# Patient Record
Sex: Male | Born: 1985 | Race: Asian | Hispanic: No | Smoking: Former smoker
Health system: Southern US, Community
[De-identification: ages and names within clinical notes are randomized; demographics above are authoritative.]

## PROBLEM LIST (undated history)

## (undated) DIAGNOSIS — G4730 Sleep apnea, unspecified: Secondary | ICD-10-CM

## (undated) DIAGNOSIS — I639 Cerebral infarction, unspecified: Secondary | ICD-10-CM

## (undated) DIAGNOSIS — T79A19A Traumatic compartment syndrome of unspecified upper extremity, initial encounter: Secondary | ICD-10-CM

## (undated) HISTORY — PX: DECOMPRESSION FASCIOTOMY FOREARM: SUR401

---

## 2008-04-13 ENCOUNTER — Emergency Department (HOSPITAL_COMMUNITY): Admission: EM | Admit: 2008-04-13 | Discharge: 2008-04-13 | Payer: Self-pay | Admitting: Family Medicine

## 2014-11-30 ENCOUNTER — Ambulatory Visit (HOSPITAL_COMMUNITY)
Admission: RE | Admit: 2014-11-30 | Discharge: 2014-11-30 | Disposition: A | Discharge status: Home / Self Care | Payer: BLUE CROSS/BLUE SHIELD | Source: Ambulatory Visit | Attending: Nurse Practitioner | Admitting: Nurse Practitioner

## 2014-11-30 DIAGNOSIS — Z31448 Encounter for other genetic testing of male for procreative management: Secondary | ICD-10-CM | POA: Insufficient documentation

## 2014-12-20 ENCOUNTER — Other Ambulatory Visit (HOSPITAL_COMMUNITY): Payer: Self-pay | Admitting: Maternal and Fetal Medicine

## 2015-03-14 ENCOUNTER — Emergency Department (HOSPITAL_COMMUNITY)
Admission: EM | Admit: 2015-03-14 | Discharge: 2015-03-14 | Disposition: A | Discharge status: Home / Self Care | Payer: BLUE CROSS/BLUE SHIELD | Source: Home / Self Care | Attending: Family Medicine | Admitting: Family Medicine

## 2015-03-14 ENCOUNTER — Encounter (HOSPITAL_COMMUNITY): Payer: Self-pay | Admitting: Family Medicine

## 2015-03-14 DIAGNOSIS — J029 Acute pharyngitis, unspecified: Secondary | ICD-10-CM

## 2015-03-14 DIAGNOSIS — J039 Acute tonsillitis, unspecified: Secondary | ICD-10-CM | POA: Diagnosis not present

## 2015-03-14 LAB — POCT RAPID STREP A: STREPTOCOCCUS, GROUP A SCREEN (DIRECT): NEGATIVE

## 2015-03-14 MED ORDER — AMOXICILLIN 500 MG PO CAPS: 500.0000 mg | ORAL_CAPSULE | Freq: Two times a day (BID) | ORAL | Status: DC

## 2015-03-14 MED ORDER — DEXAMETHASONE SODIUM PHOSPHATE 10 MG/ML IJ SOLN: 10.0000 mg | Freq: Once | INTRAMUSCULAR | Status: DC

## 2015-03-14 NOTE — ED Notes (Signed)
Patient complains of sore throat and fever for the last four days Patient rates his pain an "8" on the pain scale

## 2015-03-14 NOTE — Discharge Instructions (Signed)
You have an infection that will require antibiotics Please take them as prescribed You were given an shot of steroids to help your throat swelling and pain Please use tylenol or ibuprofen for pain and fever.  ????? ???????????? ?????? ????? ???? ?? ??????? ? ????????? ????? ????????? ??? ????? ??????? ?? ?? ?? ????? ????? ??????? ? ???? ??? ???? ?????? ???? ????? ? ????? ???? tylenol ?? ibuprofen ?????? ??????????

## 2015-03-14 NOTE — ED Provider Notes (Signed)
CSN: [id number]     Arrival date & time 03/14/15  1739 History   First MD Initiated Contact with Patient 03/14/15 1817     Chief Complaint  Patient presents with  . Sore Throat   (Consider location/radiation/quality/duration/timing/severity/associated sxs/prior Treatment) HPI  Throat pain: 4 days. Fevers. No runny. Has not tried any medicine. No expansion some dysphagia with food. Poor by mouth intake over the last several days. General ill feeling. Denies any chest pain, shortness of breath, palpitations, runny nose, cough, congestion. Symptoms are constant and getting worse.  History reviewed. No pertinent past medical history. History reviewed. No pertinent past surgical history. Family History  Problem Relation Age of Onset  . Diabetes Neg Hx   . Cancer Neg Hx   . Heart failure Neg Hx   . Hyperlipidemia Neg Hx   . Hypertension Neg Hx    Social History  Substance Use Topics  . Smoking status: Current Every Day Smoker -- 0.01 packs/day  . Smokeless tobacco: None  . Alcohol Use: None    Review of Systems Per HPI with all other pertinent systems negative.   Allergies  Review of patient's allergies indicates not on file.  Home Medications   Prior to Admission medications   Medication Sig Start Date End Date Taking? Authorizing Provider  amoxicillin (AMOXIL) 500 MG capsule Take 1 capsule (500 mg total) by mouth 2 (two) times daily. 03/14/15   Ozella Rocks, MD   BP 131/80 mmHg  Pulse 97  Temp(Src) 101.8 F (38.8 C) (Oral)  Resp 15  SpO2 99% Physical Exam Physical Exam  Constitutional: oriented to person, place, and time. appears well-developed and well-nourished. No distress.  HENT:  Head: Normocephalic and atraumatic.  Eyes: EOMI. PERRL.  Edematous posterior pharyngeal cavity and tonsils 2+ with exudate. Neck: Normal range of motion.  Cardiovascular: RRR, no m/r/g, 2+ distal pulses,  Pulmonary/Chest: Effort normal and breath sounds normal. No respiratory  distress.  Abdominal: Soft. Bowel sounds are normal. NonTTP, no distension.  Musculoskeletal: Normal range of motion. Non ttp, no effusion.  Neurological: alert and oriented to person, place, and time.  Skin: Skin is warm. No rash noted. non diaphoretic.  Psychiatric: normal mood and affect. behavior is normal. Judgment and thought content normal.   ED Course  Procedures (including critical care time) Labs Review Labs Reviewed - No data to display  Imaging Review No results found.   MDM   1. Tonsillitis   2. Pharyngitis    Decadron 10 mg IM given in clinic. Rapid strep negative. We'll send strep culture. Start amoxicillin.    Ozella Rocks, MD 03/14/15 [phone]

## 2015-03-17 LAB — CULTURE, GROUP A STREP: STREP A CULTURE: NEGATIVE

## 2015-03-18 NOTE — ED Notes (Signed)
Final report of strep test negaivw

## 2017-08-07 ENCOUNTER — Encounter (HOSPITAL_COMMUNITY): Payer: Self-pay

## 2017-08-07 ENCOUNTER — Emergency Department (HOSPITAL_COMMUNITY)
Admission: EM | Admit: 2017-08-07 | Discharge: 2017-08-07 | Disposition: A | Discharge status: Home / Self Care | Payer: Commercial Managed Care - PPO | Attending: Emergency Medicine | Admitting: Emergency Medicine

## 2017-08-07 ENCOUNTER — Emergency Department (HOSPITAL_COMMUNITY): Payer: Commercial Managed Care - PPO

## 2017-08-07 DIAGNOSIS — M549 Dorsalgia, unspecified: Secondary | ICD-10-CM | POA: Diagnosis present

## 2017-08-07 DIAGNOSIS — R3915 Urgency of urination: Secondary | ICD-10-CM | POA: Insufficient documentation

## 2017-08-07 DIAGNOSIS — R300 Dysuria: Secondary | ICD-10-CM | POA: Diagnosis not present

## 2017-08-07 DIAGNOSIS — R1032 Left lower quadrant pain: Secondary | ICD-10-CM | POA: Diagnosis not present

## 2017-08-07 DIAGNOSIS — F17210 Nicotine dependence, cigarettes, uncomplicated: Secondary | ICD-10-CM | POA: Insufficient documentation

## 2017-08-07 DIAGNOSIS — R8290 Unspecified abnormal findings in urine: Secondary | ICD-10-CM | POA: Insufficient documentation

## 2017-08-07 DIAGNOSIS — R3912 Poor urinary stream: Secondary | ICD-10-CM | POA: Insufficient documentation

## 2017-08-07 DIAGNOSIS — N200 Calculus of kidney: Secondary | ICD-10-CM | POA: Insufficient documentation

## 2017-08-07 LAB — URINALYSIS, ROUTINE W REFLEX MICROSCOPIC
Bilirubin Urine: NEGATIVE
Glucose, UA: NEGATIVE mg/dL
Ketones, ur: NEGATIVE mg/dL
Leukocytes, UA: NEGATIVE
NITRITE: NEGATIVE
PROTEIN: 100 mg/dL — AB
SPECIFIC GRAVITY, URINE: 1.025 (ref 1.005–1.030)
Squamous Epithelial / LPF: NONE SEEN
pH: 6 (ref 5.0–8.0)

## 2017-08-07 LAB — CBC WITH DIFFERENTIAL/PLATELET
BASOS ABS: 0.1 10*3/uL (ref 0.0–0.1)
Basophils Relative: 0 %
EOS PCT: 2 %
Eosinophils Absolute: 0.3 10*3/uL (ref 0.0–0.7)
HEMATOCRIT: 42.8 % (ref 39.0–52.0)
HEMOGLOBIN: 14.7 g/dL (ref 13.0–17.0)
LYMPHS ABS: 3 10*3/uL (ref 0.7–4.0)
LYMPHS PCT: 19 %
MCH: 28.8 pg (ref 26.0–34.0)
MCHC: 34.3 g/dL (ref 30.0–36.0)
MCV: 83.9 fL (ref 78.0–100.0)
Monocytes Absolute: 1.7 10*3/uL — ABNORMAL HIGH (ref 0.1–1.0)
Monocytes Relative: 11 %
NEUTROS ABS: 10.5 10*3/uL — AB (ref 1.7–7.7)
NEUTROS PCT: 68 %
Platelets: 288 10*3/uL (ref 150–400)
RBC: 5.1 MIL/uL (ref 4.22–5.81)
RDW: 12.9 % (ref 11.5–15.5)
WBC: 15.6 10*3/uL — AB (ref 4.0–10.5)

## 2017-08-07 LAB — COMPREHENSIVE METABOLIC PANEL
ALK PHOS: 88 U/L (ref 38–126)
ALT: 99 U/L — AB (ref 17–63)
AST: 56 U/L — ABNORMAL HIGH (ref 15–41)
Albumin: 3.7 g/dL (ref 3.5–5.0)
Anion gap: 7 (ref 5–15)
BILIRUBIN TOTAL: 1.2 mg/dL (ref 0.3–1.2)
BUN: 13 mg/dL (ref 6–20)
CALCIUM: 8.8 mg/dL — AB (ref 8.9–10.3)
CO2: 24 mmol/L (ref 22–32)
CREATININE: 1.5 mg/dL — AB (ref 0.61–1.24)
Chloride: 105 mmol/L (ref 101–111)
Glucose, Bld: 115 mg/dL — ABNORMAL HIGH (ref 65–99)
Potassium: 3.6 mmol/L (ref 3.5–5.1)
Sodium: 136 mmol/L (ref 135–145)
Total Protein: 8.1 g/dL (ref 6.5–8.1)

## 2017-08-07 LAB — LIPASE, BLOOD: LIPASE: 28 U/L (ref 11–51)

## 2017-08-07 IMAGING — CT CT RENAL STONE PROTOCOL
2 of 4 series · 16 of 46 positions shown, 18 images · non-contrast
Comparison: None.

CLINICAL DATA: Left back/ abdominal pain, dysuria

EXAM:
CT ABDOMEN AND PELVIS WITHOUT CONTRAST
TECHNIQUE: Multidetector CT imaging of the abdomen and pelvis was performed
following the standard protocol without IV contrast.

[Series 3: stone study 5.0 i30f 2 · axial · 0.72mm/px · z∈[+957,+1367]mm · 13 of 90 slices shown, 15 images]
[im 4/90  soft-tissue]
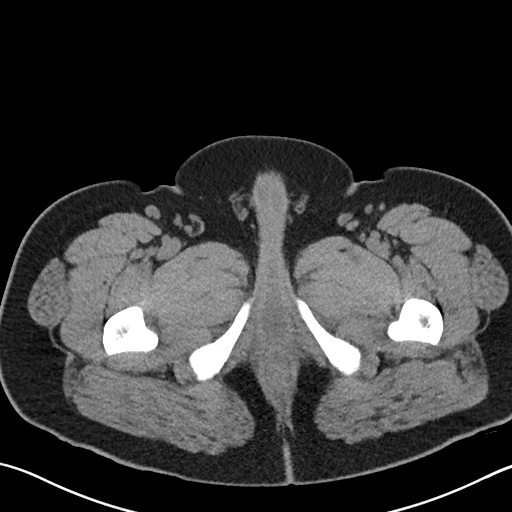
[im 4/90  bone]
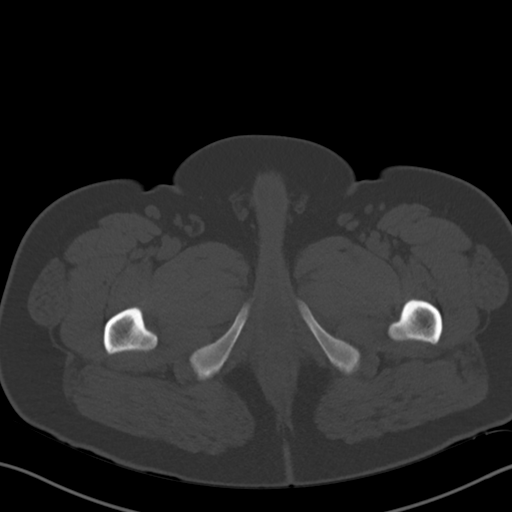
[im 11/90  soft-tissue]
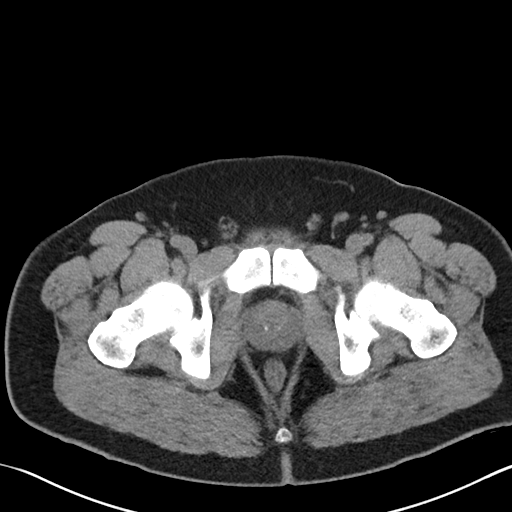
[im 18/90  soft-tissue]
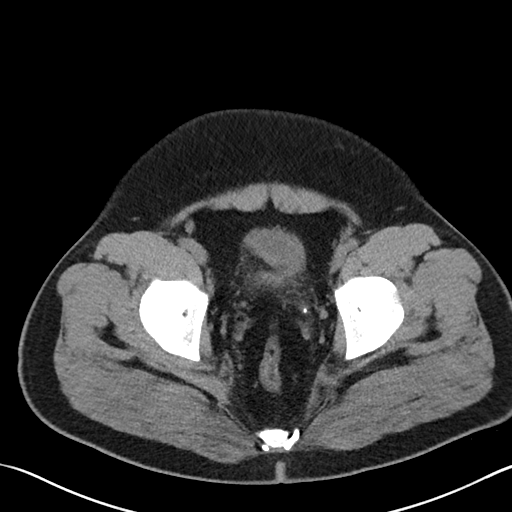
[im 24/90  soft-tissue]
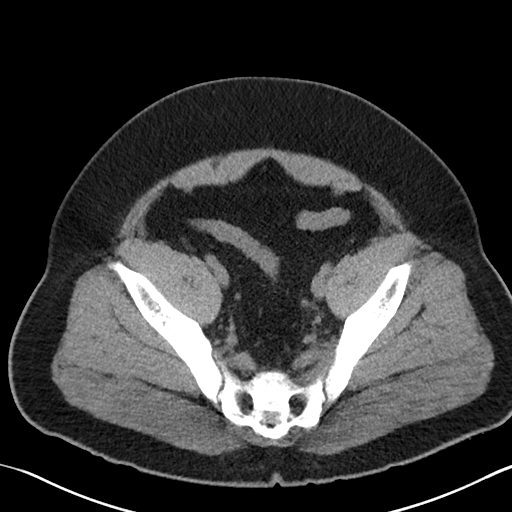
[im 31/90  soft-tissue]
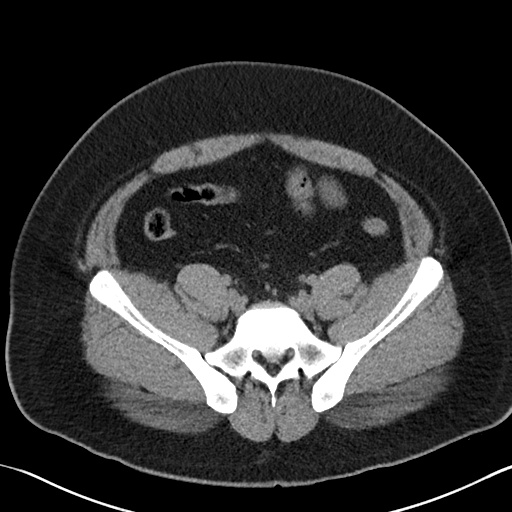
[im 38/90  soft-tissue]
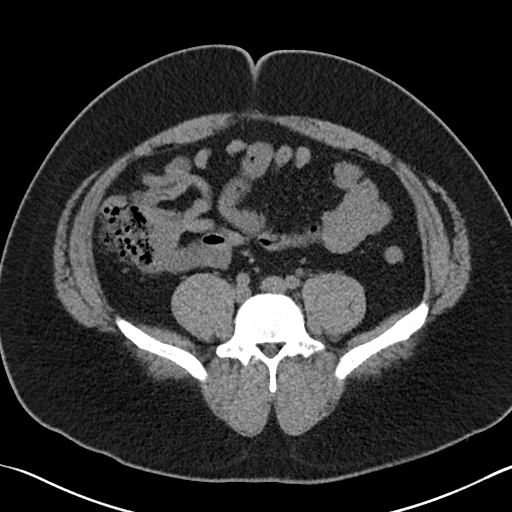
[im 45/90  soft-tissue]
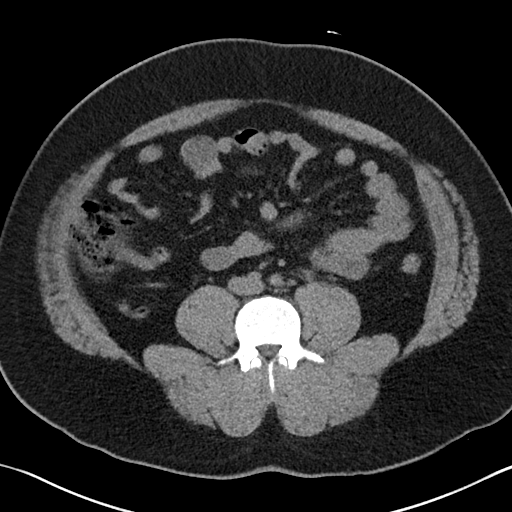
[im 52/90  soft-tissue]
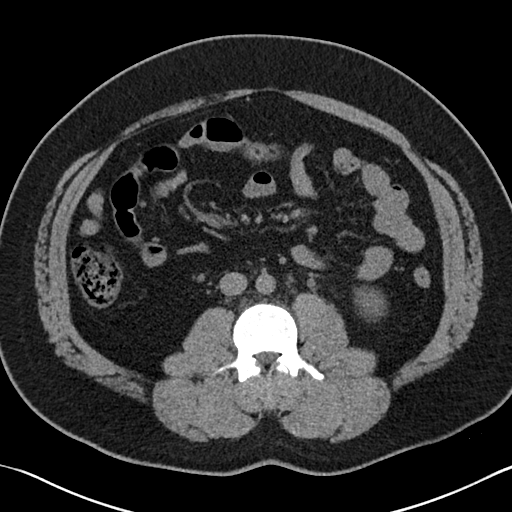
[im 59/90  soft-tissue]
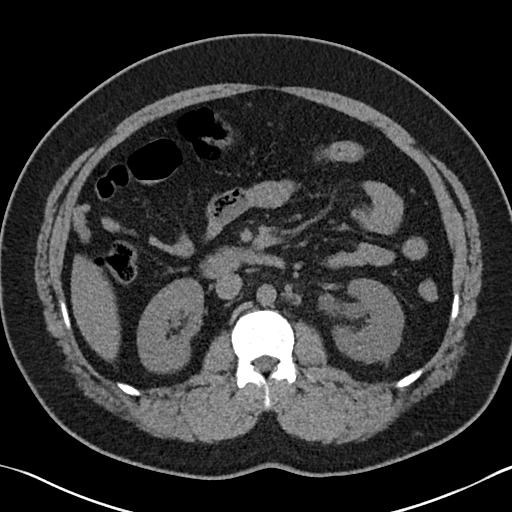
[im 59/90  bone]
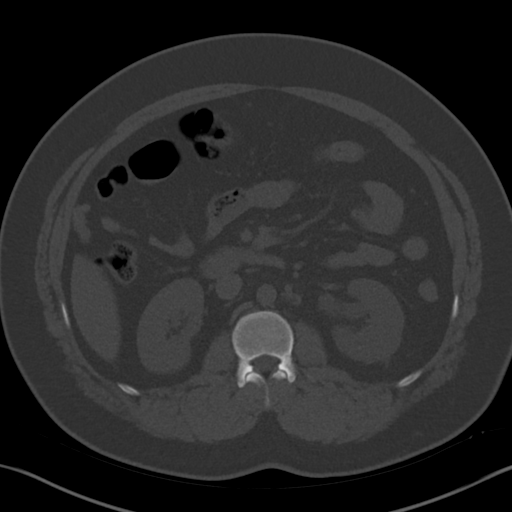
[im 66/90  soft-tissue]
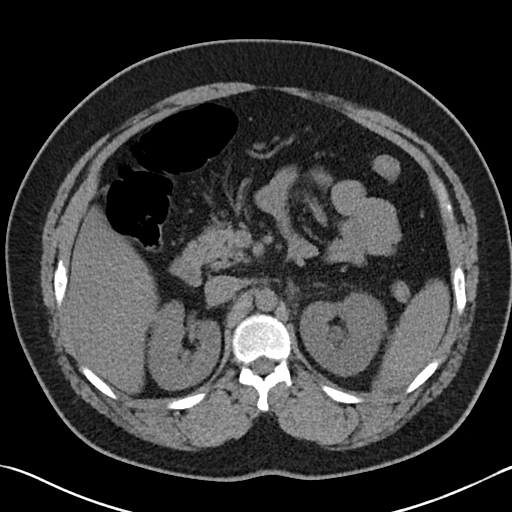
[im 72/90  soft-tissue]
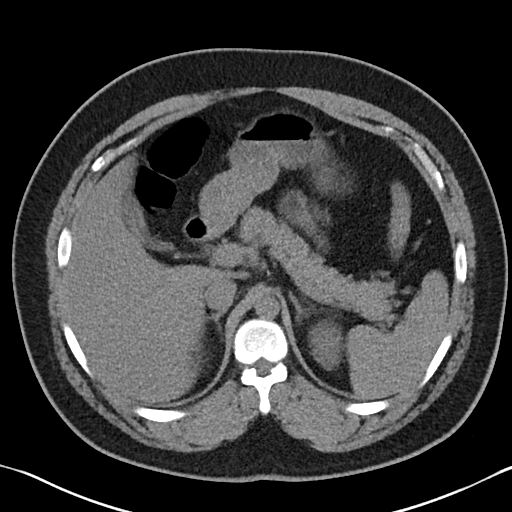
[im 79/90  soft-tissue]
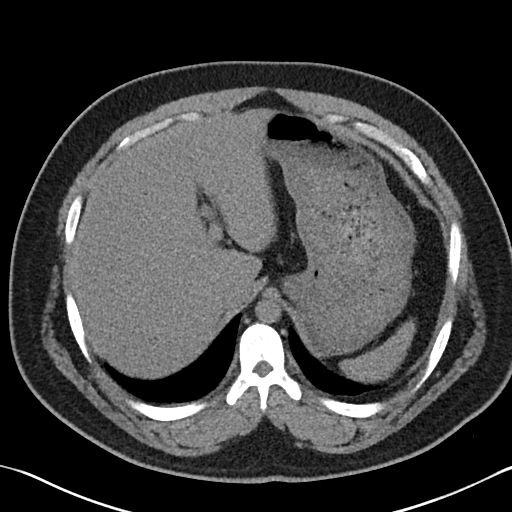
[im 86/90  soft-tissue]
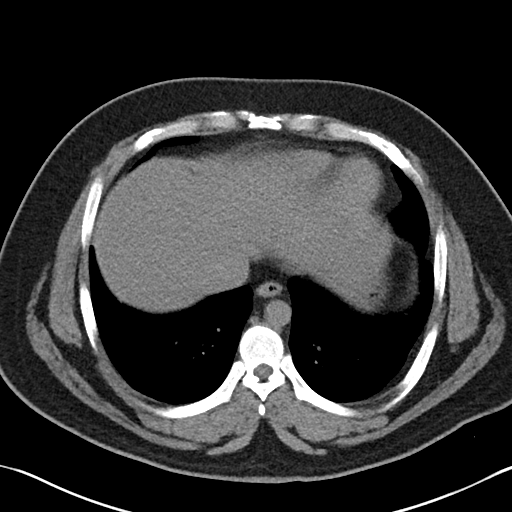

[Series 6: coronal soft tissue · coronal · 0.70mm/px · 3 of 101 slices shown]
[im 34/101  soft-tissue]
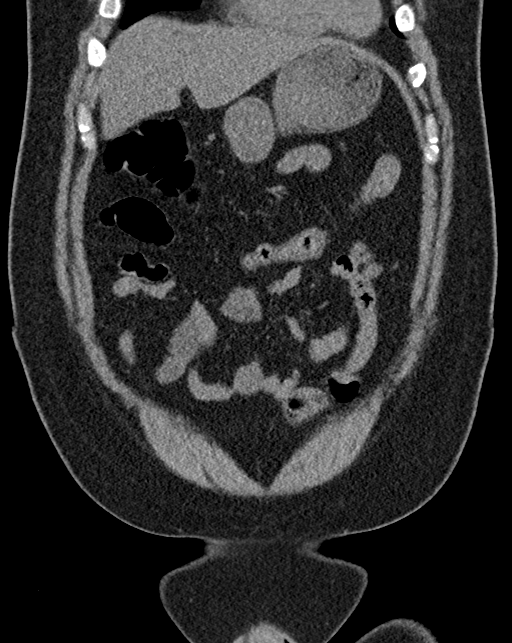
[im 45/101  soft-tissue]
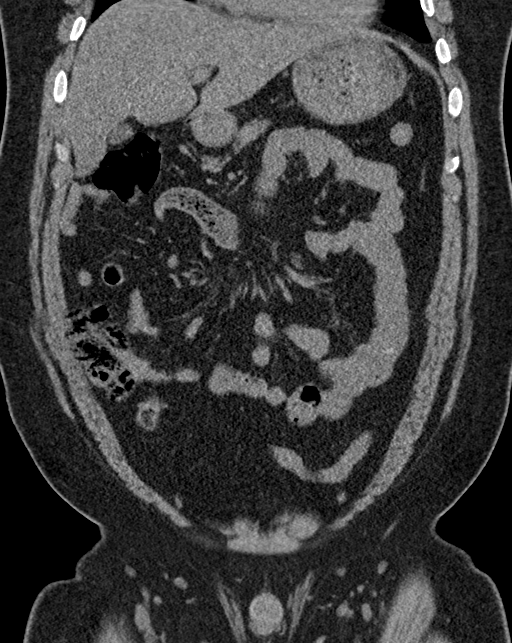
[im 56/101  soft-tissue]
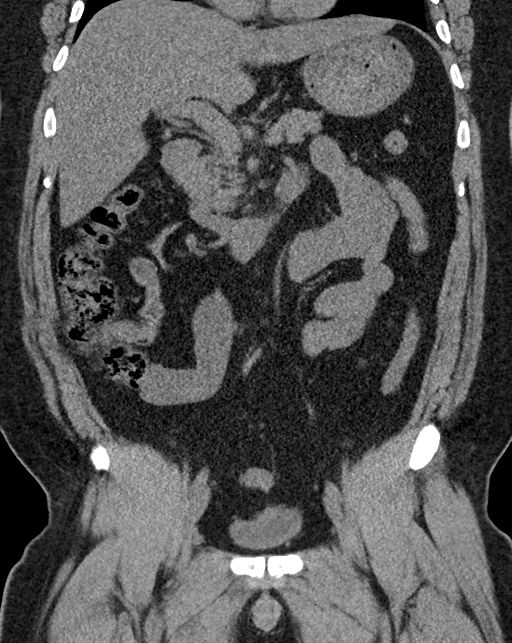

[16 of 46 positions shown; findings below may reference images not displayed]

FINDINGS: Lower chest: Lung bases are clear.

Hepatobiliary: Mild hepatic steatosis.

Gallbladder is underdistended but unremarkable. No intrahepatic or
extrahepatic ductal dilatation.

Pancreas: Within normal limits.

Spleen: Within normal limits.

Adrenals/Urinary Tract: Adrenal glands are within normal limits.

Right kidney is within normal limits.

Left kidney is notable for mild left hydroureteronephrosis.
Associated 3 mm distal left ureteral calculus just above the UVJ
(coronal image 70).

Bladder is within normal limits.

Stomach/Bowel: Stomach is within normal limits.

No evidence of bowel obstruction.

Normal appendix (series 3/ image 49).

Vascular/Lymphatic: No evidence of abdominal aortic aneurysm.

No suspicious abdominopelvic lymphadenopathy.

Reproductive: Prostate is unremarkable.

Other: No abdominopelvic ascites.

Musculoskeletal: Visualized osseous structures are within normal
limits.
IMPRESSION: 3 mm distal left ureteral calculus just above the UVJ. Mild left
hydroureteronephrosis.

## 2017-08-07 MED ORDER — IBUPROFEN 800 MG PO TABS: 800.0000 mg | ORAL_TABLET | Freq: Three times a day (TID) | ORAL | 0 refills | Status: DC | PRN

## 2017-08-07 MED ORDER — TAMSULOSIN HCL 0.4 MG PO CAPS: 0.4000 mg | ORAL_CAPSULE | Freq: Every day | ORAL | 0 refills | Status: DC

## 2017-08-07 MED ORDER — IBUPROFEN 800 MG PO TABS
800.0000 mg | ORAL_TABLET | Freq: Once | ORAL | Status: AC
  Administered 2017-08-07: 800 mg via ORAL
  Filled 2017-08-07: qty 1

## 2017-08-07 NOTE — ED Triage Notes (Signed)
Patient complains of dysuria and back pain for greater than 1 week. States that urine looks dark, denies fever

## 2017-08-07 NOTE — ED Notes (Signed)
Declined W/C at D/C and was escorted to lobby by RN. 

## 2017-08-07 NOTE — ED Provider Notes (Signed)
[name] EMERGENCY DEPARTMENT Provider Note   CSN: [id number] Arrival date & time: 08/07/17  1101     History   Chief Complaint Chief Complaint  Patient presents with  . Dysuria    HPI Tzvi Economou is a 32 y.o. male without significant past medical history who presents to the ED with complaint of intermittent left back pain that radiates to the LLQ of the abdomen x 1 week. Patient states he has had 3 episodes of pain.  States the pain is in the left mid back that radiates to the left lower quadrant of the abdomen.  Describes the pain as a burning sensation.  Has improved previously with ibuprofen.  States that when he is having the pain he has associated urinary urgency and difficulty initiating urine stream. Mild dysuria at times as well. Notes urine to be darker in color. No other significant alleviating/aggravating factors. Denies fever, chills, N/V/D, constipation, blood in stool, testicular pain/swelling, or penile discharge. Translator used throughout Audiological scientist.   HPI  History reviewed. No pertinent past medical history.  There are no active problems to display for this patient.   History reviewed. No pertinent surgical history.     Home Medications    Prior to Admission medications   Medication Sig Start Date End Date Taking? Authorizing Provider  amoxicillin (AMOXIL) 500 MG capsule Take 1 capsule (500 mg total) by mouth 2 (two) times daily. 03/14/15   Ozella Rocks, MD    Family History Family History  Problem Relation Age of Onset  . Diabetes Neg Hx   . Cancer Neg Hx   . Heart failure Neg Hx   . Hyperlipidemia Neg Hx   . Hypertension Neg Hx     Social History Social History   Tobacco Use  . Smoking status: Current Every Day Smoker    Packs/day: 0.01  Substance Use Topics  . Alcohol use: Not on file  . Drug use: Not on file     Allergies   Patient has no known allergies.   Review of Systems Review of Systems    Constitutional: Negative for chills and fever.  Respiratory: Negative for shortness of breath.   Cardiovascular: Negative for chest pain.  Gastrointestinal: Positive for abdominal pain. Negative for blood in stool, constipation, diarrhea, nausea and vomiting.  Genitourinary: Positive for difficulty urinating, dysuria, flank pain and urgency. Negative for discharge, frequency, penile pain, penile swelling, scrotal swelling and testicular pain.  All other systems reviewed and are negative.    Physical Exam Updated Vital Signs BP (!) 148/104 (BP Location: Right Arm)   Pulse 89   Temp 98.7 F (37.1 C) (Oral)   Resp 18   SpO2 99%   Physical Exam  Constitutional: He appears well-developed and well-nourished. No distress.  HENT:  Head: Normocephalic and atraumatic.  Eyes: Conjunctivae are normal. Right eye exhibits no discharge. Left eye exhibits no discharge.  Cardiovascular: Normal rate and regular rhythm.  No murmur heard. Pulmonary/Chest: Breath sounds normal. No respiratory distress. He has no wheezes. He has no rales.  Abdominal: Soft. He exhibits no distension. There is tenderness (mild left suprapubic region). There is CVA tenderness (Left). There is no rebound, no guarding, no tenderness at McBurney's point and negative Murphy's sign.  Musculoskeletal:  Back: No midline tenderness.   Neurological: He is alert.  Clear speech.   Skin: Skin is warm and dry. No rash noted.  Psychiatric: He has a normal mood and affect. His behavior is normal.  Nursing note and vitals reviewed.    ED Treatments / Results  Labs Results for orders placed or performed during the hospital encounter of 08/07/17  Urinalysis, Routine w reflex microscopic- may I&O cath if menses  Result Value Ref Range   Color, Urine YELLOW YELLOW   APPearance CLEAR CLEAR   Specific Gravity, Urine 1.025 1.005 - 1.030   pH 6.0 5.0 - 8.0   Glucose, UA NEGATIVE NEGATIVE mg/dL   Hgb urine dipstick MODERATE (A)  NEGATIVE   Bilirubin Urine NEGATIVE NEGATIVE   Ketones, ur NEGATIVE NEGATIVE mg/dL   Protein, ur 161 (A) NEGATIVE mg/dL   Nitrite NEGATIVE NEGATIVE   Leukocytes, UA NEGATIVE NEGATIVE   RBC / HPF 6-30 0 - 5 RBC/hpf   WBC, UA 0-5 0 - 5 WBC/hpf   Bacteria, UA RARE (A) NONE SEEN   Squamous Epithelial / LPF NONE SEEN NONE SEEN   Mucus PRESENT   CBC with Differential  Result Value Ref Range   WBC 15.6 (H) 4.0 - 10.5 K/uL   RBC 5.10 4.22 - 5.81 MIL/uL   Hemoglobin 14.7 13.0 - 17.0 g/dL   HCT 09.6 04.5 - 40.9 %   MCV 83.9 78.0 - 100.0 fL   MCH 28.8 26.0 - 34.0 pg   MCHC 34.3 30.0 - 36.0 g/dL   RDW 81.1 91.4 - 78.2 %   Platelets 288 150 - 400 K/uL   Neutrophils Relative % 68 %   Neutro Abs 10.5 (H) 1.7 - 7.7 K/uL   Lymphocytes Relative 19 %   Lymphs Abs 3.0 0.7 - 4.0 K/uL   Monocytes Relative 11 %   Monocytes Absolute 1.7 (H) 0.1 - 1.0 K/uL   Eosinophils Relative 2 %   Eosinophils Absolute 0.3 0.0 - 0.7 K/uL   Basophils Relative 0 %   Basophils Absolute 0.1 0.0 - 0.1 K/uL  Comprehensive metabolic panel  Result Value Ref Range   Sodium 136 135 - 145 mmol/L   Potassium 3.6 3.5 - 5.1 mmol/L   Chloride 105 101 - 111 mmol/L   CO2 24 22 - 32 mmol/L   Glucose, Bld 115 (H) 65 - 99 mg/dL   BUN 13 6 - 20 mg/dL   Creatinine, Ser 9.56 (H) 0.61 - 1.24 mg/dL   Calcium 8.8 (L) 8.9 - 10.3 mg/dL   Total Protein 8.1 6.5 - 8.1 g/dL   Albumin 3.7 3.5 - 5.0 g/dL   AST 56 (H) 15 - 41 U/L   ALT 99 (H) 17 - 63 U/L   Alkaline Phosphatase 88 38 - 126 U/L   Total Bilirubin 1.2 0.3 - 1.2 mg/dL   GFR calc non Af Amer >60 >60 mL/min   GFR calc Af Amer >60 >60 mL/min   Anion gap 7 5 - 15  Lipase, blood  Result Value Ref Range   Lipase 28 11 - 51 U/L   EKG  EKG Interpretation None       Radiology Ct Renal Stone Study  Result Date: 08/07/2017 CLINICAL DATA:  Left back/ abdominal pain, dysuria EXAM: CT ABDOMEN AND PELVIS WITHOUT CONTRAST TECHNIQUE: Multidetector CT imaging of the abdomen and  pelvis was performed following the standard protocol without IV contrast. COMPARISON:  None. FINDINGS: Lower chest: Lung bases are clear. Hepatobiliary: Mild hepatic steatosis. Gallbladder is underdistended but unremarkable. No intrahepatic or extrahepatic ductal dilatation. Pancreas: Within normal limits. Spleen: Within normal limits. Adrenals/Urinary Tract: Adrenal glands are within normal limits. Right kidney is within normal limits. Left kidney is notable  for mild left hydroureteronephrosis. Associated 3 mm distal left ureteral calculus just above the UVJ (coronal image 70). Bladder is within normal limits. Stomach/Bowel: Stomach is within normal limits. No evidence of bowel obstruction. Normal appendix (series 3/ image 49). Vascular/Lymphatic: No evidence of abdominal aortic aneurysm. No suspicious abdominopelvic lymphadenopathy. Reproductive: Prostate is unremarkable. Other: No abdominopelvic ascites. Musculoskeletal: Visualized osseous structures are within normal limits. IMPRESSION: 3 mm distal left ureteral calculus just above the UVJ. Mild left hydroureteronephrosis. Electronically Signed   By: Charline BillsSriyesh  Krishnan M.D.   On: 08/07/2017 16:01    Procedures Procedures (including critical care time)  Medications Ordered in ED Medications - No data to display   Initial Impression / Assessment and Plan / ED Course  I have reviewed the triage vital signs and the nursing notes.  Pertinent labs & imaging results that were available during my care of the patient were reviewed by me and considered in my medical decision making (see chart for details).     Patient presents with left flank pain radiating to LLQ and urinary sxs. Patient is nontoxic appearing, vitals WNL with exception of elevated BP. UA obtained revealed moderate amount of hgb, protein, and rare bacteria- negative for nitrites or leukocytes. Given symptoms and UA will evaluate with CT renal study and screening labs.   Patient has been  diagnosed with a Kidney Stone via CT, mild left hydrouretronephrosis. Labs notable for leukocytosis at 15.6 and creatinine of 1.5. No previous labs to compare to. Also of note patient's BP elevated, not currently seeing a primary care provider, unsure if this is his baseline. Patient is afebrile, no nitrites/leukocytes or WBC casts on UA, tolerating PO, doubt pyelonephritis at this time. Leukocytosis nonspecific at this time. Creatinine elevation at 1.5, difficult to assess if this is acute abnormality given no prior labs available, and if acute elevation if this is related to nephrolithiasis vs. Blood pressure. Will need re-evaluation of creatinie and blood pressure. Findings and plan of care discussed with supervising physician Dr. Effie ShyWentz, recommended DC home with urology and PCP follow up for re-evaluation. Will prescribe Flomax and Ibuprofen and provider strainer. I discussed results, treatment plan, need for PCP and urology follow-up, and return precautions with the patient and his wife. Provided opportunity for questions, patient and his wife confirmed understanding and are in agreement with plan.    Final Clinical Impressions(s) / ED Diagnoses   Final diagnoses:  Kidney stone    ED Discharge Orders        Ordered    tamsulosin (FLOMAX) 0.4 MG CAPS capsule  Daily after supper     08/07/17 1658    ibuprofen (ADVIL,MOTRIN) 800 MG tablet  Every 8 hours PRN     08/07/17 [id number]1658       Quron Ruddy, WalnutSamantha R, PA-C 08/07/17 1801    Arby BarrettePfeiffer, Marcy, MD 08/16/17 1432

## 2017-08-07 NOTE — Discharge Instructions (Addendum)
You were seen in the emergency department and diagnosed with a kidney stone. Your lab work was consistent with this. Of note your creatinine (which is a measure of kidney function) was elevated today. This is something that will need to be rechecked by the urologist or your primary care provider.   Your blood pressure was also elevated today- you will need to have this rechecked as well.   I have given you to prescription medications:  - Ibuprofen-this is a nonsteroidal anti-inflammatory medication that will help with pain.  You may take this once every 8 hours as needed for discomfort.  Take this with food as it can cause stomach upset and at worst stomach bleeding.  Do not take other NSAIDs with this such as Motrin, Advil, or Aleve as it will further irritate your stomach.  You may supplement with Tylenol. - Flomax- this is a medication to help you urinate more to help pass the stone. Take this daily with supper.   We have provided you with a strainer, be sure to urinate into the strainer each time you have to urinate in order to see when you pass the stone.  You will need to follow-up with urology in the next 3-5 days for reevaluation, there information is in the your discharge instructions for contact information for a urologist in [place].  Call them on Monday in order to make an appointment for sometime this week.  I have also provided you with our community clinic information follow-up with the community clinic in 1 week for a reevaluation of your blood pressure and your kidney function.  If you have a primary care provider you may follow-up with them as well.  Return to the emergency department for any new or worsening symptoms such as worsening pain not controlled by ibuprofen, nausea/vomiting, fever, chills or any other concerns.

## 2019-03-24 ENCOUNTER — Emergency Department (HOSPITAL_COMMUNITY)
Admission: EM | Admit: 2019-03-24 | Discharge: 2019-03-24 | Disposition: A | Discharge status: Home / Self Care | Payer: Commercial Managed Care - PPO | Attending: Emergency Medicine | Admitting: Emergency Medicine

## 2019-03-24 ENCOUNTER — Encounter (HOSPITAL_COMMUNITY): Payer: Self-pay

## 2019-03-24 ENCOUNTER — Other Ambulatory Visit: Payer: Self-pay

## 2019-03-24 DIAGNOSIS — F17200 Nicotine dependence, unspecified, uncomplicated: Secondary | ICD-10-CM | POA: Diagnosis not present

## 2019-03-24 DIAGNOSIS — L309 Dermatitis, unspecified: Secondary | ICD-10-CM | POA: Diagnosis not present

## 2019-03-24 DIAGNOSIS — Z79899 Other long term (current) drug therapy: Secondary | ICD-10-CM | POA: Insufficient documentation

## 2019-03-24 DIAGNOSIS — R21 Rash and other nonspecific skin eruption: Secondary | ICD-10-CM | POA: Diagnosis present

## 2019-03-24 MED ORDER — HYDROCORTISONE 1 % EX CREA: TOPICAL_CREAM | CUTANEOUS | 0 refills | Status: DC

## 2019-03-24 NOTE — Discharge Instructions (Addendum)
Please read instructions below. You can apply a thin layer of the cream to your rash every 12 hours for itching. Take 25mg  of benadryl every 6 hours, and 20mg  of pepcid every 12 hours for rash and itching.  Schedule an appointment with your PCP to follow up on your visit today. Return to the ER immediately for feeling your throat closing, swelling of your lips or tongue, difficulty breathing, or new or concerning symptoms.

## 2019-03-24 NOTE — ED Triage Notes (Signed)
Pt comes to ED with c/o of allergic reaction. Pt presents with erhythema and hives on back and L leg. States this has been getting worse for one week. Pt states he has not taken any medications.

## 2019-03-24 NOTE — ED Provider Notes (Signed)
[facility] EMERGENCY DEPARTMENT Provider Note   CSN: [id number] Arrival date & time: 03/24/19  1628     History   Chief Complaint Chief Complaint  Patient presents with  . Allergic Reaction    HPI Ernest [name] is a 33 y.o. male without significant past medical history, presenting to the emergency department with complaint of itchy rash to his bilateral extremities and back for 1 week.  He states it is itchy though when he scratches it too much it feels like a burning sensation.  He states sometimes he changes his laundry detergent though does not believe he changed it anything recently.  He denies any similar rash in the past.  Denies fevers or new medications.  No insect bites or tick bites.  Denies difficulty breathing or swallowing.  He has not used any medications or tried any therapies for his symptoms.     The history is provided by the patient.    History reviewed. No pertinent past medical history.  There are no active problems to display for this patient.   History reviewed. No pertinent surgical history.      Home Medications    Prior to Admission medications   Medication Sig Start Date End Date Taking? Authorizing Provider  hydrocortisone cream 1 % Apply to affected area 2 times daily 03/24/19   Robinson, [place] N, PA-C  ibuprofen (ADVIL,MOTRIN) 800 MG tablet Take 1 tablet (800 mg total) by mouth every 8 (eight) hours as needed. 08/07/17   Petrucelli, Samantha R, PA-C  tamsulosin (FLOMAX) 0.4 MG CAPS capsule Take 1 capsule (0.4 mg total) by mouth daily after supper. 08/07/17   Petrucelli, Glynda Jaeger, PA-C    Family History Family History  Problem Relation Age of Onset  . Diabetes Neg Hx   . Cancer Neg Hx   . Heart failure Neg Hx   . Hyperlipidemia Neg Hx   . Hypertension Neg Hx     Social History Social History   Tobacco Use  . Smoking status: Current Every Day Smoker    Packs/day: 0.01  . Smokeless tobacco: Never Used  Substance  Use Topics  . Alcohol use: Never    Frequency: Never  . Drug use: Never     Allergies   Patient has no known allergies.   Review of Systems Review of Systems  Skin: Positive for rash.  All other systems reviewed and are negative.    Physical Exam Updated Vital Signs BP (!) 160/106 (BP Location: Right Arm)   Pulse 95   Temp 98.5 F (36.9 C) (Oral)   Resp 16   SpO2 100%   Physical Exam Vitals signs and nursing note reviewed.  Constitutional:      General: He is not in acute distress.    Appearance: He is well-developed. He is not ill-appearing.  HENT:     Head: Normocephalic and atraumatic.     Mouth/Throat:     Mouth: Mucous membranes are moist.     Pharynx: Oropharynx is clear.  Eyes:     Conjunctiva/sclera: Conjunctivae normal.  Cardiovascular:     Rate and Rhythm: Normal rate.  Pulmonary:     Effort: Pulmonary effort is normal. No respiratory distress.  Skin:    Comments: Large areas of maculopapular rash to the bilateral low back, the left inner thigh, and the right posterior knee.  No tenderness.  No vesicles or desquamation.  No bulla or petechia.  There is some mild overlying excoriation.  Neurological:  Mental Status: He is alert.  Psychiatric:        Mood and Affect: Mood normal.        Behavior: Behavior normal.      ED Treatments / Results  Labs (all labs ordered are listed, but only abnormal results are displayed) Labs Reviewed - No data to display  EKG None  Radiology No results found.  Procedures Procedures (including critical care time)  Medications Ordered in ED Medications - No data to display   Initial Impression / Assessment and Plan / ED Course  I have reviewed the triage vital signs and the nursing notes.  Pertinent labs & imaging results that were available during my care of the patient were reviewed by me and considered in my medical decision making (see chart for details).        Patient with rash suspicious  for allergic versus contact dermatitis.  No recent known aggravating factors.  No symptoms of anaphylaxis.  No recent insect bites or new medications.  Patient has tried no over-the-counter remedies prior to ED visit today.  He is in no distress.  No signs of secondary bacterial or fungal infection.  Recommend treatment with Benadryl, Pepcid, and will prescribe topical steroid cream for symptoms.  PCP follow-up.  Return precautions discussed.  Discussed results, findings, treatment and follow up. Patient advised of return precautions. Patient verbalized understanding and agreed with plan.   Final Clinical Impressions(s) / ED Diagnoses   Final diagnoses:  Dermatitis    ED Discharge Orders         Ordered    hydrocortisone cream 1 %     03/24/19 1700           Robinson, [place] N, PA-C 03/24/19 1701    Raeford RazorKohut, Stephen, MD 03/24/19 1705

## 2019-03-24 NOTE — ED Notes (Signed)
Patient verbalizes understanding of discharge instructions. Opportunity for questioning and answers were provided. Armband removed by staff, pt discharged from ED ambulatory.   

## 2021-05-22 ENCOUNTER — Observation Stay (HOSPITAL_COMMUNITY): Payer: 59

## 2021-05-22 ENCOUNTER — Emergency Department (HOSPITAL_COMMUNITY): Payer: 59

## 2021-05-22 ENCOUNTER — Encounter (HOSPITAL_COMMUNITY): Payer: Self-pay | Admitting: Emergency Medicine

## 2021-05-22 ENCOUNTER — Inpatient Hospital Stay (HOSPITAL_COMMUNITY)
Admission: EM | Admit: 2021-05-22 | Discharge: 2021-05-23 | DRG: 065 | Disposition: A | Discharge status: Home / Self Care | Payer: 59 | Attending: Internal Medicine | Admitting: Internal Medicine

## 2021-05-22 ENCOUNTER — Other Ambulatory Visit: Payer: Self-pay

## 2021-05-22 DIAGNOSIS — I6389 Other cerebral infarction: Secondary | ICD-10-CM | POA: Diagnosis not present

## 2021-05-22 DIAGNOSIS — D72829 Elevated white blood cell count, unspecified: Secondary | ICD-10-CM | POA: Diagnosis present

## 2021-05-22 DIAGNOSIS — R569 Unspecified convulsions: Secondary | ICD-10-CM | POA: Diagnosis not present

## 2021-05-22 DIAGNOSIS — G4733 Obstructive sleep apnea (adult) (pediatric): Secondary | ICD-10-CM | POA: Diagnosis present

## 2021-05-22 DIAGNOSIS — I63511 Cerebral infarction due to unspecified occlusion or stenosis of right middle cerebral artery: Secondary | ICD-10-CM

## 2021-05-22 DIAGNOSIS — Z28310 Unvaccinated for covid-19: Secondary | ICD-10-CM | POA: Diagnosis not present

## 2021-05-22 DIAGNOSIS — E7800 Pure hypercholesterolemia, unspecified: Secondary | ICD-10-CM | POA: Diagnosis not present

## 2021-05-22 DIAGNOSIS — F17200 Nicotine dependence, unspecified, uncomplicated: Secondary | ICD-10-CM | POA: Diagnosis not present

## 2021-05-22 DIAGNOSIS — F17210 Nicotine dependence, cigarettes, uncomplicated: Secondary | ICD-10-CM | POA: Diagnosis present

## 2021-05-22 DIAGNOSIS — E785 Hyperlipidemia, unspecified: Secondary | ICD-10-CM | POA: Diagnosis present

## 2021-05-22 DIAGNOSIS — R29810 Facial weakness: Secondary | ICD-10-CM | POA: Diagnosis present

## 2021-05-22 DIAGNOSIS — R29701 NIHSS score 1: Secondary | ICD-10-CM | POA: Diagnosis not present

## 2021-05-22 DIAGNOSIS — R7401 Elevation of levels of liver transaminase levels: Secondary | ICD-10-CM | POA: Diagnosis present

## 2021-05-22 DIAGNOSIS — I639 Cerebral infarction, unspecified: Secondary | ICD-10-CM | POA: Diagnosis present

## 2021-05-22 DIAGNOSIS — R29700 NIHSS score 0: Secondary | ICD-10-CM | POA: Diagnosis present

## 2021-05-22 DIAGNOSIS — L83 Acanthosis nigricans: Secondary | ICD-10-CM | POA: Diagnosis present

## 2021-05-22 DIAGNOSIS — G8194 Hemiplegia, unspecified affecting left nondominant side: Secondary | ICD-10-CM | POA: Diagnosis present

## 2021-05-22 DIAGNOSIS — K760 Fatty (change of) liver, not elsewhere classified: Secondary | ICD-10-CM | POA: Diagnosis present

## 2021-05-22 DIAGNOSIS — G459 Transient cerebral ischemic attack, unspecified: Secondary | ICD-10-CM

## 2021-05-22 DIAGNOSIS — Z6841 Body Mass Index (BMI) 40.0 and over, adult: Secondary | ICD-10-CM

## 2021-05-22 DIAGNOSIS — T79A0XA Compartment syndrome, unspecified, initial encounter: Secondary | ICD-10-CM | POA: Diagnosis not present

## 2021-05-22 DIAGNOSIS — R29702 NIHSS score 2: Secondary | ICD-10-CM | POA: Diagnosis not present

## 2021-05-22 DIAGNOSIS — R4702 Dysphasia: Secondary | ICD-10-CM | POA: Diagnosis present

## 2021-05-22 DIAGNOSIS — Z79899 Other long term (current) drug therapy: Secondary | ICD-10-CM

## 2021-05-22 DIAGNOSIS — Z20822 Contact with and (suspected) exposure to covid-19: Secondary | ICD-10-CM | POA: Diagnosis present

## 2021-05-22 HISTORY — DX: Traumatic compartment syndrome of unspecified upper extremity, initial encounter: T79.A19A

## 2021-05-22 LAB — CBC
HCT: 43.8 % (ref 39.0–52.0)
Hemoglobin: 14.7 g/dL (ref 13.0–17.0)
MCH: 28.7 pg (ref 26.0–34.0)
MCHC: 33.6 g/dL (ref 30.0–36.0)
MCV: 85.5 fL (ref 80.0–100.0)
Platelets: 289 10*3/uL (ref 150–400)
RBC: 5.12 MIL/uL (ref 4.22–5.81)
RDW: 13.7 % (ref 11.5–15.5)
WBC: 11.7 10*3/uL — ABNORMAL HIGH (ref 4.0–10.5)
nRBC: 0 % (ref 0.0–0.2)

## 2021-05-22 LAB — DIFFERENTIAL
Abs Immature Granulocytes: 0.08 10*3/uL — ABNORMAL HIGH (ref 0.00–0.07)
Basophils Absolute: 0.1 10*3/uL (ref 0.0–0.1)
Basophils Relative: 1 %
Eosinophils Absolute: 0.6 10*3/uL — ABNORMAL HIGH (ref 0.0–0.5)
Eosinophils Relative: 5 %
Immature Granulocytes: 1 %
Lymphocytes Relative: 37 %
Lymphs Abs: 4.3 10*3/uL — ABNORMAL HIGH (ref 0.7–4.0)
Monocytes Absolute: 1 10*3/uL (ref 0.1–1.0)
Monocytes Relative: 9 %
Neutro Abs: 5.7 10*3/uL (ref 1.7–7.7)
Neutrophils Relative %: 47 %

## 2021-05-22 LAB — PROTIME-INR
INR: 1 (ref 0.8–1.2)
Prothrombin Time: 12.9 seconds (ref 11.4–15.2)

## 2021-05-22 LAB — COMPREHENSIVE METABOLIC PANEL
ALT: 73 U/L — ABNORMAL HIGH (ref 0–44)
AST: 53 U/L — ABNORMAL HIGH (ref 15–41)
Albumin: 3.4 g/dL — ABNORMAL LOW (ref 3.5–5.0)
Alkaline Phosphatase: 92 U/L (ref 38–126)
Anion gap: 8 (ref 5–15)
BUN: 8 mg/dL (ref 6–20)
CO2: 23 mmol/L (ref 22–32)
Calcium: 8.7 mg/dL — ABNORMAL LOW (ref 8.9–10.3)
Chloride: 104 mmol/L (ref 98–111)
Creatinine, Ser: 0.98 mg/dL (ref 0.61–1.24)
GFR, Estimated: >60 mL/min (ref >60)
Glucose, Bld: 109 mg/dL — ABNORMAL HIGH (ref 70–99)
Potassium: 3.8 mmol/L (ref 3.5–5.1)
Sodium: 135 mmol/L (ref 135–145)
Total Bilirubin: 0.7 mg/dL (ref 0.3–1.2)
Total Protein: 7.3 g/dL (ref 6.5–8.1)

## 2021-05-22 LAB — LIPID PANEL
Cholesterol: 186 mg/dL (ref 0–200)
HDL: 48 mg/dL (ref >40)
LDL Cholesterol: 120 mg/dL — ABNORMAL HIGH (ref 0–99)
Total CHOL/HDL Ratio: 3.9 RATIO
Triglycerides: 88 mg/dL (ref <150)
VLDL: 18 mg/dL (ref 0–40)

## 2021-05-22 LAB — I-STAT CHEM 8, ED
BUN: 7 mg/dL (ref 6–20)
Calcium, Ion: 1.08 mmol/L — ABNORMAL LOW (ref 1.15–1.40)
Chloride: 104 mmol/L (ref 98–111)
Creatinine, Ser: 0.9 mg/dL (ref 0.61–1.24)
Glucose, Bld: 107 mg/dL — ABNORMAL HIGH (ref 70–99)
HCT: 45 % (ref 39.0–52.0)
Hemoglobin: 15.3 g/dL (ref 13.0–17.0)
Potassium: 3.7 mmol/L (ref 3.5–5.1)
Sodium: 139 mmol/L (ref 135–145)
TCO2: 24 mmol/L (ref 22–32)

## 2021-05-22 LAB — TSH: TSH: 0.687 u[IU]/mL (ref 0.350–4.500)

## 2021-05-22 LAB — CBG MONITORING, ED: Glucose-Capillary: 108 mg/dL — ABNORMAL HIGH (ref 70–99)

## 2021-05-22 LAB — HEPATITIS PANEL, ACUTE
HCV Ab: NONREACTIVE
Hep A IgM: NONREACTIVE
Hep B C IgM: NONREACTIVE
Hepatitis B Surface Ag: NONREACTIVE

## 2021-05-22 LAB — RESP PANEL BY RT-PCR (FLU A&B, COVID) ARPGX2
Influenza A by PCR: NEGATIVE
Influenza B by PCR: NEGATIVE
SARS Coronavirus 2 by RT PCR: NEGATIVE

## 2021-05-22 LAB — HIV ANTIBODY (ROUTINE TESTING W REFLEX): HIV Screen 4th Generation wRfx: NONREACTIVE

## 2021-05-22 LAB — APTT: aPTT: 31 seconds (ref 24–36)

## 2021-05-22 LAB — ETHANOL: Alcohol, Ethyl (B): <10 mg/dL (ref <10)

## 2021-05-22 LAB — SEDIMENTATION RATE: Sed Rate: 24 mm/hr — ABNORMAL HIGH (ref 0–16)

## 2021-05-22 LAB — RPR: RPR Ser Ql: NONREACTIVE

## 2021-05-22 LAB — ACETAMINOPHEN LEVEL: Acetaminophen (Tylenol), Serum: <10 ug/mL — ABNORMAL LOW (ref 10–30)

## 2021-05-22 LAB — C-REACTIVE PROTEIN: CRP: 0.8 mg/dL (ref <1.0)

## 2021-05-22 IMAGING — CT CT ANGIO HEAD-NECK (W OR W/O PERF)
1 of 8 series · 6 of 33 positions shown · IV contrast (APPLIED)
Comparison: None.
COMPARISON: None.

Addendum:
CLINICAL DATA: Left arm weakness and transient shaking, possible
left facial droop

EXAM:
CT ANGIOGRAPHY HEAD AND NECK
TECHNIQUE: Multidetector CT imaging of the head and neck was performed using
the standard protocol during bolus administration of intravenous
contrast. Multiplanar CT image reconstructions and MIPs were
obtained to evaluate the vascular anatomy. Carotid stenosis
measurements (when applicable) are obtained utilizing NASCET
criteria, using the distal internal carotid diameter as the
denominator.
CONTRAST:  50mL OMNIPAQUE IOHEXOL 350 MG/ML SOLN

[Series 7: ax thins · axial · 0.42mm/px · z∈[-258,-6]mm · 6 of 353 slices shown]
[im 51/353  soft-tissue]
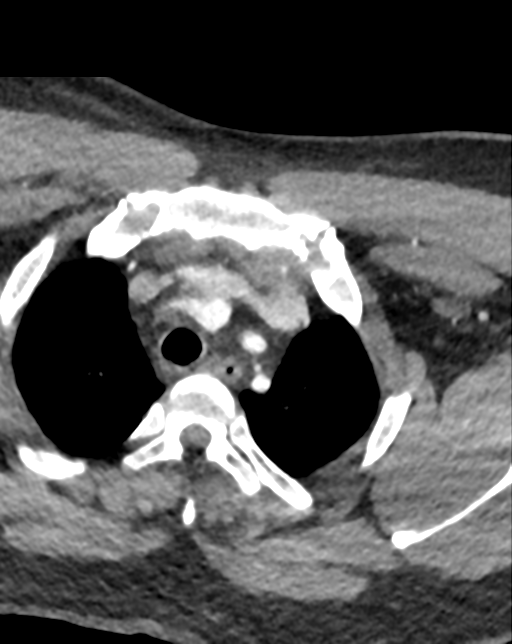
[im 101/353  bone]
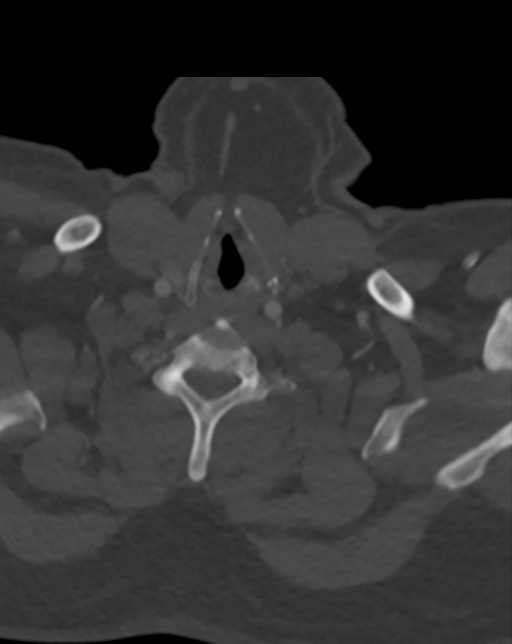
[im 151/353  soft-tissue]
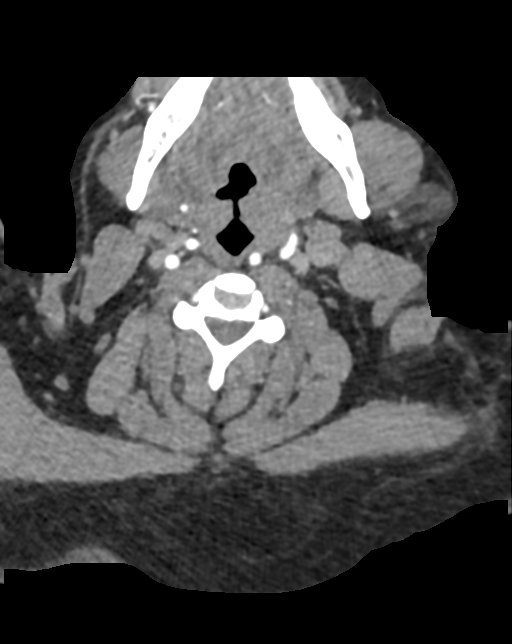
[im 202/353  bone]
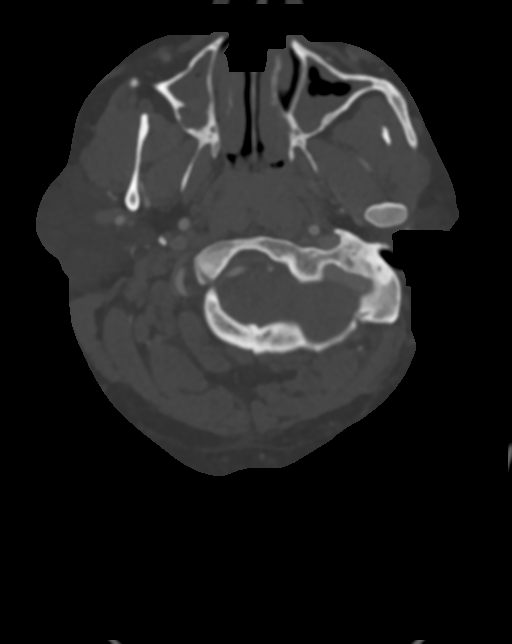
[im 252/353  soft-tissue]
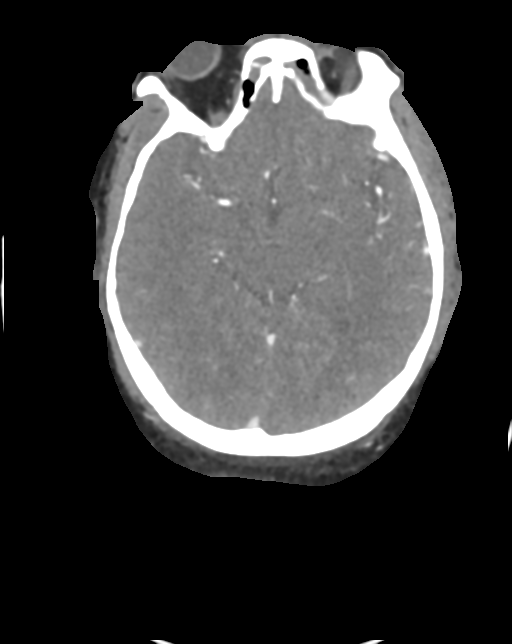
[im 302/353  bone]
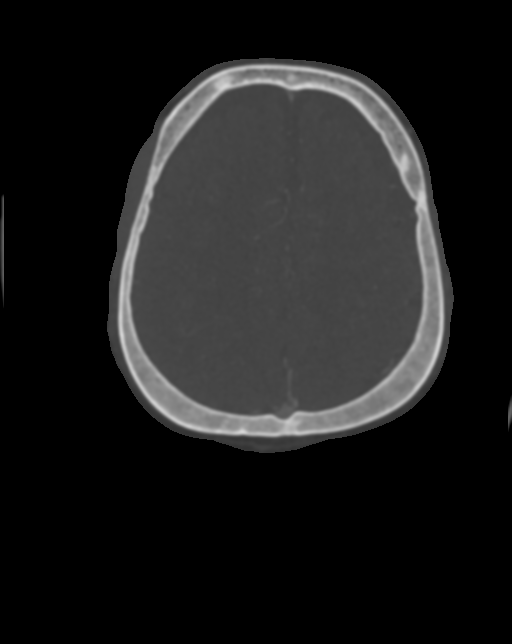

[6 of 33 positions shown; findings below may reference images not displayed]

FINDINGS: CT HEAD FINDINGS

Please see same-day CT stroke code

CTA NECK FINDINGS

Aortic arch: 4 vessel arch, with the origin of the left vertebral
artery originating from the arch imaged portion shows no evidence of
aneurysm or dissection. No significant stenosis of the major arch
vessel origins.

Right carotid system: No evidence of dissection, stenosis (50% or
greater) or occlusion.

Left carotid system: No evidence of dissection, stenosis (50% or
greater) or occlusion. Retropharyngeal course of the left CCA and
ICA.

Vertebral arteries: No evidence of dissection, stenosis (50% or
greater) or occlusion.

Skeleton: No acute osseous abnormality.

Other neck: Negative

Upper chest: Negative

Review of the MIP images confirms the above findings

CTA HEAD FINDINGS

Anterior circulation: Both internal carotid arteries are patent to
the termini, without stenosis or other abnormality. A1 segments
patent, with possible stenosis at the origin of the left A1. Normal
anterior communicating artery. Anterior cerebral arteries are patent
to their distal aspects. No M1 stenosis or occlusion. Normal MCA
bifurcations. Distal MCA branches perfused and symmetric.

Posterior circulation: Vertebral arteries widely patent to the
vertebrobasilar junction without stenosis, with the majority of the
left vertebral artery supplying the left PICA. Posterior inferior
cerebral arteries patent bilaterally. Basilar patent to its distal
aspect. Superior cerebral arteries patent bilaterally. Fetal origin
left PCA. PCAs well perfused to their distal aspects without
stenosis. The right posterior communicating artery is not
definitively visualized.

Venous sinuses: As permitted by contrast timing, patent.

Anatomic variants: None significant

Review of the MIP images confirms the above findings
IMPRESSION: 1. No hemodynamically significant stenosis in the neck.
2. No intracranial large vessel occlusion.

ADDENDUM:
Study discussed by telephone with Dr. ALEJOS at [DATE] on [DATE].

He questions abnormality of the distal Right MCA M1 segment on this
exam, and on review there is moderate to severe irregularity and
stenosis of the distal Right M1. See series 10, image 21 and series
9, image 78. That MCA bifurcation does remain patent, and Right MCA
M2 branches appear within normal limits.

No similar vessel irregularity or stenosis identified. No discrete
atherosclerosis elsewhere in the head or neck.

Unclear whether the abnormality of the Right MCA represents acute
thromboembolic disease versus vaso spasm or other.

The patient's NIH score is now approaching zero, and Dr. ALEJOS and I
discussed follow-up Brain MRI with intracranial MRA to re-evaluate.

*** End of Addendum ***
FINDINGS: CT HEAD FINDINGS

Please see same-day CT stroke code

CTA NECK FINDINGS

Aortic arch: 4 vessel arch, with the origin of the left vertebral
artery originating from the arch imaged portion shows no evidence of
aneurysm or dissection. No significant stenosis of the major arch
vessel origins.

Right carotid system: No evidence of dissection, stenosis (50% or
greater) or occlusion.

Left carotid system: No evidence of dissection, stenosis (50% or
greater) or occlusion. Retropharyngeal course of the left CCA and
ICA.

Vertebral arteries: No evidence of dissection, stenosis (50% or
greater) or occlusion.

Skeleton: No acute osseous abnormality.

Other neck: Negative

Upper chest: Negative

Review of the MIP images confirms the above findings

CTA HEAD FINDINGS

Anterior circulation: Both internal carotid arteries are patent to
the termini, without stenosis or other abnormality. A1 segments
patent, with possible stenosis at the origin of the left A1. Normal
anterior communicating artery. Anterior cerebral arteries are patent
to their distal aspects. No M1 stenosis or occlusion. Normal MCA
bifurcations. Distal MCA branches perfused and symmetric.

Posterior circulation: Vertebral arteries widely patent to the
vertebrobasilar junction without stenosis, with the majority of the
left vertebral artery supplying the left PICA. Posterior inferior
cerebral arteries patent bilaterally. Basilar patent to its distal
aspect. Superior cerebral arteries patent bilaterally. Fetal origin
left PCA. PCAs well perfused to their distal aspects without
stenosis. The right posterior communicating artery is not
definitively visualized.

Venous sinuses: As permitted by contrast timing, patent.

Anatomic variants: None significant

Review of the MIP images confirms the above findings
IMPRESSION: 1. No hemodynamically significant stenosis in the neck.
2. No intracranial large vessel occlusion.

## 2021-05-22 IMAGING — DX DG CHEST 1V PORT
1 series · 1 of 1 positions shown · non-contrast
Comparison: None.

CLINICAL DATA: TIA, weakness

EXAM:
PORTABLE CHEST 1 VIEW

[chest ap]
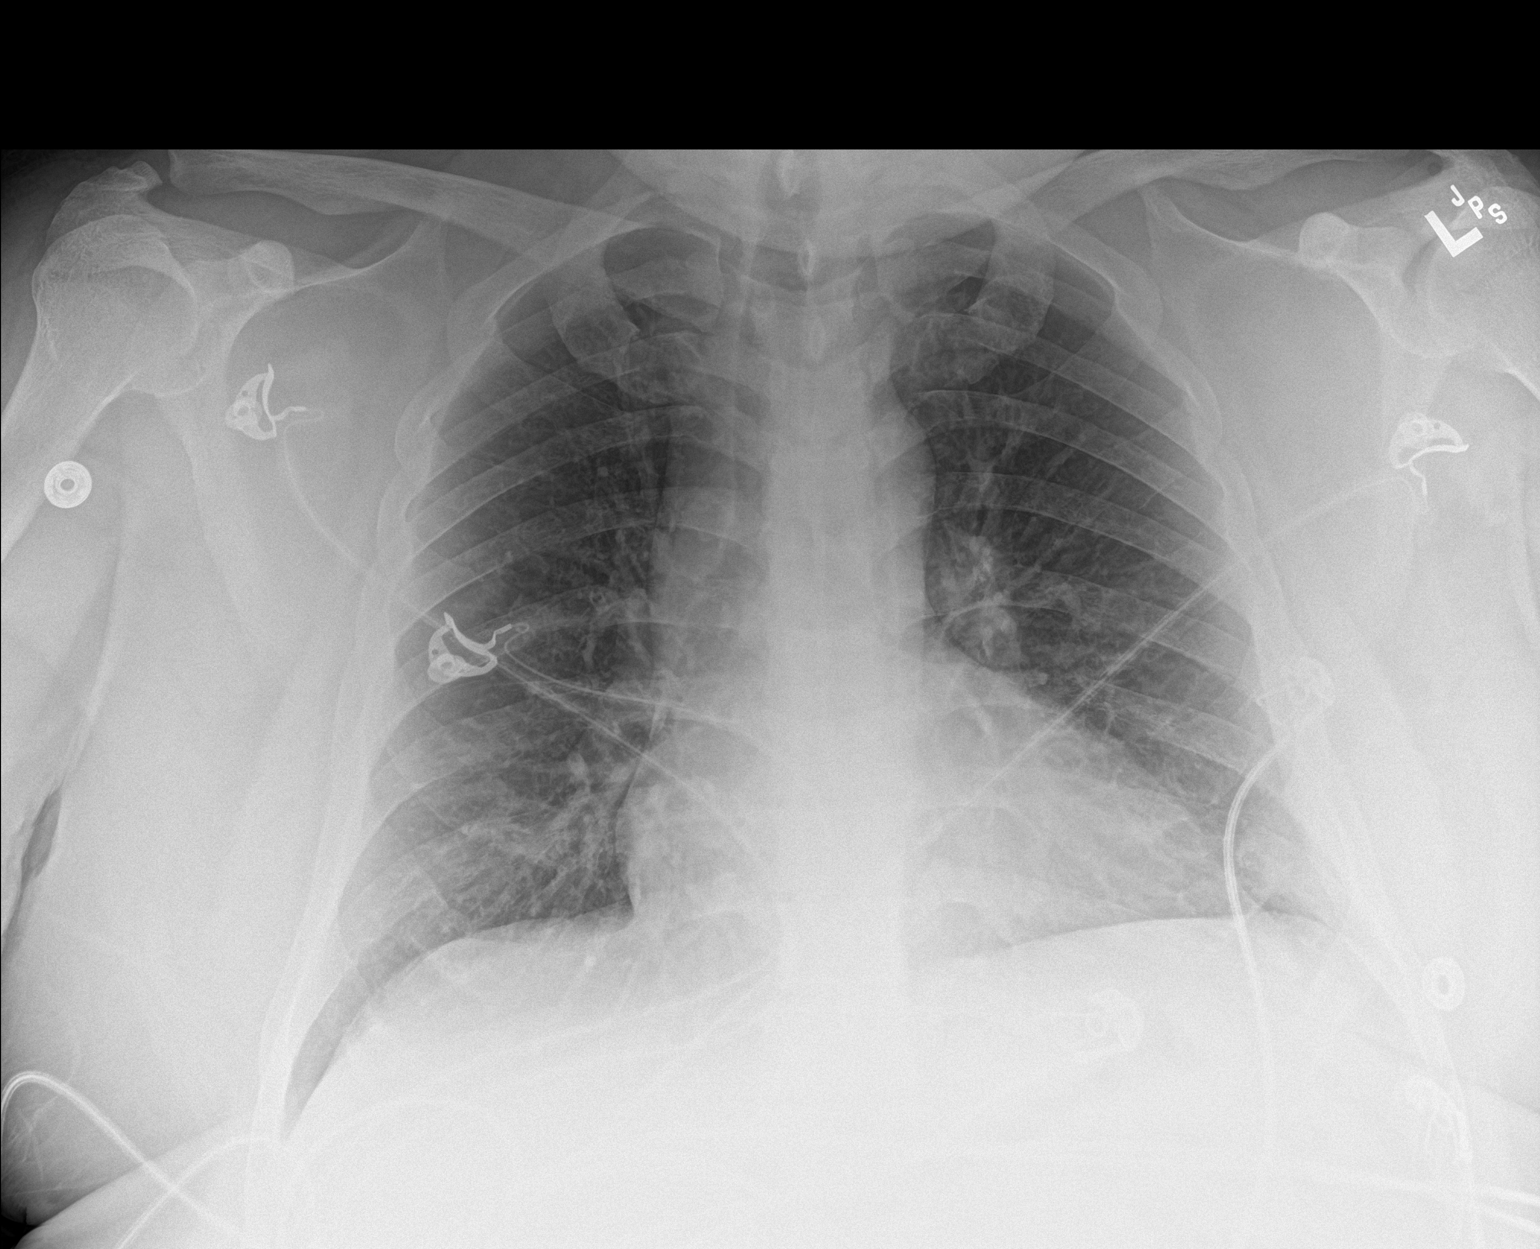

[1 of 1 positions shown; findings below may reference images not displayed]

FINDINGS: The cardiomediastinal silhouette is normal.

There is no focal consolidation or pulmonary edema. There is no
pleural effusion or pneumothorax.

There is no acute osseous abnormality.
IMPRESSION: No radiographic evidence of acute cardiopulmonary process.

## 2021-05-22 IMAGING — CT CT HEAD CODE STROKE
4 series · 16 of 47 positions shown, 18 images · non-contrast
Comparison: None.

CLINICAL DATA: Code stroke.  Left-sided weakness

EXAM:
CT HEAD WITHOUT CONTRAST
TECHNIQUE: Contiguous axial images were obtained from the base of the skull
through the vertex without intravenous contrast.

[Series 2: head wo · axial · 0.40mm/px · z∈[-100,+20]mm · 7 of 34 slices shown, 9 images]
[im 5/34  brain]
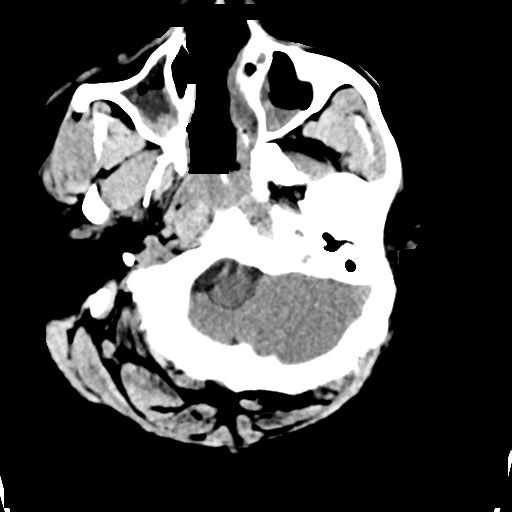
[im 5/34  bone]
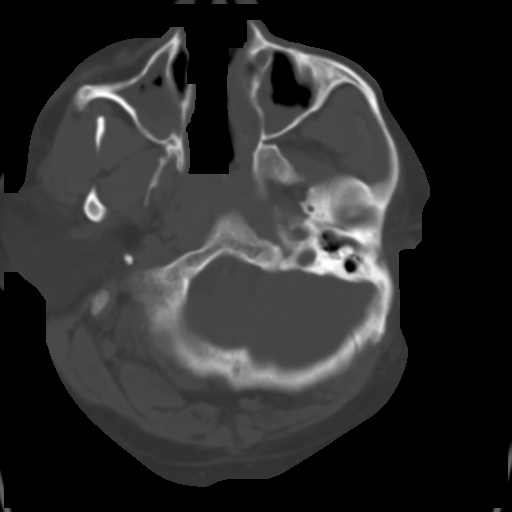
[im 9/34  brain]
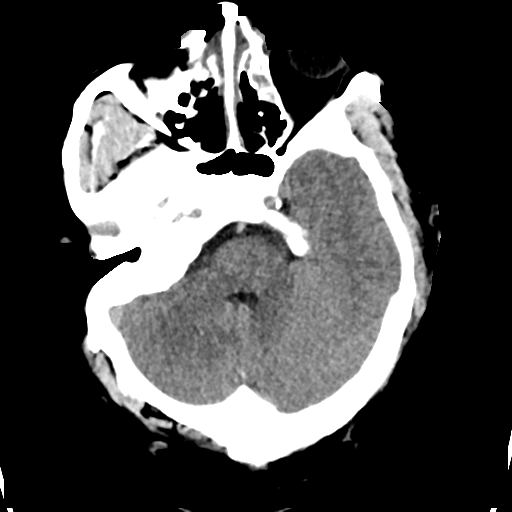
[im 13/34  brain]
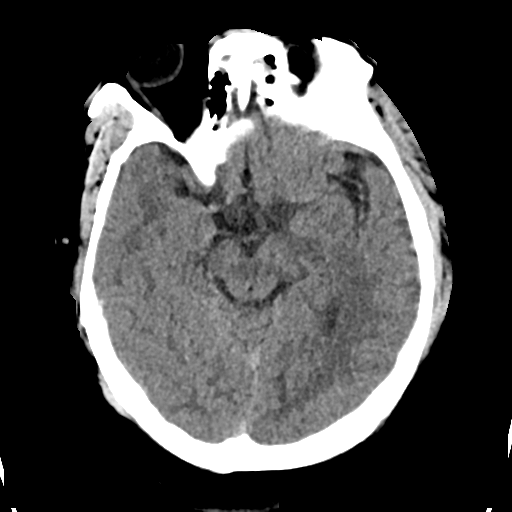
[im 17/34  brain]
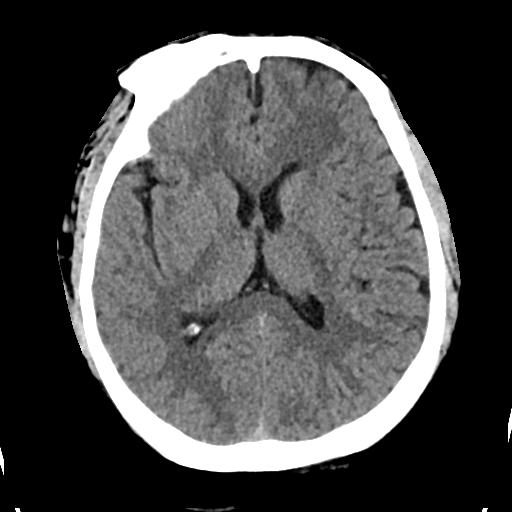
[im 21/34  brain]
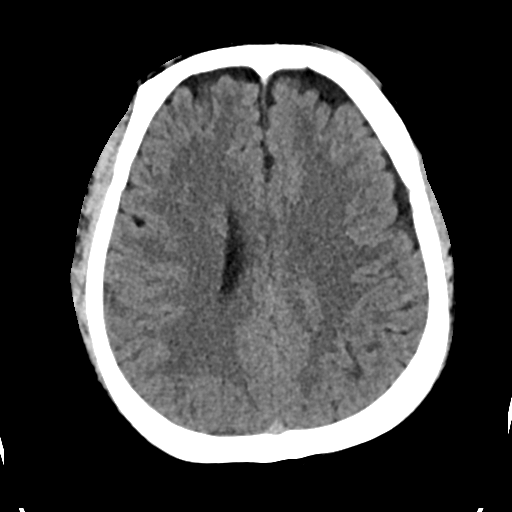
[im 21/34  bone]
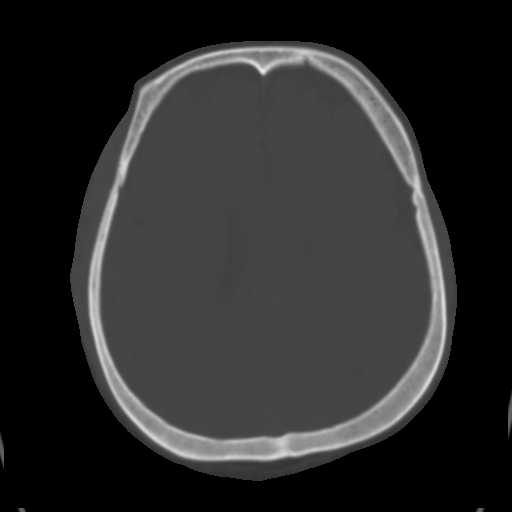
[im 25/34  brain]
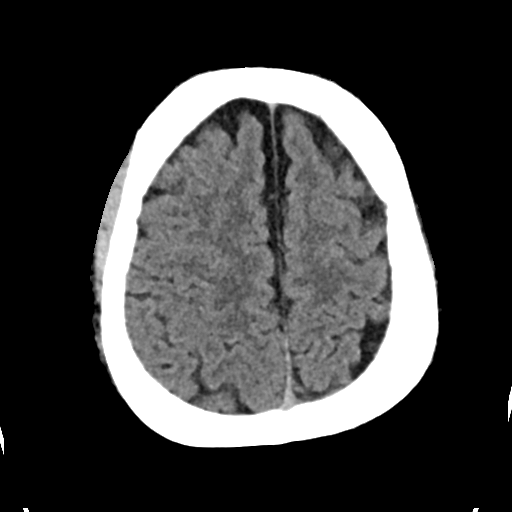
[im 29/34  brain]
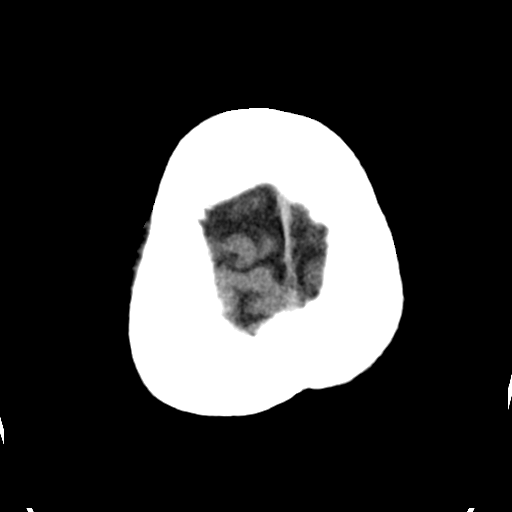

[Series 4: head bone · axial · 0.40mm/px · z∈[-104,-72]mm · 3 of 84 slices shown]
[im 9/84  bone]
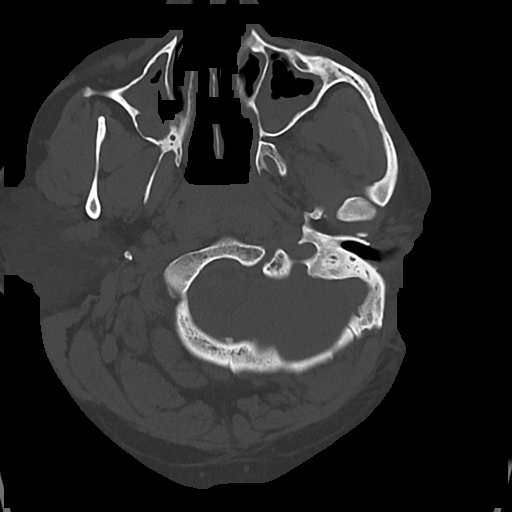
[im 17/84  bone]
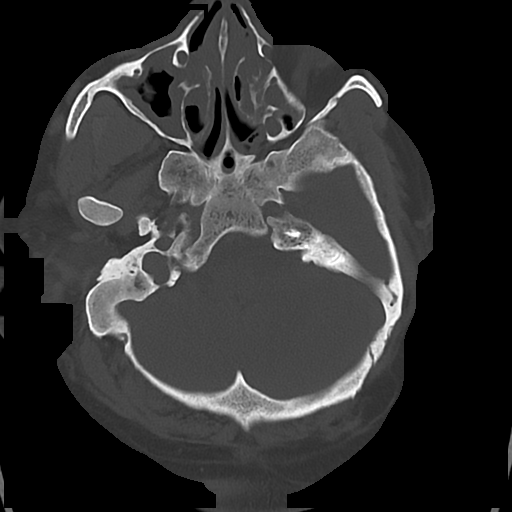
[im 25/84  bone]
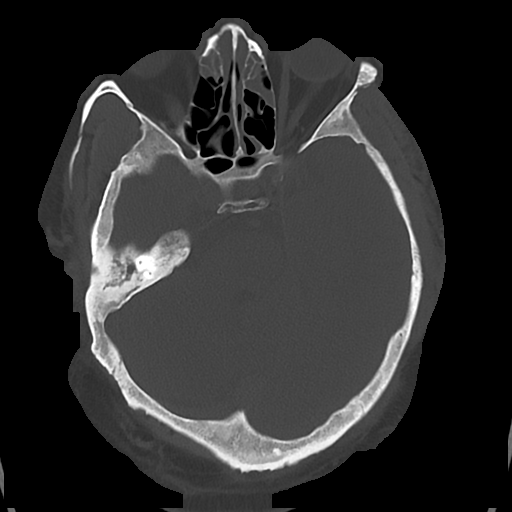

[Series 5: cor soft · coronal · 0.32mm/px · 3 of 69 slices shown]
[im 23/69  brain]
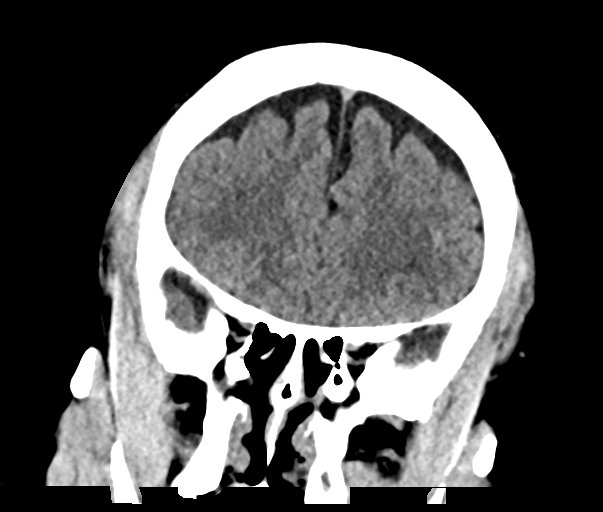
[im 31/69  brain]
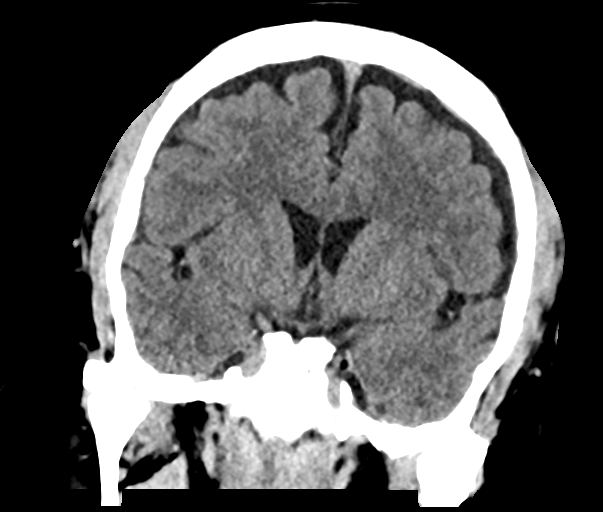
[im 38/69  brain]
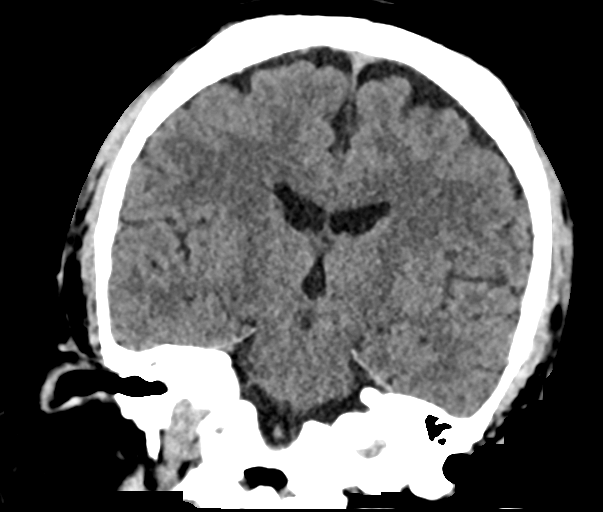

[Series 6: sag soft · sagittal · 0.33mm/px · 3 of 62 slices shown]
[im 21/62  brain]
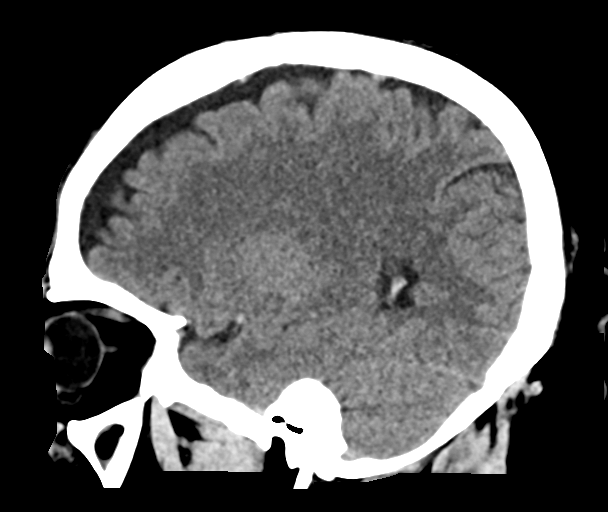
[im 31/62  brain]
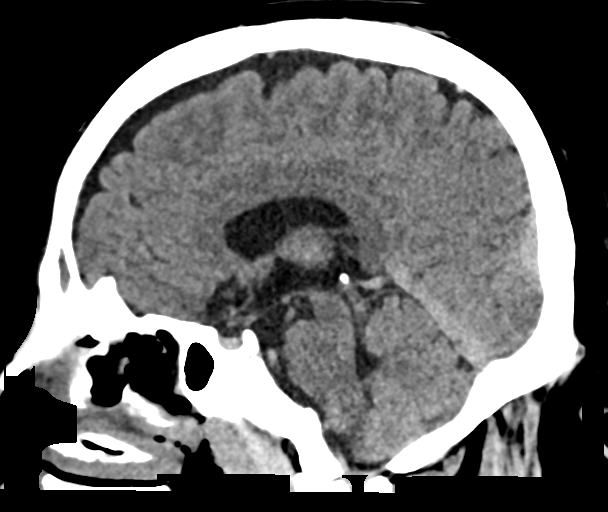
[im 41/62  brain]
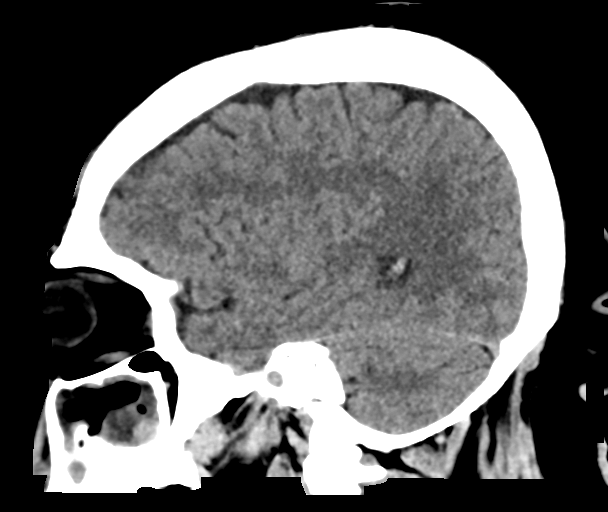

[16 of 47 positions shown; findings below may reference images not displayed]

FINDINGS: Brain: No evidence of acute infarction, hemorrhage, cerebral edema,
mass, mass effect, or midline shift. Ventricles and sulci are normal
for age. No extra-axial fluid collection.

Vascular: No hyperdense vessel or unexpected calcification.

Skull: Normal. Negative for fracture or focal lesion.

Sinuses/Orbits: No acute finding.

Other: The mastoid air cells are well aerated.

ASPECTS (Alberta Stroke Program Early CT Score)

- Ganglionic level infarction (caudate, lentiform nuclei, internal
capsule, insula, M1-M3 cortex): 7

- Supraganglionic infarction (M4-M6 cortex): 3

Total score (0-10 with 10 being normal): 10
IMPRESSION: 1. No acute intracranial process.
2. ASPECTS is 10

Code stroke imaging results were communicated on [DATE] at [DATE] to provider [facility] via secure text paging.

## 2021-05-22 IMAGING — MR MR MRA HEAD W/O CM
10 of 18 series · 16 of 48 positions shown · IV contrast (gadavist)
Comparison: Noncontrast head CT and CT angiogram head/neck
[DATE].

CLINICAL DATA: Stroke, follow-up. Additional history provided: Left
arm weakness and transient shaking, possible left facial droop.

EXAM:
MRI HEAD WITHOUT AND WITH CONTRAST
MRA HEAD WITHOUT CONTRAST
TECHNIQUE: Multiplanar, multi-echo pulse sequences of the brain and surrounding
structures were acquired without and with intravenous contrast.
Angiographic images of the Circle of Willis were acquired using MRA
technique without intravenous contrast.
CONTRAST:  10mL GADAVIST GADOBUTROL 1 MMOL/ML IV SOLN

[Series 2: DWI · axial · 3.0mm · 0.94mm/px · z∈[-42,+103]mm · 3 of 98 slices shown (1 of 2)]
[im 1/98]
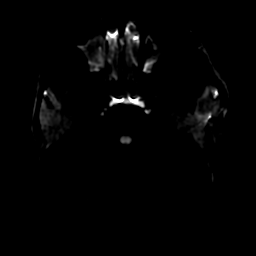
[im 49/98]
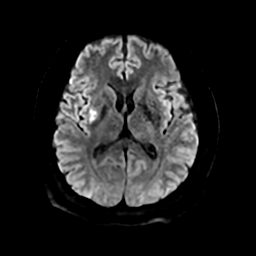
[im 98/98]
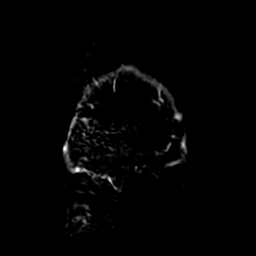

[Series 3: DWI · coronal · 4.0mm · 1.02mm/px · 3 of 74 slices shown (2 of 2)]
[im 1/74]
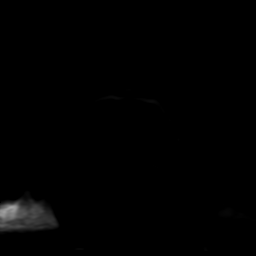
[im 37/74]
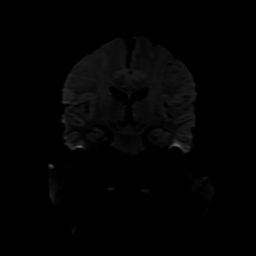
[im 74/74]
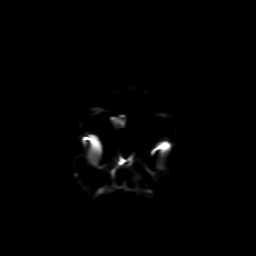

[Series 4: FLAIR · sagittal · 5.0mm · 0.23mm/px · 1 of 25 slices shown (1 of 3)]
[im 1/25]
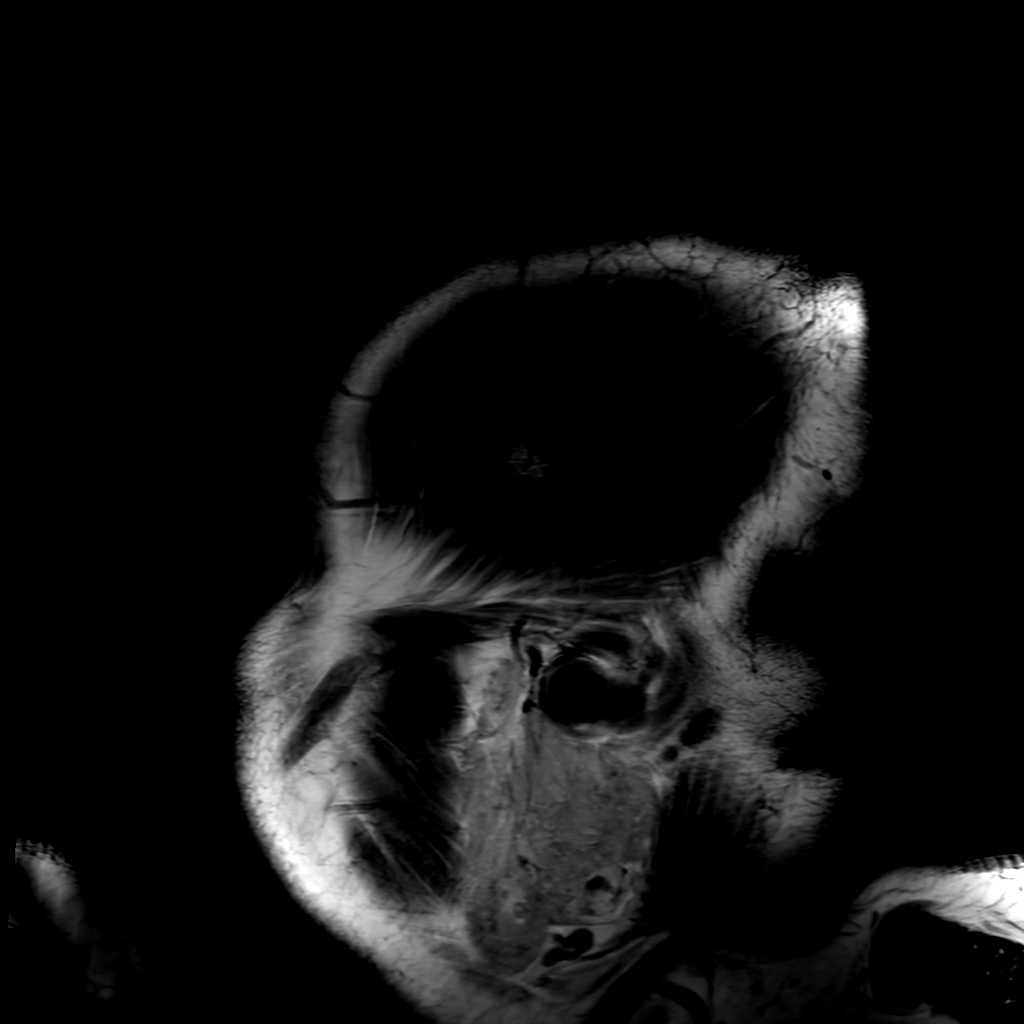

[Series 5: T2 · axial · 5.0mm · 0.23mm/px · 1 of 26 slices shown (1 of 2)]
[im 1/26]
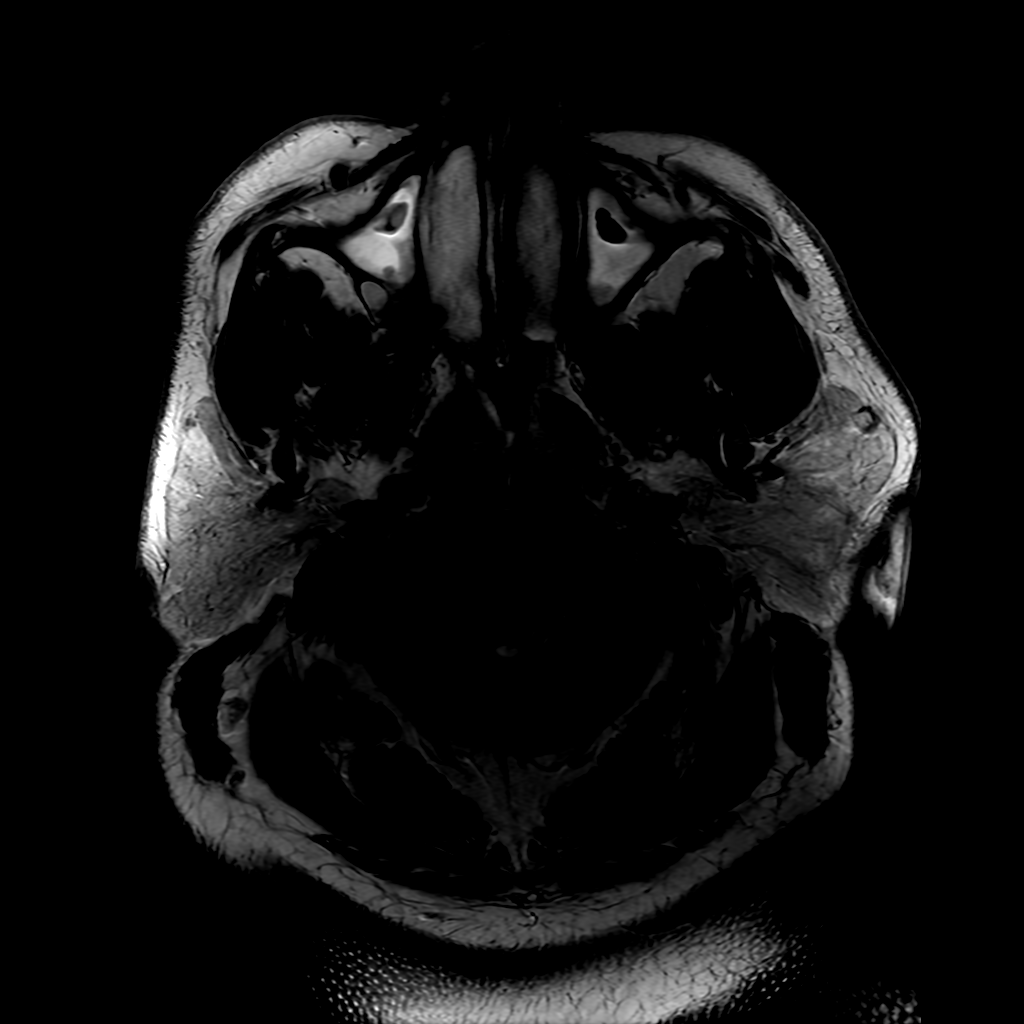

[Series 6: FLAIR · axial · 4.0mm · 0.49mm/px · 1 of 35 slices shown (2 of 3)]
[im 1/35]
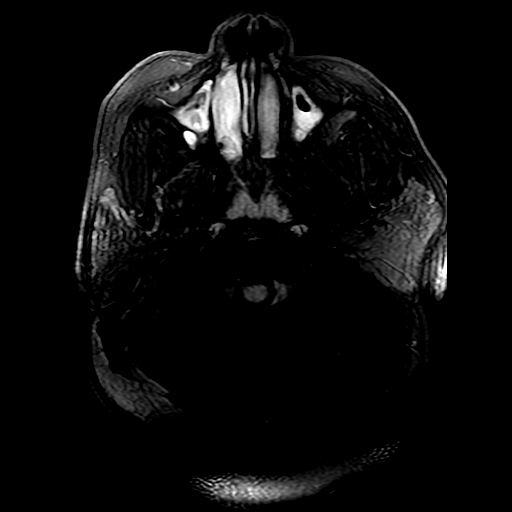

[Series 11: T2 · coronal · 3.0mm · 0.35mm/px · 1 of 30 slices shown (2 of 2)]
[im 1/30]
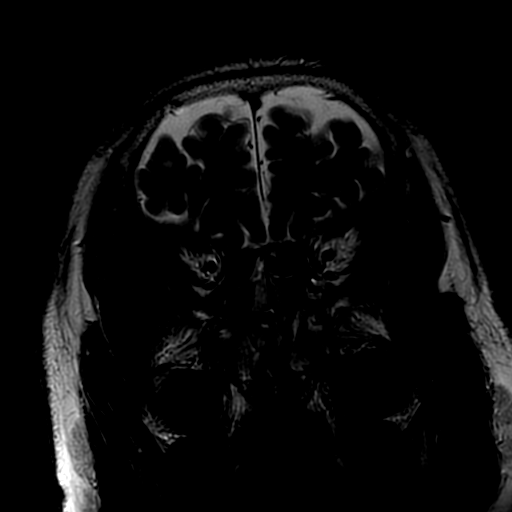

[Series 12: FLAIR · coronal · 3.0mm · 0.35mm/px · 1 of 30 slices shown (3 of 3)]
[im 1/30]
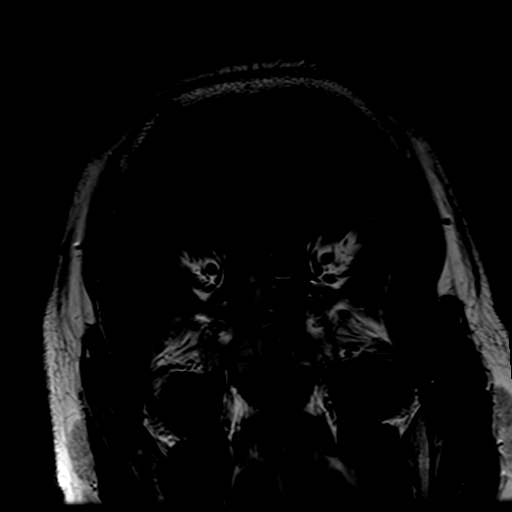

[Series 15: T1 post-contrast · coronal · 5.0mm · 0.43mm/px · 1 of 27 slices shown]
[im 1/27]
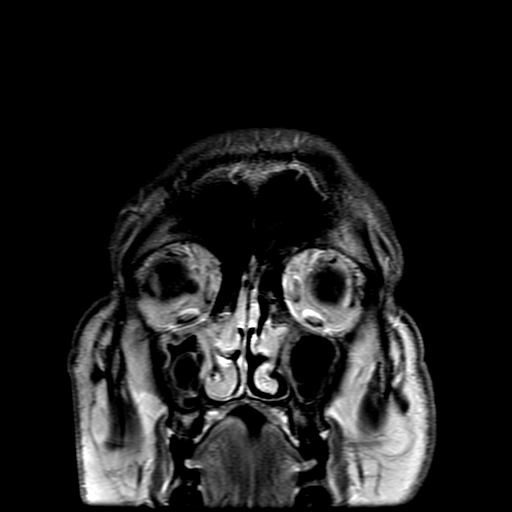

[Series 250: ADC · axial · 3.0mm · 0.94mm/px · z∈[-42,+103]mm · 2 of 49 slices shown (1 of 2)]
[im 1/49]
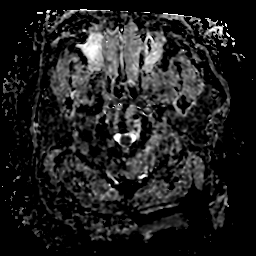
[im 49/49]
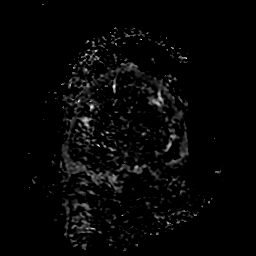

[Series 350: ADC · coronal · 4.0mm · 1.02mm/px · 2 of 37 slices shown (2 of 2)]
[im 1/37]
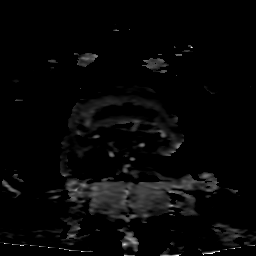
[im 37/37]
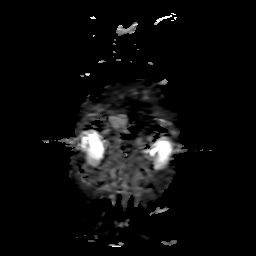

[16 of 48 positions shown; findings below may reference images not displayed]

FINDINGS: MRI HEAD FINDINGS

Brain:

Cerebral volume is normal.

Acute infarct measuring 3.4 cm in greatest dimension within the
right caudate and lentiform nuclei and right corona radiata.

Background multifocal T2 FLAIR hyperintense signal abnormality
within the bilateral cerebral white matter, overall mild but
significantly advanced for age.

The hippocampi are symmetric in size and signal.

No evidence of an intracranial mass.

No chronic intracranial blood products.

No extra-axial fluid collection.

No midline shift.

No pathologic intracranial enhancement identified.

Vascular: Reported below.

Skull and upper cervical spine: No focal suspicious marrow lesion.

Sinuses/Orbits: Rightward gaze. Complete T2 hyperintense
opacification of the right frontal sinus. Extensive T2 hyperintense
opacification of the left frontal sinus. Scattered mucosal
thickening and fluid within the left greater than right ethmoid air
cells. Frothy secretions and mild mucosal thickening within the
right sphenoid sinus. Mild mucosal thickening within the left
sphenoid sinus. Extensive partial opacification of the bilateral
maxillary sinuses secondary to the presence mucosal thickening and
large fluid levels.

Other: Fluid within the right middle ear/mastoid air cells. Trace
fluid also present within the left mastoid air cells.

MRA HEAD FINDINGS

Anterior circulation:

The intracranial internal carotid arteries are patent. The M1 middle
cerebral arteries are patent. Redemonstrated moderate stenosis
within the distal M1 right middle cerebral artery. No M2 proximal
branch occlusion or high-grade proximal stenosis is identified. The
anterior cerebral arteries are patent. No intracranial aneurysm is
identified.

Posterior circulation:

The intracranial vertebral arteries are patent. Dominant right
vertebral artery. The basilar artery is patent. The posterior
cerebral arteries are patent. A sizable left posterior communicating
artery is present. The right posterior communicating artery is
diminutive or absent.

Anatomic variants: As described.
IMPRESSION: MRI brain:

1. 3.4 cm acute infarct within the right basal ganglia and right
corona radiata.
2. Background multifocal T2 FLAIR hyperintense signal abnormality
within the bilateral cerebral white matter, overall mild but
significantly advanced for age. These signal changes are nonspecific
and differential considerations include age-advanced chronic small
vessel ischemic disease, sequela of chronic migraine headaches,
sequela of a prior infectious/inflammatory process or less likely
sequela of a demyelinating process (such as multiple sclerosis),
among others.
3. Extensive paranasal sinus disease, as described. Correlate for
acute sinusitis.
4. Right greater than left middle ear/mastoid effusions.

MRA head:

1. No intracranial large vessel occlusion is identified.
2. Redemonstrated moderate stenosis within the distal M1 segment of
the right middle cerebral artery.

## 2021-05-22 MED ORDER — IOHEXOL 350 MG/ML SOLN
50.0000 mL | Freq: Once | INTRAVENOUS | Status: AC | PRN
  Administered 2021-05-22: 50 mL via INTRAVENOUS

## 2021-05-22 MED ORDER — ACETAMINOPHEN 325 MG PO TABS: 650.0000 mg | ORAL_TABLET | ORAL | Status: DC | PRN

## 2021-05-22 MED ORDER — ACETAMINOPHEN 160 MG/5ML PO SOLN: 650.0000 mg | ORAL | Status: DC | PRN

## 2021-05-22 MED ORDER — CLOPIDOGREL BISULFATE 75 MG PO TABS: 75.0000 mg | ORAL_TABLET | Freq: Every day | ORAL | Status: DC

## 2021-05-22 MED ORDER — SODIUM CHLORIDE 0.9 % IV SOLN: Freq: Once | INTRAVENOUS | Status: AC

## 2021-05-22 MED ORDER — CLOPIDOGREL BISULFATE 300 MG PO TABS
300.0000 mg | ORAL_TABLET | Freq: Once | ORAL | Status: AC
  Administered 2021-05-22: 300 mg via ORAL
  Filled 2021-05-22: qty 1

## 2021-05-22 MED ORDER — ATORVASTATIN CALCIUM 40 MG PO TABS
40.0000 mg | ORAL_TABLET | Freq: Every day | ORAL | Status: DC
  Administered 2021-05-22: 40 mg via ORAL
  Filled 2021-05-22: qty 1

## 2021-05-22 MED ORDER — GADOBUTROL 1 MMOL/ML IV SOLN
10.0000 mL | Freq: Once | INTRAVENOUS | Status: AC | PRN
  Administered 2021-05-22: 10 mL via INTRAVENOUS

## 2021-05-22 MED ORDER — STROKE: EARLY STAGES OF RECOVERY BOOK
Freq: Once | Status: DC
  Filled 2021-05-22: qty 1

## 2021-05-22 MED ORDER — ASPIRIN EC 325 MG PO TBEC
325.0000 mg | DELAYED_RELEASE_TABLET | Freq: Every day | ORAL | Status: DC
  Administered 2021-05-23: 325 mg via ORAL
  Filled 2021-05-22 (×3): qty 1

## 2021-05-22 MED ORDER — ASPIRIN 325 MG PO TABS
ORAL_TABLET | ORAL | Status: AC
  Administered 2021-05-22: 325 mg via ORAL
  Filled 2021-05-22: qty 1

## 2021-05-22 MED ORDER — ASPIRIN 325 MG PO TABS: 325.0000 mg | ORAL_TABLET | Freq: Every day | ORAL | Status: DC

## 2021-05-22 MED ORDER — ATORVASTATIN CALCIUM 80 MG PO TABS
80.0000 mg | ORAL_TABLET | Freq: Every day | ORAL | Status: DC
  Administered 2021-05-23: 80 mg via ORAL
  Filled 2021-05-22: qty 1

## 2021-05-22 MED ORDER — ENOXAPARIN SODIUM 40 MG/0.4ML IJ SOSY
40.0000 mg | PREFILLED_SYRINGE | INTRAMUSCULAR | Status: DC
  Administered 2021-05-22 – 2021-05-23 (×2): 40 mg via SUBCUTANEOUS
  Filled 2021-05-22 (×2): qty 0.4

## 2021-05-22 MED ORDER — CALCIUM GLUCONATE-NACL 1-0.675 GM/50ML-% IV SOLN
1.0000 g | Freq: Once | INTRAVENOUS | Status: AC
  Administered 2021-05-22: 1000 mg via INTRAVENOUS
  Filled 2021-05-22: qty 50

## 2021-05-22 MED ORDER — NICOTINE 7 MG/24HR TD PT24
7.0000 mg | MEDICATED_PATCH | Freq: Every day | TRANSDERMAL | Status: DC
  Administered 2021-05-22 – 2021-05-23 (×2): 7 mg via TRANSDERMAL
  Filled 2021-05-22 (×3): qty 1

## 2021-05-22 MED ORDER — ACETAMINOPHEN 650 MG RE SUPP: 650.0000 mg | RECTAL | Status: DC | PRN

## 2021-05-22 MED ORDER — ASPIRIN 325 MG PO TABS: 325.0000 mg | ORAL_TABLET | Freq: Once | ORAL | Status: AC

## 2021-05-22 MED ORDER — CLOPIDOGREL BISULFATE 75 MG PO TABS
75.0000 mg | ORAL_TABLET | Freq: Every day | ORAL | Status: DC
  Administered 2021-05-23: 75 mg via ORAL
  Filled 2021-05-22: qty 1

## 2021-05-22 MED ORDER — ASPIRIN EC 81 MG PO TBEC: 81.0000 mg | DELAYED_RELEASE_TABLET | Freq: Every day | ORAL | Status: DC

## 2021-05-22 NOTE — ED Notes (Signed)
Paged code stroke on patient

## 2021-05-22 NOTE — Progress Notes (Signed)
EEG complete - results pending 

## 2021-05-22 NOTE — Code Documentation (Signed)
Responded to Code Stroke called at 0242 on pt that arrived in the ED at 0210. Pt came to ED d/t aphasia and uncontrollable movements of L arm, [id number]. CBG-108, NIH-0, CTH-negative for acute changes, CTA-no LVO. Plan ASA, MRI, and EEG.

## 2021-05-22 NOTE — ED Notes (Signed)
Pt speaking with PA, states symptoms started one hour ago, code stroke activated.

## 2021-05-22 NOTE — ED Notes (Signed)
Provider at bedside

## 2021-05-22 NOTE — Procedures (Signed)
Patient Name: Ernest [name]  MRN: [id number]  Epilepsy Attending: Charlsie Quest  Referring Physician/Provider: Dr Marisue Humble Date: 05/22/2021 Duration: 23.26 mins  Patient history:  35 y.o. male with transient left weakness and slurred speech. EEG to evaluate for seizure.  Level of alertness: Awake, asleep  AEDs during EEG study: None  Technical aspects: This EEG study was done with scalp electrodes positioned according to the 10-20 International system of electrode placement. Electrical activity was acquired at a sampling rate of 500Hz  and reviewed with a high frequency filter of 70Hz  and a low frequency filter of 1Hz . EEG data were recorded continuously and digitally stored.   Description: The posterior dominant rhythm consists of 10 Hz activity of moderate voltage (25-35 uV) seen predominantly in posterior head regions, symmetric and reactive to eye opening and eye closing. Sleep was characterized by vertex waves,  maximal frontocentral region. Hyperventilation and photic stimulation were not performed.     IMPRESSION: This study is within normal limits. No seizures or epileptiform discharges were seen throughout the recording.  Turki Tapanes 

## 2021-05-22 NOTE — H&P (Signed)
History and Physical    Ernest [name] QMG:[id number] DOB: 10/26/1985 DOA: 05/22/2021  Referring MD/NP/PA: Inda Merlin, MD PCP: Patient, No Pcp Per (Inactive)  Patient coming from: Home  Chief Complaint: Left-sided weak  I have personally briefly reviewed patient's old medical records in [place]   HPI: Ernest [name] is a 35 y.o. mainly [place] speaking male with medical history significant of compartment syndrome of the right arm and obesity who presented with left-sided weakness.  History is obtained with use of interpreter services. Last known normal thought to be around 9:30 PM last night. He notes that when he woke up this morning he was unable to use his left side in his mouth was twisted.  It was difficult for his wife to understand him and difficult to walk.  Symptoms seem to wax and wane in intensity.   Reports that he may have had a headache yesterday, and normally takes Tylenol once a day for pain in his right upper extremity.  Patient admits to smoking cigarettes, but reports that a pack last him a week.  Denies any drug use.  He has no primary care provider and usually when he eats something he will go to urgent care.  ED Course: Upon admission into the emergency department patient was seen code stroke.  Noted to be afebrile, respirations 18-23, and all other vital signs maintained.  CT of the head showed no acute abnormality.  Subsequent CTA of the head and neck did not note any large vessel occlusion.  Neurology had evaluated the patient who was not a tPA candidate given unclear time of onset and improving symptoms.  Labs significant for WBC 11.7, calcium 8.7, ionized calcium 1.08, AST 53, ALT 73, and INR 1.  Influenza and COVID-19 screening were negative.  Alcohol level was undetectable.  Patient was given 325 mg of aspirin.  MRI and EEG were ordered.  Neurology recommended admission for completion of stroke work-up.  Review of Systems  Constitutional:  Negative for  fever.  HENT:  Negative for ear discharge and nosebleeds.   Eyes:  Negative for photophobia and pain.  Respiratory:  Negative for cough and shortness of breath.   Cardiovascular:  Negative for chest pain and leg swelling.  Musculoskeletal:  Positive for myalgias.  Neurological:  Positive for speech change and focal weakness.  Psychiatric/Behavioral:  Positive for substance abuse. The patient does not have insomnia.    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[place])     Past Surgical History:  Procedure Laterality Date   DECOMPRESSION FASCIOTOMY FOREARM Right      reports that he has been smoking. He has been smoking an average of .01 packs per day. He has never used smokeless tobacco. He reports that he does not drink alcohol and does not use drugs.  No Known Allergies  Family History  Problem Relation Age of Onset   Diabetes Neg Hx    Cancer Neg Hx    Heart failure Neg Hx    Hyperlipidemia Neg Hx    Hypertension Neg Hx     Prior to Admission medications   Medication Sig Start Date End Date Taking? Authorizing Provider  hydrocortisone cream 1 % Apply to affected area 2 times daily 03/24/19   Robinson, [place] N, PA-C  ibuprofen (ADVIL,MOTRIN) 800 MG tablet Take 1 tablet (800 mg total) by mouth every 8 (eight) hours as needed. 08/07/17   Petrucelli, Samantha R, PA-C  tamsulosin (FLOMAX) 0.4 MG  CAPS capsule Take 1 capsule (0.4 mg total) by mouth daily after supper. 08/07/17   Petrucelli, Glynda Jaeger, PA-C    Physical Exam:  Constitutional: Morbidly obese male currently in no acute distress Vitals:   05/22/21 0500 05/22/21 0530 05/22/21 0605 05/22/21 0730  BP: 115/83 114/63 112/71 113/77  Pulse: 81 81 77 81  Resp: (!) 23 (!) _0 Temp:    98 F (36.7 C)  TempSrc:    Oral  SpO2: 97% 96% 96% 97%   Eyes: PERRL, lids and conjunctivae normal ENMT: Mucous membranes are moist. Posterior pharynx clear of any exudate or lesions.Normal dentition.   Neck: Increased circumference of neck  Respiratory: clear to auscultation bilaterally, no wheezing, no crackles. Normal respiratory effort. No accessory muscle use.  Cardiovascular: Regular rate and rhythm, no murmurs / rubs / gallops. No extremity edema. 2+ pedal pulses. No carotid bruits.  Abdomen: Protuberant abdomen.  No tenderness, no masses palpated. No hepatosplenomegaly. Bowel sounds positive.  Musculoskeletal: no clubbing / cyanosis. No joint deformity upper and lower extremities. Good ROM, no contractures. Normal muscle tone.  Skin: acanthosis nigricans present on the neck.  Skin graft present over the right forearm. Neurologic: CN 2-12 grossly intact.  Strength 4/5 in the left upper and lower extremity and 5/5 on the right side. Psychiatric: Normal judgment and insight. Alert and oriented x 3. Normal mood.     Labs on Admission: I have personally reviewed following labs and imaging studies  CBC: Recent Labs  Lab 05/22/21 0240 05/22/21 0246  WBC 11.7*  --   NEUTROABS 5.7  --   HGB 14.7 15.3  HCT 43.8 45.0  MCV 85.5  --   PLT 289  --    Basic Metabolic Panel: Recent Labs  Lab 05/22/21 0240 05/22/21 0246  NA 135 139  K 3.8 3.7  CL 104 104  CO2 23  --   GLUCOSE 109* 107*  BUN 8 7  CREATININE 0.98 0.90  CALCIUM 8.7*  --    GFR: CrCl cannot be calculated (Unknown ideal weight.). Liver Function Tests: Recent Labs  Lab 05/22/21 0240  AST 53*  ALT 73*  ALKPHOS 92  BILITOT 0.7  PROT 7.3  ALBUMIN 3.4*   No results for input(s): LIPASE, AMYLASE in the last 168 hours. No results for input(s): AMMONIA in the last 168 hours. Coagulation Profile: Recent Labs  Lab 05/22/21 0240  INR 1.0   Cardiac Enzymes: No results for input(s): CKTOTAL, CKMB, CKMBINDEX, TROPONINI in the last 168 hours. BNP (last 3 results) No results for input(s): PROBNP in the last 8760 hours. HbA1C: No results for input(s): HGBA1C in the last 72 hours. CBG: Recent Labs  Lab  05/22/21 0305  GLUCAP 108*   Lipid Profile: No results for input(s): CHOL, HDL, LDLCALC, TRIG, CHOLHDL, LDLDIRECT in the last 72 hours. Thyroid Function Tests: No results for input(s): TSH, T4TOTAL, FREET4, T3FREE, THYROIDAB in the last 72 hours. Anemia Panel: No results for input(s): VITAMINB12, FOLATE, FERRITIN, TIBC, IRON, RETICCTPCT in the last 72 hours. Urine analysis:    Component Value Date/Time   COLORURINE YELLOW 08/07/2017 1150   APPEARANCEUR CLEAR 08/07/2017 1150   LABSPEC 1.025 08/07/2017 1150   PHURINE 6.0 08/07/2017 1150   GLUCOSEU NEGATIVE 08/07/2017 1150   HGBUR MODERATE (A) 08/07/2017 1150   BILIRUBINUR NEGATIVE 08/07/2017 [place] 08/07/2017 1150   PROTEINUR 100 (A) 08/07/2017 1150   NITRITE NEGATIVE 08/07/2017 [place] 08/07/2017 1150  Sepsis Labs: Recent Results (from the past 240 hour(s))  Resp Panel by RT-PCR (Flu A&B, Covid) Nasopharyngeal Swab     Status: None   Collection Time: 05/22/21  2:38 AM   Specimen: Nasopharyngeal Swab; Nasopharyngeal(NP) swabs in vial transport medium  Result Value Ref Range Status   SARS Coronavirus 2 by RT PCR NEGATIVE NEGATIVE Final    Comment: (NOTE) SARS-CoV-2 target nucleic acids are NOT DETECTED.  The SARS-CoV-2 RNA is generally detectable in upper respiratory specimens during the acute phase of infection. The lowest concentration of SARS-CoV-2 viral copies this assay can detect is 138 copies/mL. A negative result does not preclude SARS-Cov-2 infection and should not be used as the sole basis for treatment or other patient management decisions. A negative result may occur with  improper specimen collection/handling, submission of specimen other than nasopharyngeal swab, presence of viral mutation(s) within the areas targeted by this assay, and inadequate number of viral copies(<138 copies/mL). A negative result must be combined with clinical observations, patient history, and  epidemiological information. The expected result is Negative.  Fact Sheet for Patients:  EntrepreneurPulse.com.au  Fact Sheet for Healthcare Providers:  IncredibleEmployment.be  This test is no t yet approved or cleared by the [place] FDA and  has been authorized for detection and/or diagnosis of SARS-CoV-2 by FDA under an Emergency Use Authorization (EUA). This EUA will remain  in effect (meaning this test can be used) for the duration of the COVID-19 declaration under Section 564(b)(1) of the Act, 21 U.S.C.section 360bbb-3(b)(1), unless the authorization is terminated  or revoked sooner.       Influenza A by PCR NEGATIVE NEGATIVE Final   Influenza B by PCR NEGATIVE NEGATIVE Final    Comment: (NOTE) The Xpert Xpress SARS-CoV-2/FLU/RSV plus assay is intended as an aid in the diagnosis of influenza from Nasopharyngeal swab specimens and should not be used as a sole basis for treatment. Nasal washings and aspirates are unacceptable for Xpert Xpress SARS-CoV-2/FLU/RSV testing.  Fact Sheet for Patients: EntrepreneurPulse.com.au  Fact Sheet for Healthcare Providers: IncredibleEmployment.be  This test is not yet approved or cleared by the [place] FDA and has been authorized for detection and/or diagnosis of SARS-CoV-2 by FDA under an Emergency Use Authorization (EUA). This EUA will remain in effect (meaning this test can be used) for the duration of the COVID-19 declaration under Section 564(b)(1) of the Act, 21 U.S.C. section 360bbb-3(b)(1), unless the authorization is terminated or revoked.  Performed at [facility], [place] [address], [place], [place] [address]      Radiological Exams on Admission: CT HEAD CODE STROKE WO CONTRAST  Result Date: 05/22/2021 CLINICAL DATA:  Code stroke.  Left-sided weakness EXAM: CT HEAD WITHOUT CONTRAST TECHNIQUE: Contiguous axial images were obtained  from the base of the skull through the vertex without intravenous contrast. COMPARISON:  None. FINDINGS: Brain: No evidence of acute infarction, hemorrhage, cerebral edema, mass, mass effect, or midline shift. Ventricles and sulci are normal for age. No extra-axial fluid collection. Vascular: No hyperdense vessel or unexpected calcification. Skull: Normal. Negative for fracture or focal lesion. Sinuses/Orbits: No acute finding. Other: The mastoid air cells are well aerated. ASPECTS [facility] Stroke Program Early CT Score) - Ganglionic level infarction (caudate, lentiform nuclei, internal capsule, insula, M1-M3 cortex): 7 - Supraganglionic infarction (M4-M6 cortex): 3 Total score (0-10 with 10 being normal): 10 IMPRESSION: 1. No acute intracranial process. 2. ASPECTS is 10 Code stroke imaging results were communicated on 05/22/2021 at 2:50 am to provider [facility]  via secure text paging. Electronically Signed   By: Merilyn Baba M.D.   On: 05/22/2021 02:51   CT ANGIO HEAD CODE STROKE  Result Date: 05/22/2021 CLINICAL DATA:  Left arm weakness and transient shaking, possible left facial droop EXAM: CT ANGIOGRAPHY HEAD AND NECK TECHNIQUE: Multidetector CT imaging of the head and neck was performed using the standard protocol during bolus administration of intravenous contrast. Multiplanar CT image reconstructions and MIPs were obtained to evaluate the vascular anatomy. Carotid stenosis measurements (when applicable) are obtained utilizing NASCET criteria, using the distal internal carotid diameter as the denominator. CONTRAST:  49m OMNIPAQUE IOHEXOL 350 MG/ML SOLN COMPARISON:  None. FINDINGS: CT HEAD FINDINGS Please see same-day CT stroke code CTA NECK FINDINGS Aortic arch: 4 vessel arch, with the origin of the left vertebral artery originating from the arch imaged portion shows no evidence of aneurysm or dissection. No significant stenosis of the major arch vessel origins. Right carotid system: No evidence of  dissection, stenosis (50% or greater) or occlusion. Left carotid system: No evidence of dissection, stenosis (50% or greater) or occlusion. Retropharyngeal course of the left CCA and ICA. Vertebral arteries: No evidence of dissection, stenosis (50% or greater) or occlusion. Skeleton: No acute osseous abnormality. Other neck: Negative Upper chest: Negative Review of the MIP images confirms the above findings CTA HEAD FINDINGS Anterior circulation: Both internal carotid arteries are patent to the termini, without stenosis or other abnormality. A1 segments patent, with possible stenosis at the origin of the left A1. Normal anterior communicating artery. Anterior cerebral arteries are patent to their distal aspects. No M1 stenosis or occlusion. Normal MCA bifurcations. Distal MCA branches perfused and symmetric. Posterior circulation: Vertebral arteries widely patent to the vertebrobasilar junction without stenosis, with the majority of the left vertebral artery supplying the left PICA. Posterior inferior cerebral arteries patent bilaterally. Basilar patent to its distal aspect. Superior cerebral arteries patent bilaterally. Fetal origin left PCA. PCAs well perfused to their distal aspects without stenosis. The right posterior communicating artery is not definitively visualized. Venous sinuses: As permitted by contrast timing, patent. Anatomic variants: None significant Review of the MIP images confirms the above findings IMPRESSION: 1. No hemodynamically significant stenosis in the neck. 2. No intracranial large vessel occlusion. Electronically Signed   By: AMerilyn BabaM.D.   On: 05/22/2021 03:16   CT ANGIO NECK CODE STROKE  Result Date: 05/22/2021 CLINICAL DATA:  Left arm weakness and transient shaking, possible left facial droop EXAM: CT ANGIOGRAPHY HEAD AND NECK TECHNIQUE: Multidetector CT imaging of the head and neck was performed using the standard protocol during bolus administration of intravenous  contrast. Multiplanar CT image reconstructions and MIPs were obtained to evaluate the vascular anatomy. Carotid stenosis measurements (when applicable) are obtained utilizing NASCET criteria, using the distal internal carotid diameter as the denominator. CONTRAST:  57mOMNIPAQUE IOHEXOL 350 MG/ML SOLN COMPARISON:  None. FINDINGS: CT HEAD FINDINGS Please see same-day CT stroke code CTA NECK FINDINGS Aortic arch: 4 vessel arch, with the origin of the left vertebral artery originating from the arch imaged portion shows no evidence of aneurysm or dissection. No significant stenosis of the major arch vessel origins. Right carotid system: No evidence of dissection, stenosis (50% or greater) or occlusion. Left carotid system: No evidence of dissection, stenosis (50% or greater) or occlusion. Retropharyngeal course of the left CCA and ICA. Vertebral arteries: No evidence of dissection, stenosis (50% or greater) or occlusion. Skeleton: No acute osseous abnormality. Other neck: Negative Upper chest: Negative Review  of the MIP images confirms the above findings CTA HEAD FINDINGS Anterior circulation: Both internal carotid arteries are patent to the termini, without stenosis or other abnormality. A1 segments patent, with possible stenosis at the origin of the left A1. Normal anterior communicating artery. Anterior cerebral arteries are patent to their distal aspects. No M1 stenosis or occlusion. Normal MCA bifurcations. Distal MCA branches perfused and symmetric. Posterior circulation: Vertebral arteries widely patent to the vertebrobasilar junction without stenosis, with the majority of the left vertebral artery supplying the left PICA. Posterior inferior cerebral arteries patent bilaterally. Basilar patent to its distal aspect. Superior cerebral arteries patent bilaterally. Fetal origin left PCA. PCAs well perfused to their distal aspects without stenosis. The right posterior communicating artery is not definitively  visualized. Venous sinuses: As permitted by contrast timing, patent. Anatomic variants: None significant Review of the MIP images confirms the above findings IMPRESSION: 1. No hemodynamically significant stenosis in the neck. 2. No intracranial large vessel occlusion. Electronically Signed   By: Merilyn Baba M.D.   On: 05/22/2021 03:16    EKG: Independently reviewed.  Sinus rhythm at 90 bpm  Assessment/Plan CVA: Patient presented with complaints of left-sided weakness of the upper and lower extremities with difficulty speaking.  CT scan of the brain and CTA of the head and neck negative for any acute signs of stroke.  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  EEG showed no acute abnormalities.  Risk factors include morbid obesity, hyperlipidemia,-Admit to telemetry bed -Stroke order set initiated -Neuro checks -Follow-up UDS -Check Hemoglobin A1c, lipid panel, and TSH (0.687) -Check echocardiogram -PT/OT/speech to evaluate and treat -ASA and Plavix for 3 weeks then aspirin alone -Follow-up telemetry overnight -Appreciate neurology consultative services, will follow-up   Leukocytosis: Acute.  WBC elevated at 11.7.  Chest x-ray showed no acute abnormalities.  Patient does not report any other infectious symptoms.  Could be reactive secondary to acute stroke. -Check ESR (24), CRP (0.8), RPR, HIV (negative)   Hypocalcemia: Acute.  Calcium was low at 8.7 with ionized calcium noted to be 1.08. -Give 1 g of calcium gluconate IV -Continue to monitor and replace as needed  Transaminitis: Acute.  On admission AST 53 and ALT 73.  Patient does not report drinking alcohol.  Unclear cause of symptoms at this time. -Check hepatitis panel (negative) and acetaminophen level (undetectable) -Warrant further work-up  Hyperlipidemia: Total cholesterol elevated at 120 -Started on atorvastatin 80 mg daily  History of compartment syndrome: Status post  decompression with fasciotomy requiring 4 surgeries.  Morbid obesity: BMI 55.45 kg/m.  Patient has acanthosis nigricans and protuberant abdomen with increased abdominal circumference which would be concerning for metabolic syndrome. -Will need continued education on need of weight loss with diet and lifestyle modification -Neurology to set up with outpatient sleep study  DVT prophylaxis: Lovenox Code Status: Full Disposition Plan: Likely discharge home once medically stable Consults called: Neurology Admission status: Observation  Norval Morton MD [facility]   If 7PM-7AM, please contact night-coverage   05/22/2021, 8:09 AM

## 2021-05-22 NOTE — ED Notes (Signed)
Patient being assessed with assistance of interperter

## 2021-05-22 NOTE — ED Provider Notes (Signed)
[facility] EMERGENCY DEPARTMENT Provider Note   CSN: [id number] Arrival date & time: 05/22/21  0210     History Chief Complaint  Patient presents with   Weakness    Ernest [name] is a 35 y.o. male.  The history is provided by the patient.  Weakness Severity:  Severe Onset quality:  Sudden Duration:  1 hour Timing:  Constant Progression:  Unchanged Chronicity:  New Relieved by:  Nothing Worsened by:  Nothing Ineffective treatments:  None tried Associated symptoms: sensory-motor deficit   Associated symptoms: no drooling, no fever and no vomiting   Associated symptoms comment:  Dysarthria and facial droop and LUE shaking  Risk factors: no neurologic disease and no new medications       History reviewed. No pertinent past medical history.  There are no problems to display for this patient.   History reviewed. No pertinent surgical history.     Family History  Problem Relation Age of Onset   Diabetes Neg Hx    Cancer Neg Hx    Heart failure Neg Hx    Hyperlipidemia Neg Hx    Hypertension Neg Hx     Social History   Tobacco Use   Smoking status: Every Day    Packs/day: 0.01    Types: Cigarettes   Smokeless tobacco: Never  Substance Use Topics   Alcohol use: Never   Drug use: Never    Home Medications Prior to Admission medications   Medication Sig Start Date End Date Taking? Authorizing Provider  hydrocortisone cream 1 % Apply to affected area 2 times daily 03/24/19   Robinson, [place] N, PA-C  ibuprofen (ADVIL,MOTRIN) 800 MG tablet Take 1 tablet (800 mg total) by mouth every 8 (eight) hours as needed. 08/07/17   Petrucelli, Samantha R, PA-C  tamsulosin (FLOMAX) 0.4 MG CAPS capsule Take 1 capsule (0.4 mg total) by mouth daily after supper. 08/07/17   Petrucelli, Pleas Koch, PA-C    Allergies    Patient has no known allergies.  Review of Systems   Review of Systems  Constitutional:  Negative for diaphoresis and fever.  HENT:   Negative for drooling.   Eyes:  Negative for redness.  Respiratory:  Negative for wheezing and stridor.   Cardiovascular:  Negative for leg swelling.  Gastrointestinal:  Negative for vomiting.  Musculoskeletal:  Negative for neck stiffness.  Skin:  Negative for rash.  Neurological:  Positive for tremors, facial asymmetry, speech difficulty and weakness.  Psychiatric/Behavioral:  Negative for agitation.   All other systems reviewed and are negative.  Physical Exam Updated Vital Signs BP 140/78 (BP Location: Right Arm)   Pulse 99   Temp 98 F (36.7 C) (Oral)   Resp 20   SpO2 100%   Physical Exam Vitals and nursing note reviewed.  Constitutional:      Appearance: Normal appearance. He is not diaphoretic.  HENT:     Head: Normocephalic and atraumatic.     Nose: Nose normal.  Eyes:     Conjunctiva/sclera: Conjunctivae normal.     Pupils: Pupils are equal, round, and reactive to light.  Cardiovascular:     Rate and Rhythm: Normal rate and regular rhythm.     Pulses: Normal pulses.     Heart sounds: Normal heart sounds.  Pulmonary:     Effort: Pulmonary effort is normal.     Breath sounds: Normal breath sounds.  Abdominal:     General: Abdomen is flat. Bowel sounds are normal.  Palpations: Abdomen is soft.     Tenderness: There is no abdominal tenderness. There is no guarding.  Musculoskeletal:        General: Normal range of motion.     Cervical back: Normal range of motion and neck supple.  Skin:    General: Skin is warm and dry.     Capillary Refill: Capillary refill takes less than 2 seconds.  Neurological:     Mental Status: He is alert.     Cranial Nerves: Cranial nerve deficit present.     Motor: Weakness present.     Deep Tendon Reflexes: Reflexes normal.  Psychiatric:        Mood and Affect: Mood normal.        Behavior: Behavior normal.    ED Results / Procedures / Treatments   Labs (all labs ordered are listed, but only abnormal results are  displayed) Results for orders placed or performed during the hospital encounter of 05/22/21  Protime-INR  Result Value Ref Range   Prothrombin Time 12.9 11.4 - 15.2 seconds   INR 1.0 0.8 - 1.2  APTT  Result Value Ref Range   aPTT 31 24 - 36 seconds  CBC  Result Value Ref Range   WBC 11.7 (H) 4.0 - 10.5 K/uL   RBC 5.12 4.22 - 5.81 MIL/uL   Hemoglobin 14.7 13.0 - 17.0 g/dL   HCT 61.6 07.3 - 71.0 %   MCV 85.5 80.0 - 100.0 fL   MCH 28.7 26.0 - 34.0 pg   MCHC 33.6 30.0 - 36.0 g/dL   RDW 62.6 94.8 - 54.6 %   Platelets 289 150 - 400 K/uL   nRBC 0.0 0.0 - 0.2 %  Differential  Result Value Ref Range   Neutrophils Relative % 47 %   Neutro Abs 5.7 1.7 - 7.7 K/uL   Lymphocytes Relative 37 %   Lymphs Abs 4.3 (H) 0.7 - 4.0 K/uL   Monocytes Relative 9 %   Monocytes Absolute 1.0 0.1 - 1.0 K/uL   Eosinophils Relative 5 %   Eosinophils Absolute 0.6 (H) 0.0 - 0.5 K/uL   Basophils Relative 1 %   Basophils Absolute 0.1 0.0 - 0.1 K/uL   Immature Granulocytes 1 %   Abs Immature Granulocytes 0.08 (H) 0.00 - 0.07 K/uL  I-stat chem 8, ED  Result Value Ref Range   Sodium 139 135 - 145 mmol/L   Potassium 3.7 3.5 - 5.1 mmol/L   Chloride 104 98 - 111 mmol/L   BUN 7 6 - 20 mg/dL   Creatinine, Ser 2.70 0.61 - 1.24 mg/dL   Glucose, Bld 350 (H) 70 - 99 mg/dL   Calcium, Ion 0.93 (L) 1.15 - 1.40 mmol/L   TCO2 24 22 - 32 mmol/L   Hemoglobin 15.3 13.0 - 17.0 g/dL   HCT 81.8 29.9 - 37.1 %  CBG monitoring, ED  Result Value Ref Range   Glucose-Capillary 108 (H) 70 - 99 mg/dL   CT HEAD CODE STROKE WO CONTRAST  Result Date: 05/22/2021 CLINICAL DATA:  Code stroke.  Left-sided weakness EXAM: CT HEAD WITHOUT CONTRAST TECHNIQUE: Contiguous axial images were obtained from the base of the skull through the vertex without intravenous contrast. COMPARISON:  None. FINDINGS: Brain: No evidence of acute infarction, hemorrhage, cerebral edema, mass, mass effect, or midline shift. Ventricles and sulci are normal for  age. No extra-axial fluid collection. Vascular: No hyperdense vessel or unexpected calcification. Skull: Normal. Negative for fracture or focal lesion. Sinuses/Orbits: No acute finding.  Other: The mastoid air cells are well aerated. ASPECTS [facility] Stroke Program Early CT Score) - Ganglionic level infarction (caudate, lentiform nuclei, internal capsule, insula, M1-M3 cortex): 7 - Supraganglionic infarction (M4-M6 cortex): 3 Total score (0-10 with 10 being normal): 10 IMPRESSION: 1. No acute intracranial process. 2. ASPECTS is 10 Code stroke imaging results were communicated on 05/22/2021 at 2:50 am to provider [employer] via secure text paging. Electronically Signed   By: Wiliam Ke M.D.   On: 05/22/2021 02:51    EKG  EKG Interpretation  Date/Time:  Thursday May 22 2021 02:19:05 EDT Ventricular Rate:  86 PR Interval:  132 QRS Duration: 82 QT Interval:  330 QTC Calculation: 394 R Axis:   37 Text Interpretation: Normal sinus rhythm Confirmed by Nicanor Alcon, Rosemary Pentecost ([id number]) on 05/22/2021 3:12:13 AM         Radiology CT HEAD CODE STROKE WO CONTRAST  Result Date: 05/22/2021 CLINICAL DATA:  Code stroke.  Left-sided weakness EXAM: CT HEAD WITHOUT CONTRAST TECHNIQUE: Contiguous axial images were obtained from the base of the skull through the vertex without intravenous contrast. COMPARISON:  None. FINDINGS: Brain: No evidence of acute infarction, hemorrhage, cerebral edema, mass, mass effect, or midline shift. Ventricles and sulci are normal for age. No extra-axial fluid collection. Vascular: No hyperdense vessel or unexpected calcification. Skull: Normal. Negative for fracture or focal lesion. Sinuses/Orbits: No acute finding. Other: The mastoid air cells are well aerated. ASPECTS [facility] Stroke Program Early CT Score) - Ganglionic level infarction (caudate, lentiform nuclei, internal capsule, insula, M1-M3 cortex): 7 - Supraganglionic infarction (M4-M6 cortex): 3 Total score (0-10 with 10 being  normal): 10 IMPRESSION: 1. No acute intracranial process. 2. ASPECTS is 10 Code stroke imaging results were communicated on 05/22/2021 at 2:50 am to provider [facility] via secure text paging. Electronically Signed   By: Wiliam Ke M.D.   On: 05/22/2021 02:51    Procedures Procedures   Medications Ordered in ED Medications  aspirin tablet 325 mg (has no administration in time range)  iohexol (OMNIPAQUE) 350 MG/ML injection 50 mL (50 mLs Intravenous Contrast Given 05/22/21 0301)    ED Course  I have reviewed the triage vital signs and the nursing notes.  Pertinent labs & imaging results that were available during my care of the patient were reviewed by me and considered in my medical decision making (see chart for details).   Ernest [name] was evaluated in Emergency Department on 05/22/2021 for the symptoms described in the history of present illness. He was evaluated in the context of the global COVID-19 pandemic, which necessitated consideration that the patient might be at risk for infection with the SARS-CoV-2 virus that causes COVID-19. Institutional protocols and algorithms that pertain to the evaluation of patients at risk for COVID-19 are in a state of rapid change based on information released by regulatory bodies including the CDC and federal and state organizations. These policies and algorithms were followed during the patient's care in the ED.  Final Clinical Impression(s) / ED Diagnoses Final diagnoses:  TIA (transient ischemic attack)   Admit to medicine  Rx / DC Orders ED Discharge Orders     None        Rishav Rockefeller, MD 05/22/21 [phone]

## 2021-05-22 NOTE — ED Notes (Signed)
Neurologist Thomasena Edis MD at bedside

## 2021-05-22 NOTE — ED Notes (Signed)
Patient transported to MRI 

## 2021-05-22 NOTE — ED Triage Notes (Signed)
Nepali interpreter used: Pt here with significant other, who states she was woken up in the night by pt moving his left arm uncontrollably and was unable to speak. At this time, pt speaking, appears A&Ox4, and moves all extremities. Left hand grip weaker than right, and pt unable to lift left side of his mouth. Denies known medical hx, LSN 9:30pm tonight.

## 2021-05-22 NOTE — ED Notes (Signed)
Patient is sitting up in bed eating lunch without difficulty

## 2021-05-22 NOTE — Progress Notes (Addendum)
STROKE TEAM PROGRESS NOTE   ATTENDING NOTE: I reviewed above note and agree with the assessment and plan. Pt was seen and examined.   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  Per patient and his wife, he went to sleep outside and normal baseline, he woke up at 2:30 AM found to have left facial droop, left arm leg weakness and slurred speech.  However, currently patient symptoms much resolved still has slight left facial numbness but arm and leg equal strengths.  CT no acute abnormality.  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  MRA head also confirmed distal M1 stenosis.  MRI showed right BG and CR acute infarct.  EEG normal.  2D echo pending.  A1c pending, UDS pending, LDL 120.  On exam, patient awake alert, oriented x3, no aphasia, slight dysarthria, follows simple commands.  Neuro examination normal except slight left facial droop.  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  Patient has multiple risk factors including morbid obesity, smoker, hyperlipidemia.  Patient snoring at night per wife, high likelihood for OSA with morbid obesity, will refer to outpatient sleep study.  He also drinks "Monster" drinks.  Recommend aggressive stroke risk factor modification, quit smoking.  Continue aspirin 325 and Plavix 75 daily DAPT for 3 months due to intracranial stenosis, and then aspirin alone.  Continue high intensity Lipitor 80.  PT/OT pending.  Will follow.  For detailed assessment and plan, please refer to above as I have made changes wherever appropriate.   I discussed with Dr. Katrinka Blazing. I spent  35 minutes in total face-to-face time with the patient, more than 50% of which was spent in counseling and coordination of care, reviewing test results, images and medication, and discussing the  diagnosis, treatment plan and potential prognosis. This patient's care requiresreview of multiple databases, neurological assessment, discussion with family, other specialists and medical decision making of high complexity. I had long discussion with patient and wife at bedside, updated pt current condition, treatment plan and potential prognosis, and answered all the questions.  They expressed understanding and appreciation.    Marvel Plan, MD PhD Stroke Neurology 05/22/2021 6:38 PM    INTERVAL HISTORY Patient is seen in the ED with his significant other is at the bedside.  Remote interpreter and assistance from family members was used, as patient speaks [place] and limited [place].  Patient is hemodynamically stable.  He appears appropriately worried about his recent symptoms.    Vitals:   05/22/21 0605 05/22/21 0730 05/22/21 0930 05/22/21 1130  BP: 112/71 113/77 118/75 120/72  Pulse: 77 81 84 80  Resp: 19 18 16 18   Temp:  98 F (36.7 C)  98.2 F (36.8 C)  TempSrc:  Oral  Oral  SpO2: 96% 97% 91% 98%   CBC:  Recent Labs  Lab 05/22/21 0240 05/22/21 0246  WBC 11.7*  --   NEUTROABS 5.7  --   HGB 14.7 15.3  HCT 43.8 45.0  MCV 85.5  --   PLT 289  --    Basic Metabolic Panel:  Recent Labs  Lab 05/22/21 0240 05/22/21 0246  NA 135 139  K 3.8 3.7  CL 104 104  CO2 23  --   GLUCOSE 109* 107*  BUN 8 7  CREATININE 0.98 0.90  CALCIUM 8.7*  --  Lipid Panel:  Recent Labs  Lab 05/22/21 0845  CHOL 186  TRIG 88  HDL 48  CHOLHDL 3.9  VLDL 18  LDLCALC 166*   HgbA1c: No results for input(s): HGBA1C in the last 168 hours. pending Urine Drug Screen: No results for input(s): LABOPIA, COCAINSCRNUR, LABBENZ, AMPHETMU, THCU, LABBARB in the last 168 hours. pending Alcohol Level  Recent Labs  Lab 05/22/21 0240  ETH <10    IMAGING past 24 hours DG CHEST PORT 1 VIEW  Result Date: 05/22/2021 CLINICAL DATA:  TIA, weakness EXAM: PORTABLE CHEST 1 VIEW COMPARISON:  None.  FINDINGS: The cardiomediastinal silhouette is normal. There is no focal consolidation or pulmonary edema. There is no pleural effusion or pneumothorax. There is no acute osseous abnormality. IMPRESSION: No radiographic evidence of acute cardiopulmonary process. Electronically Signed   By: Lesia Hausen M.D.   On: 05/22/2021 08:48   EEG adult  Result Date: 05/22/2021 Charlsie Quest, MD     05/22/2021 10:35 AM Patient Name: Jovon Winterhalter MRN: [id number] Epilepsy Attending: Charlsie Quest Referring Physician/Provider: Dr Marisue Humble Date: 05/22/2021 Duration: 23.26 mins Patient history:  35 y.o. male with transient left weakness and slurred speech. EEG to evaluate for seizure. Level of alertness: Awake, asleep AEDs during EEG study: None Technical aspects: This EEG study was done with scalp electrodes positioned according to the 10-20 International system of electrode placement. Electrical activity was acquired at a sampling rate of 500Hz  and reviewed with a high frequency filter of 70Hz  and a low frequency filter of 1Hz . EEG data were recorded continuously and digitally stored. Description: The posterior dominant rhythm consists of 10 Hz activity of moderate voltage (25-35 uV) seen predominantly in posterior head regions, symmetric and reactive to eye opening and eye closing. Sleep was characterized by vertex waves,  maximal frontocentral region. Hyperventilation and photic stimulation were not performed.   IMPRESSION: This study is within normal limits. No seizures or epileptiform discharges were seen throughout the recording.   CT HEAD CODE STROKE WO CONTRAST  Result Date: 05/22/2021 CLINICAL DATA:  Code stroke.  Left-sided weakness EXAM: CT HEAD WITHOUT CONTRAST TECHNIQUE: Contiguous axial images were obtained from the base of the skull through the vertex without intravenous contrast. COMPARISON:  None. FINDINGS: Brain: No evidence of acute infarction, hemorrhage, cerebral edema,  mass, mass effect, or midline shift. Ventricles and sulci are normal for age. No extra-axial fluid collection. Vascular: No hyperdense vessel or unexpected calcification. Skull: Normal. Negative for fracture or focal lesion. Sinuses/Orbits: No acute finding. Other: The mastoid air cells are well aerated. ASPECTS [facility] Stroke Program Early CT Score) - Ganglionic level infarction (caudate, lentiform nuclei, internal capsule, insula, M1-M3 cortex): 7 - Supraganglionic infarction (M4-M6 cortex): 3 Total score (0-10 with 10 being normal): 10 IMPRESSION: 1. No acute intracranial process. 2. ASPECTS is 10 Code stroke imaging results were communicated on 05/22/2021 at 2:50 am to provider [facility] via secure text paging. Electronically Signed   By: [facility] M.D.   On: 05/22/2021 02:51   CT ANGIO HEAD CODE STROKE  Addendum Date: 05/22/2021   ADDENDUM REPORT: 05/22/2021 09:19 ADDENDUM: Study discussed by telephone with Dr. 05/24/2021 at 9:16 am on 05/22/2021. He questions abnormality of the distal Right MCA M1 segment on this exam, and on review there is moderate to severe irregularity and stenosis of the distal Right M1. See series 10, image 21 and series 9, image 78. That MCA bifurcation does remain patent, and Right MCA M2 branches appear  within normal limits. No similar vessel irregularity or stenosis identified. No discrete atherosclerosis elsewhere in the head or neck. Unclear whether the abnormality of the Right MCA represents acute thromboembolic disease versus vaso spasm or other. The patient's NIH score is now approaching zero, and Dr. Roda Shutters and I discussed follow-up Brain MRI with intracranial MRA to re-evaluate. Electronically Signed   By: Odessa Fleming M.D.   On: 05/22/2021 09:19   Result Date: 05/22/2021 CLINICAL DATA:  Left arm weakness and transient shaking, possible left facial droop EXAM: CT ANGIOGRAPHY HEAD AND NECK TECHNIQUE: Multidetector CT imaging of the head and neck was performed using the standard  protocol during bolus administration of intravenous contrast. Multiplanar CT image reconstructions and MIPs were obtained to evaluate the vascular anatomy. Carotid stenosis measurements (when applicable) are obtained utilizing NASCET criteria, using the distal internal carotid diameter as the denominator. CONTRAST:  16mL OMNIPAQUE IOHEXOL 350 MG/ML SOLN COMPARISON:  None. FINDINGS: CT HEAD FINDINGS Please see same-day CT stroke code CTA NECK FINDINGS Aortic arch: 4 vessel arch, with the origin of the left vertebral artery originating from the arch imaged portion shows no evidence of aneurysm or dissection. No significant stenosis of the major arch vessel origins. Right carotid system: No evidence of dissection, stenosis (50% or greater) or occlusion. Left carotid system: No evidence of dissection, stenosis (50% or greater) or occlusion. Retropharyngeal course of the left CCA and ICA. Vertebral arteries: No evidence of dissection, stenosis (50% or greater) or occlusion. Skeleton: No acute osseous abnormality. Other neck: Negative Upper chest: Negative Review of the MIP images confirms the above findings CTA HEAD FINDINGS Anterior circulation: Both internal carotid arteries are patent to the termini, without stenosis or other abnormality. A1 segments patent, with possible stenosis at the origin of the left A1. Normal anterior communicating artery. Anterior cerebral arteries are patent to their distal aspects. No M1 stenosis or occlusion. Normal MCA bifurcations. Distal MCA branches perfused and symmetric. Posterior circulation: Vertebral arteries widely patent to the vertebrobasilar junction without stenosis, with the majority of the left vertebral artery supplying the left PICA. Posterior inferior cerebral arteries patent bilaterally. Basilar patent to its distal aspect. Superior cerebral arteries patent bilaterally. Fetal origin left PCA. PCAs well perfused to their distal aspects without stenosis. The right  posterior communicating artery is not definitively visualized. Venous sinuses: As permitted by contrast timing, patent. Anatomic variants: None significant Review of the MIP images confirms the above findings IMPRESSION: 1. No hemodynamically significant stenosis in the neck. 2. No intracranial large vessel occlusion. Electronically Signed: By: Wiliam Ke M.D. On: 05/22/2021 03:16   CT ANGIO NECK CODE STROKE  Addendum Date: 05/22/2021   ADDENDUM REPORT: 05/22/2021 09:19 ADDENDUM: Study discussed by telephone with Dr. Roda Shutters at 9:16 am on 05/22/2021. He questions abnormality of the distal Right MCA M1 segment on this exam, and on review there is moderate to severe irregularity and stenosis of the distal Right M1. See series 10, image 21 and series 9, image 78. That MCA bifurcation does remain patent, and Right MCA M2 branches appear within normal limits. No similar vessel irregularity or stenosis identified. No discrete atherosclerosis elsewhere in the head or neck. Unclear whether the abnormality of the Right MCA represents acute thromboembolic disease versus vaso spasm or other. The patient's NIH score is now approaching zero, and Dr. Roda Shutters and I discussed follow-up Brain MRI with intracranial MRA to re-evaluate. Electronically Signed   By: Odessa Fleming M.D.   On: 05/22/2021 09:19   Result  Date: 05/22/2021 CLINICAL DATA:  Left arm weakness and transient shaking, possible left facial droop EXAM: CT ANGIOGRAPHY HEAD AND NECK TECHNIQUE: Multidetector CT imaging of the head and neck was performed using the standard protocol during bolus administration of intravenous contrast. Multiplanar CT image reconstructions and MIPs were obtained to evaluate the vascular anatomy. Carotid stenosis measurements (when applicable) are obtained utilizing NASCET criteria, using the distal internal carotid diameter as the denominator. CONTRAST:  27mL OMNIPAQUE IOHEXOL 350 MG/ML SOLN COMPARISON:  None. FINDINGS: CT HEAD FINDINGS Please see  same-day CT stroke code CTA NECK FINDINGS Aortic arch: 4 vessel arch, with the origin of the left vertebral artery originating from the arch imaged portion shows no evidence of aneurysm or dissection. No significant stenosis of the major arch vessel origins. Right carotid system: No evidence of dissection, stenosis (50% or greater) or occlusion. Left carotid system: No evidence of dissection, stenosis (50% or greater) or occlusion. Retropharyngeal course of the left CCA and ICA. Vertebral arteries: No evidence of dissection, stenosis (50% or greater) or occlusion. Skeleton: No acute osseous abnormality. Other neck: Negative Upper chest: Negative Review of the MIP images confirms the above findings CTA HEAD FINDINGS Anterior circulation: Both internal carotid arteries are patent to the termini, without stenosis or other abnormality. A1 segments patent, with possible stenosis at the origin of the left A1. Normal anterior communicating artery. Anterior cerebral arteries are patent to their distal aspects. No M1 stenosis or occlusion. Normal MCA bifurcations. Distal MCA branches perfused and symmetric. Posterior circulation: Vertebral arteries widely patent to the vertebrobasilar junction without stenosis, with the majority of the left vertebral artery supplying the left PICA. Posterior inferior cerebral arteries patent bilaterally. Basilar patent to its distal aspect. Superior cerebral arteries patent bilaterally. Fetal origin left PCA. PCAs well perfused to their distal aspects without stenosis. The right posterior communicating artery is not definitively visualized. Venous sinuses: As permitted by contrast timing, patent. Anatomic variants: None significant Review of the MIP images confirms the above findings IMPRESSION: 1. No hemodynamically significant stenosis in the neck. 2. No intracranial large vessel occlusion. Electronically Signed: By: Wiliam Ke M.D. On: 05/22/2021 03:16    PHYSICAL EXAM Patient is a  well-nourished, alert male in no acute distress. Right arm with sequelae of prior trauma.   Neurological examination:  NEURO:  Mental Status: AA&Ox3  Speech/Language: speech is without dysarthria or aphasia.    Cranial Nerves:  V: Sensation is intact to light touch and symmetrical to face.  VII: Smile is symmetrical. Able to puff cheeks and raise eyebrows.  VIII: hearing intact to voice. XQ:JJHERDEY shrug 5/5. XII: tongue is midline without fasciculations. Motor: 5/5 strength to all muscle groups tested.  Tone: is normal and bulk is normal Sensation- Intact to light touch bilaterally. Extinction absent to light touch to DSS.   Gait- deferred    ASSESSMENT/PLAN Mr. Tarry Fountain is a 35 y.o. 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  He presented to the ED last night after awakening around 0130 with left sided weakness, left sided facial droop and dysphasia.  Symptoms resolved spontaneously, but patient and significant other state that they come and go.  CTA performed reveals narrowing of the right distal M1 segment of the MCA.  EEG performed was negative for epileptiform discharges.  Workup to continue with MRI/MRA and echocardiogram.  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 Code Stroke CT head No acute abnormality. ASPECTS 10.  CTA head & neck narrowing and irregularity of the right distal M1 segment of the MCA MRI brain done shows 3.4 cm acute infarct within the right basal ganglia and right corona radiata. MRA head shows moderate stenosis within the distal M1 right middle cerebral artery. No M2 proximal branch occlusion or high-grade proximal stenosis is identified 2D Echo pending LDL 120 HgbA1c pending VTE prophylaxis - per primary team No antithrombotic prior to admission, now on aspirin 325 and Plavix 75 daily DAPT for 3 months due  to intracranial stenosis, and then aspirin alone.  Therapy recommendations: pending Disposition:  pending, likely to home   Hyperlipidemia Home meds:  none LDL 120, goal < 70 Add atorvastatin 80 mg daily High intensity statin started Continue statin at discharge  Tobacco abuse Current smoker Smoking cessation counseling provided Pt is willing to quit  Other Stroke Risk Factors  Current Cigarette smoker, advised to stop smoking Substance abuse - UDS:  pending Obesity, no weight listed at time of this note.   Highly likely obstructive sleep apnea, will refer to outpatient sleep study  Other Active Problems none  Hospital day # 0   To contact Stroke Continuity provider, please refer to [email or web address]. After hours, contact General Neurology

## 2021-05-22 NOTE — Consult Note (Signed)
Neurology Consult H&P  Maijor Hornig MR# [id number] 05/22/2021   CC: left sided weakness  History is obtained from: patient through interpreter, patient and wife and chart.  HPI: Rowland Ericsson is a 35 y.o. male PMHx as reviewed below who is [place] and woke yesterday morning and his left upper and lower extremities were moving uncontrollably. He states that he became worried and he and his wife came to ED for further evaluation.  On arrival he was noted to have left face arm and leg weakness with slurred speech such that the interpreter had difficulty understanding him.  LKW: unclear as last normal was reported as 2130 but he added that symptoms began in the morning when he woke. tNK given: No to mild IR Thrombectomy No LVO Modified Rankin Scale: 0-Completely asymptomatic and back to baseline post- stroke NIHSS: 0  ROS: A complete ROS was performed and is negative except as noted in the HPI.   History reviewed. No pertinent past medical history.   Family History  Problem Relation Age of Onset   Diabetes Neg Hx    Cancer Neg Hx    Heart failure Neg Hx    Hyperlipidemia Neg Hx    Hypertension Neg Hx     Social History:  reports that he has been smoking. He has been smoking an average of .01 packs per day. He has never used smokeless tobacco. He reports that he does not drink alcohol and does not use drugs.   Prior to Admission medications   Medication Sig Start Date End Date Taking? Authorizing Provider  hydrocortisone cream 1 % Apply to affected area 2 times daily 03/24/19   Robinson, [place] N, PA-C  ibuprofen (ADVIL,MOTRIN) 800 MG tablet Take 1 tablet (800 mg total) by mouth every 8 (eight) hours as needed. 08/07/17   Petrucelli, Samantha R, PA-C  tamsulosin (FLOMAX) 0.4 MG CAPS capsule Take 1 capsule (0.4 mg total) by mouth daily after supper. 08/07/17   Petrucelli, Pleas Koch, PA-C    Exam: Current vital signs: BP 131/90   Pulse 83   Temp 98.1 F (36.7 C) (Oral)    Resp 18   SpO2 100%   Physical Exam  Constitutional: Appears well-developed and well-nourished.  Psych: Affect appropriate to situation Eyes: No scleral injection HENT: No OP obstruction. Head: Normocephalic.  Cardiovascular: Normal rate and regular rhythm.  Respiratory: Effort normal, symmetric excursions bilaterally, no audible wheezing. GI: Soft.  No distension. There is no tenderness.  Skin: WDI  Neuro: Mental Status: Patient is awake, alert, oriented to person, place, month, year, and situation. Patient is able to give a clear and coherent history. Speech appears fluent, intact comprehension and repetition through interpreter. No signs of aphasia or neglect. Visual Fields are full. Pupils are equal, round, and reactive to light. EOMI without ptosis or diploplia.  Facial sensation is symmetric to temperature Facial movement is symmetric.  Hearing is intact to voice. Uvula midline and palate elevates symmetrically. Shoulder shrug is symmetric. Tongue is midline without atrophy or fasciculations.  Tone is normal. Bulk is normal. 5/5 strength was present in all four extremities. Sensation is symmetric to light touch and temperature in the arms and legs. Deep Tendon Reflexes: 2+ and symmetric in the biceps and patellae. Toes are downgoing bilaterally. FNF and HKS are intact bilaterally. Gait - Deferred  I have reviewed labs in epic and the pertinent results are: Ca (ionized) 1.08.  I have reviewed the images obtained: NCT head showed no acute ischemic changes. CTA head  and neck showed no intracranial large vessel occlusion or hemodynamically significant stenosis.  Assessment: Treyvone Chelf is a 35 y.o. male PMHx as noted above with transient left weakness and slurred speech. On evaluation, he was without focal deficits and had returned to baseline however was visibly very anxious. He has high BMI and is a smoker and therefore has vascular risk factors and will need further  TIA evaluation. He is also hypocalcemic and cannot exclude seizure will also pursue EEG to rule out seizure.   Recommended aspirin 324mg  now.  The patient is and speaks limited [place].  Plan: - MRI brain without contrast - Ordered. - Recommend TTE. - Recommend labs: HbA1c, lipid panel, TSH. - Recommend Statin if LDL > 70 - Aspirin 81mg  daily. - Clopidogrel 75mg  daily for 3 weeks. - SBP goal <180. - Telemetry monitoring for arrhythmia. - Recommend bedside Swallow screen. - Recommend Stroke education. - Recommend PT/OT/SLP consult. - Routine EEG to eval for any epileptogenic discharges - Ordered.  This patient is critically ill and at significant risk of neurological worsening, death and care requires constant monitoring of vital signs, hemodynamics,respiratory and cardiac monitoring, neurological assessment, discussion with family, other specialists and medical decision making of high complexity. I spent 73 minutes of neurocritical care time  in the care of  this patient. This was time spent independent of any time provided by nurse practitioner or PA.  Electronically signed by:  [place], MD Page: 05/22/2021, 2:53 AM

## 2021-05-22 NOTE — ED Notes (Signed)
Remains in MRI 

## 2021-05-22 NOTE — ED Notes (Signed)
EEG in progress 

## 2021-05-23 ENCOUNTER — Inpatient Hospital Stay (HOSPITAL_COMMUNITY): Payer: 59

## 2021-05-23 ENCOUNTER — Other Ambulatory Visit (HOSPITAL_COMMUNITY): Payer: Self-pay

## 2021-05-23 DIAGNOSIS — I6389 Other cerebral infarction: Secondary | ICD-10-CM

## 2021-05-23 DIAGNOSIS — E7800 Pure hypercholesterolemia, unspecified: Secondary | ICD-10-CM | POA: Diagnosis not present

## 2021-05-23 DIAGNOSIS — I63511 Cerebral infarction due to unspecified occlusion or stenosis of right middle cerebral artery: Secondary | ICD-10-CM | POA: Diagnosis not present

## 2021-05-23 DIAGNOSIS — G4733 Obstructive sleep apnea (adult) (pediatric): Secondary | ICD-10-CM | POA: Diagnosis not present

## 2021-05-23 LAB — COMPREHENSIVE METABOLIC PANEL
ALT: 69 U/L — ABNORMAL HIGH (ref 0–44)
AST: 45 U/L — ABNORMAL HIGH (ref 15–41)
Albumin: 3.2 g/dL — ABNORMAL LOW (ref 3.5–5.0)
Alkaline Phosphatase: 81 U/L (ref 38–126)
Anion gap: 6 (ref 5–15)
BUN: 9 mg/dL (ref 6–20)
CO2: 25 mmol/L (ref 22–32)
Calcium: 8.6 mg/dL — ABNORMAL LOW (ref 8.9–10.3)
Chloride: 105 mmol/L (ref 98–111)
Creatinine, Ser: 1 mg/dL (ref 0.61–1.24)
GFR, Estimated: >60 mL/min (ref >60)
Glucose, Bld: 93 mg/dL (ref 70–99)
Potassium: 3.7 mmol/L (ref 3.5–5.1)
Sodium: 136 mmol/L (ref 135–145)
Total Bilirubin: 1.2 mg/dL (ref 0.3–1.2)
Total Protein: 6.7 g/dL (ref 6.5–8.1)

## 2021-05-23 LAB — CBC WITH DIFFERENTIAL/PLATELET
Abs Immature Granulocytes: 0.06 10*3/uL (ref 0.00–0.07)
Basophils Absolute: 0.1 10*3/uL (ref 0.0–0.1)
Basophils Relative: 1 %
Eosinophils Absolute: 0.5 10*3/uL (ref 0.0–0.5)
Eosinophils Relative: 4 %
HCT: 41.9 % (ref 39.0–52.0)
Hemoglobin: 14.1 g/dL (ref 13.0–17.0)
Immature Granulocytes: 1 %
Lymphocytes Relative: 36 %
Lymphs Abs: 4 10*3/uL (ref 0.7–4.0)
MCH: 28.9 pg (ref 26.0–34.0)
MCHC: 33.7 g/dL (ref 30.0–36.0)
MCV: 85.9 fL (ref 80.0–100.0)
Monocytes Absolute: 1 10*3/uL (ref 0.1–1.0)
Monocytes Relative: 9 %
Neutro Abs: 5.5 10*3/uL (ref 1.7–7.7)
Neutrophils Relative %: 49 %
Platelets: 287 10*3/uL (ref 150–400)
RBC: 4.88 MIL/uL (ref 4.22–5.81)
RDW: 13.8 % (ref 11.5–15.5)
WBC: 11.1 10*3/uL — ABNORMAL HIGH (ref 4.0–10.5)
nRBC: 0 % (ref 0.0–0.2)

## 2021-05-23 LAB — RAPID URINE DRUG SCREEN, HOSP PERFORMED
Amphetamines: NOT DETECTED
Barbiturates: NOT DETECTED
Benzodiazepines: NOT DETECTED
Cocaine: NOT DETECTED
Opiates: NOT DETECTED
Tetrahydrocannabinol: NOT DETECTED

## 2021-05-23 LAB — ECHOCARDIOGRAM COMPLETE
AR max vel: 2.65 cm2
AV Area VTI: 2.51 cm2
AV Area mean vel: 2.46 cm2
AV Mean grad: 4 mmHg
AV Peak grad: 7.5 mmHg
Ao pk vel: 1.37 m/s
Area-P 1/2: 3.5 cm2
Height: 63 in
S' Lateral: 3 cm
Weight: 5008.85 oz

## 2021-05-23 LAB — HEMOGLOBIN A1C
Hgb A1c MFr Bld: 5.5 % (ref 4.8–5.6)
Mean Plasma Glucose: 111 mg/dL

## 2021-05-23 MED ORDER — ASPIRIN 325 MG PO TBEC
325.0000 mg | DELAYED_RELEASE_TABLET | Freq: Every day | ORAL | 0 refills | Status: DC
  Filled 2021-05-23: qty 30, 30d supply, fill #0

## 2021-05-23 MED ORDER — CLOPIDOGREL BISULFATE 75 MG PO TABS
75.0000 mg | ORAL_TABLET | Freq: Every day | ORAL | 2 refills | Status: DC
  Filled 2021-05-23: qty 30, 30d supply, fill #0

## 2021-05-23 MED ORDER — ATORVASTATIN CALCIUM 80 MG PO TABS
80.0000 mg | ORAL_TABLET | Freq: Every day | ORAL | 3 refills | Status: DC
  Filled 2021-05-23: qty 30, 30d supply, fill #0

## 2021-05-23 NOTE — Discharge Instructions (Signed)
Ernest [name] was admitted to the Hospital on 05/22/2021 and Discharged on Discharge Date 05/23/2021 and should be excused from work/school   for 7  days starting 05/22/2021 , may return to work/school without any restrictions.  Call Lambert Keto MD, [facility] [phone] with questions.  Marinda Elk M.D on 05/23/2021,at 1:21 PM  [facility] Office  [phone]

## 2021-05-23 NOTE — Progress Notes (Signed)
  Echocardiogram 2D Echocardiogram has been performed.  Delcie Roch 05/23/2021, 8:57 AM

## 2021-05-23 NOTE — Evaluation (Signed)
Speech Language Pathology Evaluation Patient Details Name: Ernest [name] MRN: [id number] DOB: April 18, 1986 Today's Date: 05/23/2021 Time: 1215-1228 SLP Time Calculation (min) (ACUTE ONLY): 13 min  Problem List:  Patient Active Problem List   Diagnosis Date Noted   CVA (cerebral vascular accident) (HCC) 05/22/2021   Obesity, Class III, BMI 40-49.9 (morbid obesity) (HCC) 05/22/2021   Leukocytosis 05/22/2021   Hypocalcemia 05/22/2021   Transaminitis 05/22/2021   Compartment syndrome (HCC) 05/22/2021   Hyperlipidemia 05/22/2021   OSA (obstructive sleep apnea)    Smoker    Past Medical History:  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HCC)    Past Surgical History:  Past Surgical History:  Procedure Laterality Date   DECOMPRESSION FASCIOTOMY FOREARM Right    HPI:  Patient is a 35 y/o male who presents on 05/22/21 with left sided weakness and slurred speech. 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 PMH includes compartment syndrome RUE, obesity, smoker.   Assessment / Plan / Recommendation Clinical Impression  Ernest [name], whose primary language is Nepalese, was able to participate in cognitive/language assessment with occasional translation from girlfriend at bedside. They both assert that speech and cognition are back to baseline - output was clear and fluent.  Oriented x 4; short-term recall and awareness were WFL. Expressive/receptive language intact, as would be expected from site of lesion. No dysarthria.  No SLP f/u is needed.  Pt verbalizes agreement.  Our service will sign off.    SLP Assessment  SLP Recommendation/Assessment: Patient does not need any further Speech Lanaguage Pathology Services SLP Visit Diagnosis: Cognitive communication deficit (R41.841)    Recommendations for follow up therapy are one component of a multi-disciplinary discharge planning process, led by the  attending physician.  Recommendations may be updated based on patient status, additional functional criteria and insurance authorization.    Follow Up Recommendations  None    Frequency and Duration           SLP Evaluation Cognition  Overall Cognitive Status: Within Functional Limits for tasks assessed Arousal/Alertness: Awake/alert Orientation Level: Oriented X4 Attention: Selective Selective Attention: Appears intact Memory: Appears intact Awareness: Appears intact Safety/Judgment: Appears intact       Comprehension  Auditory Comprehension Overall Auditory Comprehension: Appears within functional limits for tasks assessed Visual Recognition/Discrimination Discrimination: Not tested Reading Comprehension Reading Status: Not tested    Expression Expression Primary Mode of Expression: Verbal Verbal Expression Overall Verbal Expression: Appears within functional limits for tasks assessed Written Expression Dominant Hand: Right   Oral / Motor  Oral Motor/Sensory Function Overall Oral Motor/Sensory Function: Within functional limits Motor Speech Overall Motor Speech: Appears within functional limits for tasks assessed                      Ernest Warren L. Samson Frederic, MA CCC/SLP Acute Rehabilitation Services Office number [phone] Pager [phone]  Carolan Shiver 05/23/2021, 12:35 PM

## 2021-05-23 NOTE — Discharge Summary (Addendum)
Physician Discharge Summary  Ernest [name] WGN:[id number] DOB: [birth year] DOA: 05/22/2021  PCP: Patient, No Pcp Per (Inactive)  Admit date: 05/22/2021 Discharge date: 05/23/2021  Admitted From: Home Disposition:  Home  Recommendations for Outpatient Follow-up:  Follow up with Neurology in 1-2 weeks Please obtain BMP/CBC in one week He probably has undiagnosed obstructive sleep apnea will need to follow-up with PCP and schedule a sleep study as an outpatient.   Home Health:No Equipment/Devices:None Discharge Condition:Stable CODE STATUS:Full Diet recommendation: Heart Healthy   Brief/Interim Summary: 35 y.o. male Nepalese speaking with past medical history of compartment syndrome in the right arm obesity comes in for left-sided weakness last known to be normal 930 the day prior to admission woke up unable to use his left side facial droop and slurred speech.  CT of the head showed no acute abnormalities, CTA of head and neck did not show any large vessel occlusion neurology was consulted who deemed him not a candidate for tPA.  I of the brain showed a 3.4 cm acute infarct of the right basal ganglia and right corona radiata there is significant hyperintensity in T2 flair in within the bilateral cerebral white matter overall mild but advanced for his age.  MRA of the head showed no large vessel occlusion  Discharge Diagnoses:  Principal Problem:   CVA (cerebral vascular accident) (HCC) Active Problems:   Obesity, Class III, BMI 40-49.9 (morbid obesity) (HCC)   Leukocytosis   Hypocalcemia   Transaminitis   Compartment syndrome (HCC)   Hyperlipidemia   OSA (obstructive sleep apnea)   Smoker  Acute CVA: A1c is pending, DL 784 HDL greater than 40 he was started on high-dose statin. MRI of the brain showed 3.4 cm infarct with chronic small vessel ischemic changes. Neurology was consulted CTA reviewed with the radiologist showing M1 segment stenosis that appears to be  severe. Physical therapy evaluated the patient recommended no home follow-up. 2D echo showed no acute abnormalities preserved EF. He was started on aspirin and Plavix which she will continue for 3 months then discontinue Plavix and continue aspirin 325 as per neurology recommendations.  Leukocytosis: Likely reactive due to CVA HIV negative.  Pseudo hypocalcemia: Normal corrected for his albumin.  Elevated LFTs: Acute otitis panel Tylenol detected, likely due to fatty liver.  Dyslipidemia: Continue statins.  Morbid obesity: Counseling was performed to more than 30 minutes.  Discharge Instructions  Discharge Instructions     Ambulatory referral to Neurology   Complete by: As directed    Follow up with stroke clinic NP (Jessica Vanschaick or Darrol Angel, if both not available, consider Manson Allan, or Ahern) at [facility] in about 4 weeks. Thanks.   Ambulatory referral to Neurology   Complete by: As directed    Sleep apnea evaluation in the setting of stroke. Thanks.   Diet - low sodium heart healthy   Complete by: As directed    Increase activity slowly   Complete by: As directed       Allergies as of 05/23/2021   No Known Allergies      Medication List     TAKE these medications    acetaminophen 500 MG tablet Commonly known as: TYLENOL Take 1,000 mg by mouth every 6 (six) hours as needed for pain.   aspirin 325 MG EC tablet Take 1 tablet (325 mg total) by mouth daily.   atorvastatin 80 MG tablet Commonly known as: LIPITOR Take 1 tablet (80 mg total) by mouth daily.   clopidogrel 75 MG tablet  Commonly known as: PLAVIX Take 1 tablet (75 mg total) by mouth daily.        Follow-up Information     [facility] Follow up in 1 month(s).   Specialty: Neurology Why: stroke clinic Contact information: [address] [place] [address] [phone]        Dohmeier, Porfirio Mylar, MD. Schedule an appointment as soon  as possible for a visit in 2 week(s).   Specialty: Neurology Why: sleep apnea assessment Contact information: [address] [place] [address] [phone]                No Known Allergies  Consultations: Neurology   Procedures/Studies: MR ANGIO HEAD WO CONTRAST  Result Date: 05/22/2021 CLINICAL DATA:  Stroke, follow-up. Additional history provided: Left arm weakness and transient shaking, possible left facial droop. EXAM: MRI HEAD WITHOUT AND WITH CONTRAST MRA HEAD WITHOUT CONTRAST TECHNIQUE: Multiplanar, multi-echo pulse sequences of the brain and surrounding structures were acquired without and with intravenous contrast. Angiographic images of the Circle of Willis were acquired using MRA technique without intravenous contrast. CONTRAST:  3mL GADAVIST GADOBUTROL 1 MMOL/ML IV SOLN COMPARISON:  Noncontrast head CT and CT angiogram head/neck 05/22/2021. FINDINGS: MRI HEAD FINDINGS Brain: Cerebral volume is normal. Acute infarct measuring 3.4 cm in greatest dimension within the right caudate and lentiform nuclei and right corona radiata. Background multifocal T2 FLAIR hyperintense signal abnormality within the bilateral cerebral white matter, overall mild but significantly advanced for age. The hippocampi are symmetric in size and signal. No evidence of an intracranial mass. No chronic intracranial blood products. No extra-axial fluid collection. No midline shift. No pathologic intracranial enhancement identified. Vascular: Reported below. Skull and upper cervical spine: No focal suspicious marrow lesion. Sinuses/Orbits: Rightward gaze. Complete T2 hyperintense opacification of the right frontal sinus. Extensive T2 hyperintense opacification of the left frontal sinus. Scattered mucosal thickening and fluid within the left greater than right ethmoid air cells. Frothy secretions and mild mucosal thickening within the right sphenoid sinus. Mild mucosal thickening within the  left sphenoid sinus. Extensive partial opacification of the bilateral maxillary sinuses secondary to the presence mucosal thickening and large fluid levels. Other: Fluid within the right middle ear/mastoid air cells. Trace fluid also present within the left mastoid air cells. MRA HEAD FINDINGS Anterior circulation: The intracranial internal carotid arteries are patent. The M1 middle cerebral arteries are patent. Redemonstrated moderate stenosis within the distal M1 right middle cerebral artery. No M2 proximal branch occlusion or high-grade proximal stenosis is identified. The anterior cerebral arteries are patent. No intracranial aneurysm is identified. Posterior circulation: The intracranial vertebral arteries are patent. Dominant right vertebral artery. The basilar artery is patent. The posterior cerebral arteries are patent. A sizable left posterior communicating artery is present. The right posterior communicating artery is diminutive or absent. Anatomic variants: As described. IMPRESSION: MRI brain: 1. 3.4 cm acute infarct within the right basal ganglia and right corona radiata. 2. Background multifocal T2 FLAIR hyperintense signal abnormality within the bilateral cerebral white matter, overall mild but significantly advanced for age. These signal changes are nonspecific and differential considerations include age-advanced chronic small vessel ischemic disease, sequela of chronic migraine headaches, sequela of a prior infectious/inflammatory process or less likely sequela of a demyelinating process (such as multiple sclerosis), among others. 3. Extensive paranasal sinus disease, as described. Correlate for acute sinusitis. 4. Right greater than left middle ear/mastoid effusions. MRA head: 1. No intracranial large vessel occlusion is identified. 2. Redemonstrated moderate  stenosis within the distal M1 segment of the right middle cerebral artery. Electronically Signed   By: Jackey Loge D.O.   On: 05/22/2021  13:32   MR BRAIN W WO CONTRAST  Result Date: 05/22/2021 CLINICAL DATA:  Stroke, follow-up. Additional history provided: Left arm weakness and transient shaking, possible left facial droop. EXAM: MRI HEAD WITHOUT AND WITH CONTRAST MRA HEAD WITHOUT CONTRAST TECHNIQUE: Multiplanar, multi-echo pulse sequences of the brain and surrounding structures were acquired without and with intravenous contrast. Angiographic images of the Circle of Willis were acquired using MRA technique without intravenous contrast. CONTRAST:  10mL GADAVIST GADOBUTROL 1 MMOL/ML IV SOLN COMPARISON:  Noncontrast head CT and CT angiogram head/neck 05/22/2021. FINDINGS: MRI HEAD FINDINGS Brain: Cerebral volume is normal. Acute infarct measuring 3.4 cm in greatest dimension within the right caudate and lentiform nuclei and right corona radiata. Background multifocal T2 FLAIR hyperintense signal abnormality within the bilateral cerebral white matter, overall mild but significantly advanced for age. The hippocampi are symmetric in size and signal. No evidence of an intracranial mass. No chronic intracranial blood products. No extra-axial fluid collection. No midline shift. No pathologic intracranial enhancement identified. Vascular: Reported below. Skull and upper cervical spine: No focal suspicious marrow lesion. Sinuses/Orbits: Rightward gaze. Complete T2 hyperintense opacification of the right frontal sinus. Extensive T2 hyperintense opacification of the left frontal sinus. Scattered mucosal thickening and fluid within the left greater than right ethmoid air cells. Frothy secretions and mild mucosal thickening within the right sphenoid sinus. Mild mucosal thickening within the left sphenoid sinus. Extensive partial opacification of the bilateral maxillary sinuses secondary to the presence mucosal thickening and large fluid levels. Other: Fluid within the right middle ear/mastoid air cells. Trace fluid also present within the left mastoid air  cells. MRA HEAD FINDINGS Anterior circulation: The intracranial internal carotid arteries are patent. The M1 middle cerebral arteries are patent. Redemonstrated moderate stenosis within the distal M1 right middle cerebral artery. No M2 proximal branch occlusion or high-grade proximal stenosis is identified. The anterior cerebral arteries are patent. No intracranial aneurysm is identified. Posterior circulation: The intracranial vertebral arteries are patent. Dominant right vertebral artery. The basilar artery is patent. The posterior cerebral arteries are patent. A sizable left posterior communicating artery is present. The right posterior communicating artery is diminutive or absent. Anatomic variants: As described. IMPRESSION: MRI brain: 1. 3.4 cm acute infarct within the right basal ganglia and right corona radiata. 2. Background multifocal T2 FLAIR hyperintense signal abnormality within the bilateral cerebral white matter, overall mild but significantly advanced for age. These signal changes are nonspecific and differential considerations include age-advanced chronic small vessel ischemic disease, sequela of chronic migraine headaches, sequela of a prior infectious/inflammatory process or less likely sequela of a demyelinating process (such as multiple sclerosis), among others. 3. Extensive paranasal sinus disease, as described. Correlate for acute sinusitis. 4. Right greater than left middle ear/mastoid effusions. MRA head: 1. No intracranial large vessel occlusion is identified. 2. Redemonstrated moderate stenosis within the distal M1 segment of the right middle cerebral artery. Electronically Signed   By: Jackey Loge D.O.   On: 05/22/2021 13:32   DG CHEST PORT 1 VIEW  Result Date: 05/22/2021 CLINICAL DATA:  TIA, weakness EXAM: PORTABLE CHEST 1 VIEW COMPARISON:  None. FINDINGS: The cardiomediastinal silhouette is normal. There is no focal consolidation or pulmonary edema. There is no pleural effusion or  pneumothorax. There is no acute osseous abnormality. IMPRESSION: No radiographic evidence of acute cardiopulmonary process. Electronically Signed   By:  Lesia Hausen M.D.   On: 05/22/2021 08:48   EEG adult  Result Date: 05/22/2021 Charlsie Quest, MD     05/22/2021 10:35 AM Patient Name: Teandre Hamre MRN: [id number] Epilepsy Attending: Charlsie Quest Referring Physician/Provider: Dr Marisue Humble Date: 05/22/2021 Duration: 23.26 mins Patient history:  35 y.o. male with transient left weakness and slurred speech. EEG to evaluate for seizure. Level of alertness: Awake, asleep AEDs during EEG study: None Technical aspects: This EEG study was done with scalp electrodes positioned according to the 10-20 International system of electrode placement. Electrical activity was acquired at a sampling rate of 500Hz  and reviewed with a high frequency filter of 70Hz  and a low frequency filter of 1Hz . EEG data were recorded continuously and digitally stored. Description: The posterior dominant rhythm consists of 10 Hz activity of moderate voltage (25-35 uV) seen predominantly in posterior head regions, symmetric and reactive to eye opening and eye closing. Sleep was characterized by vertex waves,  maximal frontocentral region. Hyperventilation and photic stimulation were not performed.   IMPRESSION: This study is within normal limits. No seizures or epileptiform discharges were seen throughout the recording.   CT HEAD CODE STROKE WO CONTRAST  Result Date: 05/22/2021 CLINICAL DATA:  Code stroke.  Left-sided weakness EXAM: CT HEAD WITHOUT CONTRAST TECHNIQUE: Contiguous axial images were obtained from the base of the skull through the vertex without intravenous contrast. COMPARISON:  None. FINDINGS: Brain: No evidence of acute infarction, hemorrhage, cerebral edema, mass, mass effect, or midline shift. Ventricles and sulci are normal for age. No extra-axial fluid collection. Vascular: No hyperdense  vessel or unexpected calcification. Skull: Normal. Negative for fracture or focal lesion. Sinuses/Orbits: No acute finding. Other: The mastoid air cells are well aerated. ASPECTS [facility] Stroke Program Early CT Score) - Ganglionic level infarction (caudate, lentiform nuclei, internal capsule, insula, M1-M3 cortex): 7 - Supraganglionic infarction (M4-M6 cortex): 3 Total score (0-10 with 10 being normal): 10 IMPRESSION: 1. No acute intracranial process. 2. ASPECTS is 10 Code stroke imaging results were communicated on 05/22/2021 at 2:50 am to provider [facility] via secure text paging. Electronically Signed   By: [facility] M.D.   On: 05/22/2021 02:51   CT ANGIO HEAD CODE STROKE  Addendum Date: 05/22/2021   ADDENDUM REPORT: 05/22/2021 09:19 ADDENDUM: Study discussed by telephone with Dr. 05/24/2021 at 9:16 am on 05/22/2021. He questions abnormality of the distal Right MCA M1 segment on this exam, and on review there is moderate to severe irregularity and stenosis of the distal Right M1. See series 10, image 21 and series 9, image 78. That MCA bifurcation does remain patent, and Right MCA M2 branches appear within normal limits. No similar vessel irregularity or stenosis identified. No discrete atherosclerosis elsewhere in the head or neck. Unclear whether the abnormality of the Right MCA represents acute thromboembolic disease versus vaso spasm or other. The patient's NIH score is now approaching zero, and Dr. 05/24/2021 and I discussed follow-up Brain MRI with intracranial MRA to re-evaluate. Electronically Signed   By: Roda Shutters M.D.   On: 05/22/2021 09:19   Result Date: 05/22/2021 CLINICAL DATA:  Left arm weakness and transient shaking, possible left facial droop EXAM: CT ANGIOGRAPHY HEAD AND NECK TECHNIQUE: Multidetector CT imaging of the head and neck was performed using the standard protocol during bolus administration of intravenous contrast. Multiplanar CT image reconstructions and MIPs were obtained to evaluate the  vascular anatomy. Carotid stenosis measurements (when applicable) are obtained utilizing NASCET criteria, using  the distal internal carotid diameter as the denominator. CONTRAST:  50mL OMNIPAQUE IOHEXOL 350 MG/ML SOLN COMPARISON:  None. FINDINGS: CT HEAD FINDINGS Please see same-day CT stroke code CTA NECK FINDINGS Aortic arch: 4 vessel arch, with the origin of the left vertebral artery originating from the arch imaged portion shows no evidence of aneurysm or dissection. No significant stenosis of the major arch vessel origins. Right carotid system: No evidence of dissection, stenosis (50% or greater) or occlusion. Left carotid system: No evidence of dissection, stenosis (50% or greater) or occlusion. Retropharyngeal course of the left CCA and ICA. Vertebral arteries: No evidence of dissection, stenosis (50% or greater) or occlusion. Skeleton: No acute osseous abnormality. Other neck: Negative Upper chest: Negative Review of the MIP images confirms the above findings CTA HEAD FINDINGS Anterior circulation: Both internal carotid arteries are patent to the termini, without stenosis or other abnormality. A1 segments patent, with possible stenosis at the origin of the left A1. Normal anterior communicating artery. Anterior cerebral arteries are patent to their distal aspects. No M1 stenosis or occlusion. Normal MCA bifurcations. Distal MCA branches perfused and symmetric. Posterior circulation: Vertebral arteries widely patent to the vertebrobasilar junction without stenosis, with the majority of the left vertebral artery supplying the left PICA. Posterior inferior cerebral arteries patent bilaterally. Basilar patent to its distal aspect. Superior cerebral arteries patent bilaterally. Fetal origin left PCA. PCAs well perfused to their distal aspects without stenosis. The right posterior communicating artery is not definitively visualized. Venous sinuses: As permitted by contrast timing, patent. Anatomic variants: None  significant Review of the MIP images confirms the above findings IMPRESSION: 1. No hemodynamically significant stenosis in the neck. 2. No intracranial large vessel occlusion. Electronically Signed: By: Wiliam Ke M.D. On: 05/22/2021 03:16   CT ANGIO NECK CODE STROKE  Addendum Date: 05/22/2021   ADDENDUM REPORT: 05/22/2021 09:19 ADDENDUM: Study discussed by telephone with Dr. Roda Shutters at 9:16 am on 05/22/2021. He questions abnormality of the distal Right MCA M1 segment on this exam, and on review there is moderate to severe irregularity and stenosis of the distal Right M1. See series 10, image 21 and series 9, image 78. That MCA bifurcation does remain patent, and Right MCA M2 branches appear within normal limits. No similar vessel irregularity or stenosis identified. No discrete atherosclerosis elsewhere in the head or neck. Unclear whether the abnormality of the Right MCA represents acute thromboembolic disease versus vaso spasm or other. The patient's NIH score is now approaching zero, and Dr. Roda Shutters and I discussed follow-up Brain MRI with intracranial MRA to re-evaluate. Electronically Signed   By: Odessa Fleming M.D.   On: 05/22/2021 09:19   Result Date: 05/22/2021 CLINICAL DATA:  Left arm weakness and transient shaking, possible left facial droop EXAM: CT ANGIOGRAPHY HEAD AND NECK TECHNIQUE: Multidetector CT imaging of the head and neck was performed using the standard protocol during bolus administration of intravenous contrast. Multiplanar CT image reconstructions and MIPs were obtained to evaluate the vascular anatomy. Carotid stenosis measurements (when applicable) are obtained utilizing NASCET criteria, using the distal internal carotid diameter as the denominator. CONTRAST:  50mL OMNIPAQUE IOHEXOL 350 MG/ML SOLN COMPARISON:  None. FINDINGS: CT HEAD FINDINGS Please see same-day CT stroke code CTA NECK FINDINGS Aortic arch: 4 vessel arch, with the origin of the left vertebral artery originating from the arch  imaged portion shows no evidence of aneurysm or dissection. No significant stenosis of the major arch vessel origins. Right carotid system: No evidence of dissection, stenosis (  50% or greater) or occlusion. Left carotid system: No evidence of dissection, stenosis (50% or greater) or occlusion. Retropharyngeal course of the left CCA and ICA. Vertebral arteries: No evidence of dissection, stenosis (50% or greater) or occlusion. Skeleton: No acute osseous abnormality. Other neck: Negative Upper chest: Negative Review of the MIP images confirms the above findings CTA HEAD FINDINGS Anterior circulation: Both internal carotid arteries are patent to the termini, without stenosis or other abnormality. A1 segments patent, with possible stenosis at the origin of the left A1. Normal anterior communicating artery. Anterior cerebral arteries are patent to their distal aspects. No M1 stenosis or occlusion. Normal MCA bifurcations. Distal MCA branches perfused and symmetric. Posterior circulation: Vertebral arteries widely patent to the vertebrobasilar junction without stenosis, with the majority of the left vertebral artery supplying the left PICA. Posterior inferior cerebral arteries patent bilaterally. Basilar patent to its distal aspect. Superior cerebral arteries patent bilaterally. Fetal origin left PCA. PCAs well perfused to their distal aspects without stenosis. The right posterior communicating artery is not definitively visualized. Venous sinuses: As permitted by contrast timing, patent. Anatomic variants: None significant Review of the MIP images confirms the above findings IMPRESSION: 1. No hemodynamically significant stenosis in the neck. 2. No intracranial large vessel occlusion. Electronically Signed: By: Wiliam Ke M.D. On: 05/22/2021 03:16     Subjective: No new complaints  Discharge Exam: Vitals:   05/23/21 0431 05/23/21 0735  BP: 133/89 (!) 124/91  Pulse: 79 81  Resp: 17   Temp: 98 F (36.7 C)  98.1 F (36.7 C)  SpO2: 97% 98%   Vitals:   05/22/21 2233 05/22/21 2326 05/23/21 0431 05/23/21 0735  BP: 133/84 119/81 133/89 (!) 124/91  Pulse: 71 85 79 81  Resp: 20 18 17    Temp: 97.8 F (36.6 C) 97.6 F (36.4 C) 98 F (36.7 C) 98.1 F (36.7 C)  TempSrc: Oral  Oral Oral  SpO2: 99% 99% 97% 98%  Weight:      Height:        General: Pt is alert, awake, not in acute distress Cardiovascular: RRR, S1/S2 +, no rubs, no gallops Respiratory: CTA bilaterally, no wheezing, no rhonchi Abdominal: Soft, NT, ND, bowel sounds + Extremities: no edema, no cyanosis    The results of significant diagnostics from this hospitalization (including imaging, microbiology, ancillary and laboratory) are listed below for reference.     Microbiology: Recent Results (from the past 240 hour(s))  Resp Panel by RT-PCR (Flu A&B, Covid) Nasopharyngeal Swab     Status: None   Collection Time: 05/22/21  2:38 AM   Specimen: Nasopharyngeal Swab; Nasopharyngeal(NP) swabs in vial transport medium  Result Value Ref Range Status   SARS Coronavirus 2 by RT PCR NEGATIVE NEGATIVE Final    Comment: (NOTE) SARS-CoV-2 target nucleic acids are NOT DETECTED.  The SARS-CoV-2 RNA is generally detectable in upper respiratory specimens during the acute phase of infection. The lowest concentration of SARS-CoV-2 viral copies this assay can detect is 138 copies/mL. A negative result does not preclude SARS-Cov-2 infection and should not be used as the sole basis for treatment or other patient management decisions. A negative result may occur with  improper specimen collection/handling, submission of specimen other than nasopharyngeal swab, presence of viral mutation(s) within the areas targeted by this assay, and inadequate number of viral copies(<138 copies/mL). A negative result must be combined with clinical observations, patient history, and epidemiological information. The expected result is Negative.  Fact Sheet  for Patients:  05/24/21  Fact Sheet for Healthcare Providers:  SeriousBroker.it  This test is no t yet approved or cleared by the [place] FDA and  has been authorized for detection and/or diagnosis of SARS-CoV-2 by FDA under an Emergency Use Authorization (EUA). This EUA will remain  in effect (meaning this test can be used) for the duration of the COVID-19 declaration under Section 564(b)(1) of the Act, 21 U.S.C.section 360bbb-3(b)(1), unless the authorization is terminated  or revoked sooner.       Influenza A by PCR NEGATIVE NEGATIVE Final   Influenza B by PCR NEGATIVE NEGATIVE Final    Comment: (NOTE) The Xpert Xpress SARS-CoV-2/FLU/RSV plus assay is intended as an aid in the diagnosis of influenza from Nasopharyngeal swab specimens and should not be used as a sole basis for treatment. Nasal washings and aspirates are unacceptable for Xpert Xpress SARS-CoV-2/FLU/RSV testing.  Fact Sheet for Patients: BloggerCourse.com  Fact Sheet for Healthcare Providers: SeriousBroker.it  This test is not yet approved or cleared by the [place] FDA and has been authorized for detection and/or diagnosis of SARS-CoV-2 by FDA under an Emergency Use Authorization (EUA). This EUA will remain in effect (meaning this test can be used) for the duration of the COVID-19 declaration under Section 564(b)(1) of the Act, 21 U.S.C. section 360bbb-3(b)(1), unless the authorization is terminated or revoked.  Performed at [facility], [address], [place], [place] [address]      Labs: BNP (last 3 results) No results for input(s): BNP in the last 8760 hours. Basic Metabolic Panel: Recent Labs  Lab 05/22/21 0240 05/22/21 0246 05/23/21 0150  NA 135 139 136  K 3.8 3.7 3.7  CL 104 104 105  CO2 23  --  25  GLUCOSE 109* 107* 93  BUN CREATININE 0.98 0.90 1.00   CALCIUM 8.7*  --  8.6*   Liver Function Tests: Recent Labs  Lab 05/22/21 0240 05/23/21 0150  AST 53* 45*  ALT 73* 69*  ALKPHOS 92 81  BILITOT 0.7 1.2  PROT 7.3 6.7  ALBUMIN 3.4* 3.2*   No results for input(s): LIPASE, AMYLASE in the last 168 hours. No results for input(s): AMMONIA in the last 168 hours. CBC: Recent Labs  Lab 05/22/21 0240 05/22/21 0246 05/23/21 0150  WBC 11.7*  --  11.1*  NEUTROABS 5.7  --  5.5  HGB 14.7 15.3 14.1  HCT 43.8 45.0 41.9  MCV 85.5  --  85.9  PLT 289  --  287   Cardiac Enzymes: No results for input(s): CKTOTAL, CKMB, CKMBINDEX, TROPONINI in the last 168 hours. BNP: Invalid input(s): POCBNP CBG: Recent Labs  Lab 05/22/21 0305  GLUCAP 108*   D-Dimer No results for input(s): DDIMER in the last 72 hours. Hgb A1c No results for input(s): HGBA1C in the last 72 hours. Lipid Profile Recent Labs    05/22/21 0845  CHOL 186  HDL 48  LDLCALC 120*  TRIG 88  CHOLHDL 3.9   Thyroid function studies Recent Labs    05/22/21 0845  TSH 0.687   Anemia work up No results for input(s): VITAMINB12, FOLATE, FERRITIN, TIBC, IRON, RETICCTPCT in the last 72 hours. Urinalysis    Component Value Date/Time   COLORURINE YELLOW 08/07/2017 1150   APPEARANCEUR CLEAR 08/07/2017 1150   LABSPEC 1.025 08/07/2017 1150   PHURINE 6.0 08/07/2017 1150   GLUCOSEU NEGATIVE 08/07/2017 1150   HGBUR MODERATE (A) 08/07/2017 1150   BILIRUBINUR NEGATIVE 08/07/2017 1150   KETONESUR NEGATIVE 08/07/2017  1150   PROTEINUR 100 (A) 08/07/2017 1150   NITRITE NEGATIVE 08/07/2017 1150   LEUKOCYTESUR NEGATIVE 08/07/2017 1150   Sepsis Labs Invalid input(s): PROCALCITONIN,  WBC,  LACTICIDVEN Microbiology Recent Results (from the past 240 hour(s))  Resp Panel by RT-PCR (Flu A&B, Covid) Nasopharyngeal Swab     Status: None   Collection Time: 05/22/21  2:38 AM   Specimen: Nasopharyngeal Swab; Nasopharyngeal(NP) swabs in vial transport medium  Result Value Ref Range  Status   SARS Coronavirus 2 by RT PCR NEGATIVE NEGATIVE Final    Comment: (NOTE) SARS-CoV-2 target nucleic acids are NOT DETECTED.  The SARS-CoV-2 RNA is generally detectable in upper respiratory specimens during the acute phase of infection. The lowest concentration of SARS-CoV-2 viral copies this assay can detect is 138 copies/mL. A negative result does not preclude SARS-Cov-2 infection and should not be used as the sole basis for treatment or other patient management decisions. A negative result may occur with  improper specimen collection/handling, submission of specimen other than nasopharyngeal swab, presence of viral mutation(s) within the areas targeted by this assay, and inadequate number of viral copies(<138 copies/mL). A negative result must be combined with clinical observations, patient history, and epidemiological information. The expected result is Negative.  Fact Sheet for Patients:  BloggerCourse.com  Fact Sheet for Healthcare Providers:  SeriousBroker.it  This test is no t yet approved or cleared by the [place] FDA and  has been authorized for detection and/or diagnosis of SARS-CoV-2 by FDA under an Emergency Use Authorization (EUA). This EUA will remain  in effect (meaning this test can be used) for the duration of the COVID-19 declaration under Section 564(b)(1) of the Act, 21 U.S.C.section 360bbb-3(b)(1), unless the authorization is terminated  or revoked sooner.       Influenza A by PCR NEGATIVE NEGATIVE Final   Influenza B by PCR NEGATIVE NEGATIVE Final    Comment: (NOTE) The Xpert Xpress SARS-CoV-2/FLU/RSV plus assay is intended as an aid in the diagnosis of influenza from Nasopharyngeal swab specimens and should not be used as a sole basis for treatment. Nasal washings and aspirates are unacceptable for Xpert Xpress SARS-CoV-2/FLU/RSV testing.  Fact Sheet for  Patients: BloggerCourse.com  Fact Sheet for Healthcare Providers: SeriousBroker.it  This test is not yet approved or cleared by the [place] FDA and has been authorized for detection and/or diagnosis of SARS-CoV-2 by FDA under an Emergency Use Authorization (EUA). This EUA will remain in effect (meaning this test can be used) for the duration of the COVID-19 declaration under Section 564(b)(1) of the Act, 21 U.S.C. section 360bbb-3(b)(1), unless the authorization is terminated or revoked.  Performed at [facility], [address], [place], [place] [address]      SIGNED:   Marinda Elk, MD  [facility] 05/23/2021, 9:41 AM Pager   If 7PM-7AM, please contact night-coverage [email or web address] Password [email or web address]

## 2021-05-23 NOTE — Progress Notes (Signed)
[facility] PROGRESS NOTE    Progress Note  Ernest [name]  WUJ:[id number] DOB: 03-Oct-1985 DOA: 05/22/2021 PCP: Patient, No Pcp Per (Inactive)     Brief Narrative:   Ernest [name] is an 35 y.o. male Nepalese speaking with past medical history of compartment syndrome in the right arm obesity comes in for left-sided weakness last known to be normal 930 the day prior to admission woke up unable to use his left side facial droop and slurred speech.  CT of the head showed no acute abnormalities, CTA of head and neck did not show any large vessel occlusion neurology was consulted who deemed him not a candidate for tPA.  I of the brain showed a 3.4 cm acute infarct of the right basal ganglia and right corona radiata there is significant hyperintensity in T2 flair in within the bilateral cerebral white matter overall mild but advanced for his age.  MRA of the head showed no large vessel occlusion    Assessment/Plan:   CVA (cerebral vascular accident) (HCC) HgbA1c pending, fasting lipid panel LDL 120 HDL greater than 40 MRI of the brain showed 3.4 cm infarct in the right basal ganglia and corona radiata concern about chronic small vessel ischemic changes., MRA of the brain without contrast showed no large vessel occlusion. CTA of the head and neck showed a M1 segment stenosis appears to be severe. PT, OT, Speech consult pending Transthoracic Echo, pending Start patient on ASA  daily and plavix  daily  Atorvastatin 80  BP goal: permissive HTN upto 220/120 mmHg Telemetry monitoring Appreciate neurology's assistance. Patient has multiple risk factors including morbid obesity hyperlipidemia and smoker.  Leukocytosis: Chest x-ray showed no acute abnormalities HIV is negative likely reactive due to CVA.  Hypocalcemia: Corrected for his albumin is normal.  Elevated LFTs: Acute hepatitis panel negative Tylenol level undetected. Likely fatty liver.  Dyslipidemia: Started on  statins.  Morbid obesity: Estimated body mass index is 55.45 kg/m as calculated from the following:   Height as of this encounter:  (1.6 m).   Weight as of this encounter: 142 kg.   DVT prophylaxis: lovenox Family Communication:none Status is: Inpatient  Remains inpatient appropriate because: Acute CVA with Acute severity of illness        Code Status:     Code Status Orders  (From admission, onward)           Start     Ordered   05/22/21 0826  Full code  Continuous        05/22/21 0829           Code Status History     This patient has a current code status but no historical code status.         IV Access:   Peripheral IV   Procedures and diagnostic studies:   MR ANGIO HEAD WO CONTRAST  Result Date: 05/22/2021 CLINICAL DATA:  Stroke, follow-up. Additional history provided: Left arm weakness and transient shaking, possible left facial droop. EXAM: MRI HEAD WITHOUT AND WITH CONTRAST MRA HEAD WITHOUT CONTRAST TECHNIQUE: Multiplanar, multi-echo pulse sequences of the brain and surrounding structures were acquired without and with intravenous contrast. Angiographic images of the Circle of Willis were acquired using MRA technique without intravenous contrast. CONTRAST:  10mL GADAVIST GADOBUTROL 1 MMOL/ML IV SOLN COMPARISON:  Noncontrast head CT and CT angiogram head/neck 05/22/2021. FINDINGS: MRI HEAD FINDINGS Brain: Cerebral volume is normal. Acute infarct measuring 3.4 cm in greatest dimension within the right caudate and lentiform nuclei  and right corona radiata. Background multifocal T2 FLAIR hyperintense signal abnormality within the bilateral cerebral white matter, overall mild but significantly advanced for age. The hippocampi are symmetric in size and signal. No evidence of an intracranial mass. No chronic intracranial blood products. No extra-axial fluid collection. No midline shift. No pathologic intracranial enhancement identified. Vascular:  Reported below. Skull and upper cervical spine: No focal suspicious marrow lesion. Sinuses/Orbits: Rightward gaze. Complete T2 hyperintense opacification of the right frontal sinus. Extensive T2 hyperintense opacification of the left frontal sinus. Scattered mucosal thickening and fluid within the left greater than right ethmoid air cells. Frothy secretions and mild mucosal thickening within the right sphenoid sinus. Mild mucosal thickening within the left sphenoid sinus. Extensive partial opacification of the bilateral maxillary sinuses secondary to the presence mucosal thickening and large fluid levels. Other: Fluid within the right middle ear/mastoid air cells. Trace fluid also present within the left mastoid air cells. MRA HEAD FINDINGS Anterior circulation: The intracranial internal carotid arteries are patent. The M1 middle cerebral arteries are patent. Redemonstrated moderate stenosis within the distal M1 right middle cerebral artery. No M2 proximal branch occlusion or high-grade proximal stenosis is identified. The anterior cerebral arteries are patent. No intracranial aneurysm is identified. Posterior circulation: The intracranial vertebral arteries are patent. Dominant right vertebral artery. The basilar artery is patent. The posterior cerebral arteries are patent. A sizable left posterior communicating artery is present. The right posterior communicating artery is diminutive or absent. Anatomic variants: As described. IMPRESSION: MRI brain: 1. 3.4 cm acute infarct within the right basal ganglia and right corona radiata. 2. Background multifocal T2 FLAIR hyperintense signal abnormality within the bilateral cerebral white matter, overall mild but significantly advanced for age. These signal changes are nonspecific and differential considerations include age-advanced chronic small vessel ischemic disease, sequela of chronic migraine headaches, sequela of a prior infectious/inflammatory process or less likely  sequela of a demyelinating process (such as multiple sclerosis), among others. 3. Extensive paranasal sinus disease, as described. Correlate for acute sinusitis. 4. Right greater than left middle ear/mastoid effusions. MRA head: 1. No intracranial large vessel occlusion is identified. 2. Redemonstrated moderate stenosis within the distal M1 segment of the right middle cerebral artery. Electronically Signed   By: Jackey Loge D.O.   On: 05/22/2021 13:32   MR BRAIN W WO CONTRAST  Result Date: 05/22/2021 CLINICAL DATA:  Stroke, follow-up. Additional history provided: Left arm weakness and transient shaking, possible left facial droop. EXAM: MRI HEAD WITHOUT AND WITH CONTRAST MRA HEAD WITHOUT CONTRAST TECHNIQUE: Multiplanar, multi-echo pulse sequences of the brain and surrounding structures were acquired without and with intravenous contrast. Angiographic images of the Circle of Willis were acquired using MRA technique without intravenous contrast. CONTRAST:  53mL GADAVIST GADOBUTROL 1 MMOL/ML IV SOLN COMPARISON:  Noncontrast head CT and CT angiogram head/neck 05/22/2021. FINDINGS: MRI HEAD FINDINGS Brain: Cerebral volume is normal. Acute infarct measuring 3.4 cm in greatest dimension within the right caudate and lentiform nuclei and right corona radiata. Background multifocal T2 FLAIR hyperintense signal abnormality within the bilateral cerebral white matter, overall mild but significantly advanced for age. The hippocampi are symmetric in size and signal. No evidence of an intracranial mass. No chronic intracranial blood products. No extra-axial fluid collection. No midline shift. No pathologic intracranial enhancement identified. Vascular: Reported below. Skull and upper cervical spine: No focal suspicious marrow lesion. Sinuses/Orbits: Rightward gaze. Complete T2 hyperintense opacification of the right frontal sinus. Extensive T2 hyperintense opacification of the left frontal sinus. Scattered mucosal  thickening  and fluid within the left greater than right ethmoid air cells. Frothy secretions and mild mucosal thickening within the right sphenoid sinus. Mild mucosal thickening within the left sphenoid sinus. Extensive partial opacification of the bilateral maxillary sinuses secondary to the presence mucosal thickening and large fluid levels. Other: Fluid within the right middle ear/mastoid air cells. Trace fluid also present within the left mastoid air cells. MRA HEAD FINDINGS Anterior circulation: The intracranial internal carotid arteries are patent. The M1 middle cerebral arteries are patent. Redemonstrated moderate stenosis within the distal M1 right middle cerebral artery. No M2 proximal branch occlusion or high-grade proximal stenosis is identified. The anterior cerebral arteries are patent. No intracranial aneurysm is identified. Posterior circulation: The intracranial vertebral arteries are patent. Dominant right vertebral artery. The basilar artery is patent. The posterior cerebral arteries are patent. A sizable left posterior communicating artery is present. The right posterior communicating artery is diminutive or absent. Anatomic variants: As described. IMPRESSION: MRI brain: 1. 3.4 cm acute infarct within the right basal ganglia and right corona radiata. 2. Background multifocal T2 FLAIR hyperintense signal abnormality within the bilateral cerebral white matter, overall mild but significantly advanced for age. These signal changes are nonspecific and differential considerations include age-advanced chronic small vessel ischemic disease, sequela of chronic migraine headaches, sequela of a prior infectious/inflammatory process or less likely sequela of a demyelinating process (such as multiple sclerosis), among others. 3. Extensive paranasal sinus disease, as described. Correlate for acute sinusitis. 4. Right greater than left middle ear/mastoid effusions. MRA head: 1. No intracranial large vessel occlusion is  identified. 2. Redemonstrated moderate stenosis within the distal M1 segment of the right middle cerebral artery. Electronically Signed   By: Jackey Loge D.O.   On: 05/22/2021 13:32   DG CHEST PORT 1 VIEW  Result Date: 05/22/2021 CLINICAL DATA:  TIA, weakness EXAM: PORTABLE CHEST 1 VIEW COMPARISON:  None. FINDINGS: The cardiomediastinal silhouette is normal. There is no focal consolidation or pulmonary edema. There is no pleural effusion or pneumothorax. There is no acute osseous abnormality. IMPRESSION: No radiographic evidence of acute cardiopulmonary process. Electronically Signed   By: Lesia Hausen M.D.   On: 05/22/2021 08:48   EEG adult  Result Date: 05/22/2021 Charlsie Quest, MD     05/22/2021 10:35 AM Patient Name: Douglass Dunshee MRN: [id number] Epilepsy Attending: Charlsie Quest Referring Physician/Provider: Dr Marisue Humble Date: 05/22/2021 Duration: 23.26 mins Patient history:  35 y.o. male with transient left weakness and slurred speech. EEG to evaluate for seizure. Level of alertness: Awake, asleep AEDs during EEG study: None Technical aspects: This EEG study was done with scalp electrodes positioned according to the 10-20 International system of electrode placement. Electrical activity was acquired at a sampling rate of 500Hz  and reviewed with a high frequency filter of 70Hz  and a low frequency filter of 1Hz . EEG data were recorded continuously and digitally stored. Description: The posterior dominant rhythm consists of 10 Hz activity of moderate voltage (25-35 uV) seen predominantly in posterior head regions, symmetric and reactive to eye opening and eye closing. Sleep was characterized by vertex waves,  maximal frontocentral region. Hyperventilation and photic stimulation were not performed.   IMPRESSION: This study is within normal limits. No seizures or epileptiform discharges were seen throughout the recording.   CT HEAD CODE STROKE WO CONTRAST  Result Date:  05/22/2021 CLINICAL DATA:  Code stroke.  Left-sided weakness EXAM: CT HEAD WITHOUT CONTRAST TECHNIQUE: Contiguous axial images were obtained from the  base of the skull through the vertex without intravenous contrast. COMPARISON:  None. FINDINGS: Brain: No evidence of acute infarction, hemorrhage, cerebral edema, mass, mass effect, or midline shift. Ventricles and sulci are normal for age. No extra-axial fluid collection. Vascular: No hyperdense vessel or unexpected calcification. Skull: Normal. Negative for fracture or focal lesion. Sinuses/Orbits: No acute finding. Other: The mastoid air cells are well aerated. ASPECTS [facility] Stroke Program Early CT Score) - Ganglionic level infarction (caudate, lentiform nuclei, internal capsule, insula, M1-M3 cortex): 7 - Supraganglionic infarction (M4-M6 cortex): 3 Total score (0-10 with 10 being normal): 10 IMPRESSION: 1. No acute intracranial process. 2. ASPECTS is 10 Code stroke imaging results were communicated on 05/22/2021 at 2:50 am to provider [facility] via secure text paging. Electronically Signed   By: Wiliam Ke M.D.   On: 05/22/2021 02:51   CT ANGIO HEAD CODE STROKE  Addendum Date: 05/22/2021   ADDENDUM REPORT: 05/22/2021 09:19 ADDENDUM: Study discussed by telephone with Dr. Roda Shutters at 9:16 am on 05/22/2021. He questions abnormality of the distal Right MCA M1 segment on this exam, and on review there is moderate to severe irregularity and stenosis of the distal Right M1. See series 10, image 21 and series 9, image 78. That MCA bifurcation does remain patent, and Right MCA M2 branches appear within normal limits. No similar vessel irregularity or stenosis identified. No discrete atherosclerosis elsewhere in the head or neck. Unclear whether the abnormality of the Right MCA represents acute thromboembolic disease versus vaso spasm or other. The patient's NIH score is now approaching zero, and Dr. Roda Shutters and I discussed follow-up Brain MRI with intracranial MRA to  re-evaluate. Electronically Signed   By: Odessa Fleming M.D.   On: 05/22/2021 09:19   Result Date: 05/22/2021 CLINICAL DATA:  Left arm weakness and transient shaking, possible left facial droop EXAM: CT ANGIOGRAPHY HEAD AND NECK TECHNIQUE: Multidetector CT imaging of the head and neck was performed using the standard protocol during bolus administration of intravenous contrast. Multiplanar CT image reconstructions and MIPs were obtained to evaluate the vascular anatomy. Carotid stenosis measurements (when applicable) are obtained utilizing NASCET criteria, using the distal internal carotid diameter as the denominator. CONTRAST:  80mL OMNIPAQUE IOHEXOL 350 MG/ML SOLN COMPARISON:  None. FINDINGS: CT HEAD FINDINGS Please see same-day CT stroke code CTA NECK FINDINGS Aortic arch: 4 vessel arch, with the origin of the left vertebral artery originating from the arch imaged portion shows no evidence of aneurysm or dissection. No significant stenosis of the major arch vessel origins. Right carotid system: No evidence of dissection, stenosis (50% or greater) or occlusion. Left carotid system: No evidence of dissection, stenosis (50% or greater) or occlusion. Retropharyngeal course of the left CCA and ICA. Vertebral arteries: No evidence of dissection, stenosis (50% or greater) or occlusion. Skeleton: No acute osseous abnormality. Other neck: Negative Upper chest: Negative Review of the MIP images confirms the above findings CTA HEAD FINDINGS Anterior circulation: Both internal carotid arteries are patent to the termini, without stenosis or other abnormality. A1 segments patent, with possible stenosis at the origin of the left A1. Normal anterior communicating artery. Anterior cerebral arteries are patent to their distal aspects. No M1 stenosis or occlusion. Normal MCA bifurcations. Distal MCA branches perfused and symmetric. Posterior circulation: Vertebral arteries widely patent to the vertebrobasilar junction without stenosis,  with the majority of the left vertebral artery supplying the left PICA. Posterior inferior cerebral arteries patent bilaterally. Basilar patent to its distal aspect. Superior cerebral arteries patent bilaterally.  Fetal origin left PCA. PCAs well perfused to their distal aspects without stenosis. The right posterior communicating artery is not definitively visualized. Venous sinuses: As permitted by contrast timing, patent. Anatomic variants: None significant Review of the MIP images confirms the above findings IMPRESSION: 1. No hemodynamically significant stenosis in the neck. 2. No intracranial large vessel occlusion. Electronically Signed: By: Wiliam Ke M.D. On: 05/22/2021 03:16   CT ANGIO NECK CODE STROKE  Addendum Date: 05/22/2021   ADDENDUM REPORT: 05/22/2021 09:19 ADDENDUM: Study discussed by telephone with Dr. Roda Shutters at 9:16 am on 05/22/2021. He questions abnormality of the distal Right MCA M1 segment on this exam, and on review there is moderate to severe irregularity and stenosis of the distal Right M1. See series 10, image 21 and series 9, image 78. That MCA bifurcation does remain patent, and Right MCA M2 branches appear within normal limits. No similar vessel irregularity or stenosis identified. No discrete atherosclerosis elsewhere in the head or neck. Unclear whether the abnormality of the Right MCA represents acute thromboembolic disease versus vaso spasm or other. The patient's NIH score is now approaching zero, and Dr. Roda Shutters and I discussed follow-up Brain MRI with intracranial MRA to re-evaluate. Electronically Signed   By: Odessa Fleming M.D.   On: 05/22/2021 09:19   Result Date: 05/22/2021 CLINICAL DATA:  Left arm weakness and transient shaking, possible left facial droop EXAM: CT ANGIOGRAPHY HEAD AND NECK TECHNIQUE: Multidetector CT imaging of the head and neck was performed using the standard protocol during bolus administration of intravenous contrast. Multiplanar CT image reconstructions and  MIPs were obtained to evaluate the vascular anatomy. Carotid stenosis measurements (when applicable) are obtained utilizing NASCET criteria, using the distal internal carotid diameter as the denominator. CONTRAST:  62mL OMNIPAQUE IOHEXOL 350 MG/ML SOLN COMPARISON:  None. FINDINGS: CT HEAD FINDINGS Please see same-day CT stroke code CTA NECK FINDINGS Aortic arch: 4 vessel arch, with the origin of the left vertebral artery originating from the arch imaged portion shows no evidence of aneurysm or dissection. No significant stenosis of the major arch vessel origins. Right carotid system: No evidence of dissection, stenosis (50% or greater) or occlusion. Left carotid system: No evidence of dissection, stenosis (50% or greater) or occlusion. Retropharyngeal course of the left CCA and ICA. Vertebral arteries: No evidence of dissection, stenosis (50% or greater) or occlusion. Skeleton: No acute osseous abnormality. Other neck: Negative Upper chest: Negative Review of the MIP images confirms the above findings CTA HEAD FINDINGS Anterior circulation: Both internal carotid arteries are patent to the termini, without stenosis or other abnormality. A1 segments patent, with possible stenosis at the origin of the left A1. Normal anterior communicating artery. Anterior cerebral arteries are patent to their distal aspects. No M1 stenosis or occlusion. Normal MCA bifurcations. Distal MCA branches perfused and symmetric. Posterior circulation: Vertebral arteries widely patent to the vertebrobasilar junction without stenosis, with the majority of the left vertebral artery supplying the left PICA. Posterior inferior cerebral arteries patent bilaterally. Basilar patent to its distal aspect. Superior cerebral arteries patent bilaterally. Fetal origin left PCA. PCAs well perfused to their distal aspects without stenosis. The right posterior communicating artery is not definitively visualized. Venous sinuses: As permitted by contrast  timing, patent. Anatomic variants: None significant Review of the MIP images confirms the above findings IMPRESSION: 1. No hemodynamically significant stenosis in the neck. 2. No intracranial large vessel occlusion. Electronically Signed: By: Wiliam Ke M.D. On: 05/22/2021 03:16     Medical Consultants:  None.   Subjective:    Hiep Ollis no complaints still weak.  Objective:    Vitals:   05/22/21 1758 05/22/21 2233 05/22/21 2326 05/23/21 0431  BP: (!) 119/106 133/84 119/81 133/89  Pulse: 77 71 85 79  Resp: Temp: 98.3 F (36.8 C) 97.8 F (36.6 C) 97.6 F (36.4 C) 98 F (36.7 C)  TempSrc: Oral Oral  Oral  SpO2: 99% 99% 99% 97%  Weight:      Height:       SpO2: 97 %   Intake/Output Summary (Last 24 hours) at 05/23/2021 0732 Last data filed at 05/22/2021 1030 Gross per 24 hour  Intake 0.5 ml  Output --  Net 0.5 ml   Filed Weights   05/22/21 1708  Weight: (!) 142 kg    Exam: General exam: In no acute distress. Respiratory system: Good air movement and clear to auscultation. Cardiovascular system: S1 & S2 heard, RRR. No JVD. Gastrointestinal system: Abdomen is nondistended, soft and nontender.  Extremities: No pedal edema. Skin: No rashes, lesions or ulcers Psychiatry: Judgement and insight appear normal. Mood & affect appropriate.    Data Reviewed:    Labs: Basic Metabolic Panel: Recent Labs  Lab 05/22/21 0240 05/22/21 0246 05/23/21 0150  NA 135 139 136  K 3.8 3.7 3.7  CL 104 104 105  CO2 23  --  25  GLUCOSE 109* 107* 93  BUN CREATININE 0.98 0.90 1.00  CALCIUM 8.7*  --  8.6*   GFR Estimated Creatinine Clearance: 133.8 mL/min (by C-G formula based on SCr of 1 mg/dL). Liver Function Tests: Recent Labs  Lab 05/22/21 0240 05/23/21 0150  AST 53* 45*  ALT 73* 69*  ALKPHOS 92 81  BILITOT 0.7 1.2  PROT 7.3 6.7  ALBUMIN 3.4* 3.2*   No results for input(s): LIPASE, AMYLASE in the last 168 hours. No results for  input(s): AMMONIA in the last 168 hours. Coagulation profile Recent Labs  Lab 05/22/21 0240  INR 1.0   COVID-19 Labs  Recent Labs    05/22/21 0845  CRP 0.8    Lab Results  Component Value Date   SARSCOV2NAA NEGATIVE 05/22/2021    CBC: Recent Labs  Lab 05/22/21 0240 05/22/21 0246 05/23/21 0150  WBC 11.7*  --  11.1*  NEUTROABS 5.7  --  5.5  HGB 14.7 15.3 14.1  HCT 43.8 45.0 41.9  MCV 85.5  --  85.9  PLT 289  --  287   Cardiac Enzymes: No results for input(s): CKTOTAL, CKMB, CKMBINDEX, TROPONINI in the last 168 hours. BNP (last 3 results) No results for input(s): PROBNP in the last 8760 hours. CBG: Recent Labs  Lab 05/22/21 0305  GLUCAP 108*   D-Dimer: No results for input(s): DDIMER in the last 72 hours. Hgb A1c: No results for input(s): HGBA1C in the last 72 hours. Lipid Profile: Recent Labs    05/22/21 0845  CHOL 186  HDL 48  LDLCALC 120*  TRIG 88  CHOLHDL 3.9   Thyroid function studies: Recent Labs    05/22/21 0845  TSH 0.687   Anemia work up: No results for input(s): VITAMINB12, FOLATE, FERRITIN, TIBC, IRON, RETICCTPCT in the last 72 hours. Sepsis Labs: Recent Labs  Lab 05/22/21 0240 05/23/21 0150  WBC 11.7* 11.1*   Microbiology Recent Results (from the past 240 hour(s))  Resp Panel by RT-PCR (Flu A&B, Covid) Nasopharyngeal Swab     Status: None   Collection Time: 05/22/21  2:38 AM   Specimen: Nasopharyngeal Swab; Nasopharyngeal(NP) swabs in vial transport medium  Result Value Ref Range Status   SARS Coronavirus 2 by RT PCR NEGATIVE NEGATIVE Final    Comment: (NOTE) SARS-CoV-2 target nucleic acids are NOT DETECTED.  The SARS-CoV-2 RNA is generally detectable in upper respiratory specimens during the acute phase of infection. The lowest concentration of SARS-CoV-2 viral copies this assay can detect is 138 copies/mL. A negative result does not preclude SARS-Cov-2 infection and should not be used as the sole basis for treatment  or other patient management decisions. A negative result may occur with  improper specimen collection/handling, submission of specimen other than nasopharyngeal swab, presence of viral mutation(s) within the areas targeted by this assay, and inadequate number of viral copies(<138 copies/mL). A negative result must be combined with clinical observations, patient history, and epidemiological information. The expected result is Negative.  Fact Sheet for Patients:  BloggerCourse.com  Fact Sheet for Healthcare Providers:  SeriousBroker.it  This test is no t yet approved or cleared by the [place] FDA and  has been authorized for detection and/or diagnosis of SARS-CoV-2 by FDA under an Emergency Use Authorization (EUA). This EUA will remain  in effect (meaning this test can be used) for the duration of the COVID-19 declaration under Section 564(b)(1) of the Act, 21 U.S.C.section 360bbb-3(b)(1), unless the authorization is terminated  or revoked sooner.       Influenza A by PCR NEGATIVE NEGATIVE Final   Influenza B by PCR NEGATIVE NEGATIVE Final    Comment: (NOTE) The Xpert Xpress SARS-CoV-2/FLU/RSV plus assay is intended as an aid in the diagnosis of influenza from Nasopharyngeal swab specimens and should not be used as a sole basis for treatment. Nasal washings and aspirates are unacceptable for Xpert Xpress SARS-CoV-2/FLU/RSV testing.  Fact Sheet for Patients: BloggerCourse.com  Fact Sheet for Healthcare Providers: SeriousBroker.it  This test is not yet approved or cleared by the [place] FDA and has been authorized for detection and/or diagnosis of SARS-CoV-2 by FDA under an Emergency Use Authorization (EUA). This EUA will remain in effect (meaning this test can be used) for the duration of the COVID-19 declaration under Section 564(b)(1) of the Act, 21 U.S.C. section  360bbb-3(b)(1), unless the authorization is terminated or revoked.  Performed at [facility], [address], [place], [place] [address]      Medications:     stroke: mapping our early stages of recovery book   Does not apply Once   aspirin EC  325 mg Oral Daily   atorvastatin  80 mg Oral Daily   clopidogrel  75 mg Oral Daily   enoxaparin (LOVENOX) injection  40 mg Subcutaneous Q24H   nicotine  7 mg Transdermal Daily   Continuous Infusions:    LOS: 1 day   Marinda Elk  [facility]  05/23/2021, 7:32 AM

## 2021-05-23 NOTE — Evaluation (Signed)
Occupational Therapy Evaluation Patient Details Name: Ernest [name] MRN: [id number] DOB: 10-21-1985 Today's Date: 05/23/2021   History of Present Illness Patient is a 35 y/o male who presents on 05/22/21 with left sided weakness and slurred speech. 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 PMH includes compartment syndrome RUE, obesity, smoker.   Clinical Impression   Pt admitted for concerns listed above. PTA pt reported that he was independent with all ADL's and IADL's, including working as a Production designer, theatre/television/film. At this time, pt reports that he is back to his baseline. He is ambulating and transferring with no difficulties and able to complete ADL's safely. Additionally vision and coordination appear to be [facility]. Pt has no further OT needs and acute OT will sign off.       Recommendations for follow up therapy are one component of a multi-disciplinary discharge planning process, led by the attending physician.  Recommendations may be updated based on patient status, additional functional criteria and insurance authorization.   Follow Up Recommendations  No OT follow up    Equipment Recommendations  None recommended by OT    Recommendations for Other Services       Precautions / Restrictions Precautions Precautions: None Restrictions Weight Bearing Restrictions: No      Mobility Bed Mobility               General bed mobility comments: Up in chair upon PT arrival.    Transfers Overall transfer level: Independent Equipment used: None             General transfer comment: Stood from chair without difficulty.    Balance Overall balance assessment: No apparent balance deficits (not formally assessed)                                         ADL either performed or assessed with clinical judgement   ADL Overall ADL's : Independent;At baseline                                        General ADL Comments: Pt with no apparent difficulties. Good ROM and strength to complete ADL's, as well as good balance in standingwith wide base.     Vision Baseline Vision/History: 0 No visual deficits Ability to See in Adequate Light: 0 Adequate Patient Visual Report: No change from baseline Vision Assessment?: No apparent visual deficits     Perception Perception Perception Tested?: No   Praxis Praxis Praxis tested?: Within functional limits    Pertinent Vitals/Pain Pain Assessment: No/denies pain     Hand Dominance Right   Extremity/Trunk Assessment Upper Extremity Assessment Upper Extremity Assessment: Overall WFL for tasks assessed   Lower Extremity Assessment Lower Extremity Assessment: Defer to PT evaluation   Cervical / Trunk Assessment Cervical / Trunk Assessment: Normal   Communication Communication Communication: No difficulties   Cognition Arousal/Alertness: Awake/alert Behavior During Therapy: WFL for tasks assessed/performed Overall Cognitive Status: Within Functional Limits for tasks assessed                                 General Comments: interpreter not available at time of eval but pt reports we can continue in english; seems  approrpiate for basic tasks. somewhat flat, seems sad.   General Comments       Exercises     Shoulder Instructions      Home Living Family/patient expects to be discharged to:: Private residence Living Arrangements: Spouse/significant other Available Help at Discharge: Family;Available PRN/intermittently Type of Home: House Home Access: Stairs to enter Secretary/administrator of Steps: 13flight   Home Layout: One level     Bathroom Shower/Tub: Producer, television/film/video: Standard     Home Equipment: None          Prior Functioning/Environment Level of Independence: Independent        Comments: Drives, works as a Web designer Problem List: Decreased  strength;Decreased activity tolerance      OT Treatment/Interventions:      OT Goals(Current goals can be found in the care plan section) Acute Rehab OT Goals Patient Stated Goal: return to PLOF OT Goal Formulation: With patient Time For Goal Achievement: 05/23/21 Potential to Achieve Goals: Good  OT Frequency:     Barriers to D/C:            Co-evaluation              AM-PAC OT "6 Clicks" Daily Activity     Outcome Measure Help from another person eating meals?: None Help from another person taking care of personal grooming?: None Help from another person toileting, which includes using toliet, bedpan, or urinal?: None Help from another person bathing (including washing, rinsing, drying)?: None Help from another person to put on and taking off regular upper body clothing?: None Help from another person to put on and taking off regular lower body clothing?: None 6 Click Score: 24   End of Session Nurse Communication: Mobility status  Activity Tolerance: Patient tolerated treatment well Patient left: in chair;with call bell/phone within reach;with family/visitor present  OT Visit Diagnosis: Unsteadiness on feet (R26.81);Muscle weakness (generalized) (M62.81)                Time: 3557-3220 OT Time Calculation (min): 10 min Charges:  OT General Charges $OT Visit: 1 Visit OT Evaluation $OT Eval Low Complexity: 1 Low  Rosaleigh Brazzel H., OTR/L Acute Rehabilitation  [place] Elane Bing Plume 05/23/2021, 12:41 PM

## 2021-05-23 NOTE — TOC Transition Note (Signed)
Transition of Care [facility]) - CM/SW Discharge Note   Patient Details  Name: Hamlin Devine MRN: [id number] Date of Birth: 1986/01/17  Transition of Care [facility]) CM/SW Contact:  Kermit Balo, RN Phone Number: 05/23/2021, 10:06 AM   Clinical Narrative:    Patient is discharging home with self care. No f/u per PT and no DME needs.  Medications for home to be delivered to the room per [facility] pharmacy.  PCP appt scheduled with information on the AVS. Pt has transportation home.    Final next level of care: Home/Self Care Barriers to Discharge: No Barriers Identified   Patient Goals and CMS Choice        Discharge Placement                       Discharge Plan and Services                                     Social Determinants of Health (SDOH) Interventions     Readmission Risk Interventions No flowsheet data found.

## 2021-05-23 NOTE — Evaluation (Signed)
Physical Therapy Evaluation Patient Details Name: Ernest [name] MRN: [id number] DOB: 04-Jun-1986 Today's Date: 05/23/2021  History of Present Illness  Patient is a 35 y/o male who presents on 05/22/21 with left sided weakness and slurred speech. 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 PMH includes compartment syndrome RUE, obesity, smoker.  Clinical Impression  Patient tolerated transfers, gait training and stair training Mod I without difficulty. Reports feeling back to baseline with no deficits at this time. Noted to have slow gait with wide BoS but no evidence of imbalance. Education re: BeFAST. Pt also appeared to have flat affect and with saddened mood. Pt has support of wife at home. Does not require further skilled therapy services as pt report functioning at baseline. All education completed. Discharge from therapy.       Recommendations for follow up therapy are one component of a multi-disciplinary discharge planning process, led by the attending physician.  Recommendations may be updated based on patient status, additional functional criteria and insurance authorization.  Follow Up Recommendations No PT follow up    Equipment Recommendations  None recommended by PT    Recommendations for Other Services       Precautions / Restrictions Precautions Precautions: None Restrictions Weight Bearing Restrictions: No      Mobility  Bed Mobility               General bed mobility comments: Up in chair upon PT arrival.    Transfers Overall transfer level: Independent               General transfer comment: Stood from chair without difficulty.  Ambulation/Gait Ambulation/Gait assistance: Modified independent (Device/Increase time) Gait Distance (Feet): 200 Feet Assistive device: None Gait Pattern/deviations: Wide base of support;Decreased stride length;Step-through pattern   Gait velocity  interpretation: 1.31 - 2.62 ft/sec, indicative of limited community ambulator General Gait Details: Slow, gait with wide BoS, decreased foot clearance bilaterally but likely baseline. No evidence of imbalance.  Stairs Stairs: Yes Stairs assistance: Modified independent (Device/Increase time) Stair Management: One rail Right Number of Stairs: 13 General stair comments: Cues for safety. Mild SOB noted. HR up to 109 bpm.  Wheelchair Mobility    Modified Rankin (Stroke Patients Only) Modified Rankin (Stroke Patients Only) Pre-Morbid Rankin Score: No symptoms Modified Rankin: No symptoms     Balance Overall balance assessment: No apparent balance deficits (not formally assessed)                                           Pertinent Vitals/Pain Pain Assessment: No/denies pain    Home Living Family/patient expects to be discharged to:: Private residence Living Arrangements: Spouse/significant other Available Help at Discharge: Family;Available PRN/intermittently Type of Home: House Home Access: Stairs to enter   Secretary/administrator of Steps: 46flight Home Layout: One level Home Equipment: None      Prior Function Level of Independence: Independent         Comments: Drives, independent     Hand Dominance   Dominant Hand: Right    Extremity/Trunk Assessment   Upper Extremity Assessment Upper Extremity Assessment: Defer to OT evaluation    Lower Extremity Assessment Lower Extremity Assessment: Overall WFL for tasks assessed    Cervical / Trunk Assessment Cervical / Trunk Assessment: Normal  Communication   Communication: No difficulties  Cognition Arousal/Alertness: Awake/alert Behavior  During Therapy: Flat affect Overall Cognitive Status: Within Functional Limits for tasks assessed                                 General Comments: interpreter not available at time of eval but pt reports we can continue in english; seems  approrpiate for basic tasks. somewhat flat, seems sad.      General Comments      Exercises     Assessment/Plan    PT Assessment Patent does not need any further PT services  PT Problem List         PT Treatment Interventions      PT Goals (Current goals can be found in the Care Plan section)  Acute Rehab PT Goals Patient Stated Goal: return to PLOF PT Goal Formulation: All assessment and education complete, DC therapy    Frequency     Barriers to discharge        Co-evaluation               AM-PAC PT "6 Clicks" Mobility  Outcome Measure Help needed turning from your back to your side while in a flat bed without using bedrails?: None Help needed moving from lying on your back to sitting on the side of a flat bed without using bedrails?: None Help needed moving to and from a bed to a chair (including a wheelchair)?: None Help needed standing up from a chair using your arms (e.g., wheelchair or bedside chair)?: None Help needed to walk in hospital room?: None Help needed climbing 3-5 steps with a railing? : A Little 6 Click Score: 23    End of Session   Activity Tolerance: Patient tolerated treatment well Patient left: in bed;Other (comment);with nursing/sitter in room (nurse and transport to take him down for echo) Nurse Communication: Mobility status PT Visit Diagnosis: Difficulty in walking, not elsewhere classified (R26.2)    Time: 9563-8756 PT Time Calculation (min) (ACUTE ONLY): 15 min   Charges:   PT Evaluation $PT Eval Moderate Complexity: 1 Mod          Vale Haven, PT, DPT Acute Rehabilitation Services Pager [phone] Office [phone]     Blake Divine A Lanier Ensign 05/23/2021, 9:33 AM

## 2021-05-23 NOTE — Plan of Care (Signed)
  Problem: Education: Goal: Knowledge of General Education information will improve Description: Including pain rating scale, medication(s)/side effects and non-pharmacologic comfort measures Outcome: Adequate for Discharge   Problem: Health Behavior/Discharge Planning: Goal: Ability to manage health-related needs will improve Outcome: Adequate for Discharge   Problem: Clinical Measurements: Goal: Ability to maintain clinical measurements within normal limits will improve Outcome: Adequate for Discharge Goal: Will remain free from infection Outcome: Adequate for Discharge Goal: Diagnostic test results will improve Outcome: Adequate for Discharge Goal: Respiratory complications will improve Outcome: Adequate for Discharge Goal: Cardiovascular complication will be avoided Outcome: Adequate for Discharge   Problem: Activity: Goal: Risk for activity intolerance will decrease Outcome: Adequate for Discharge   Problem: Nutrition: Goal: Adequate nutrition will be maintained Outcome: Adequate for Discharge   Problem: Coping: Goal: Level of anxiety will decrease Outcome: Adequate for Discharge   Problem: Elimination: Goal: Will not experience complications related to bowel motility Outcome: Adequate for Discharge Goal: Will not experience complications related to urinary retention Outcome: Adequate for Discharge   Problem: Pain Managment: Goal: General experience of comfort will improve Outcome: Adequate for Discharge   Problem: Safety: Goal: Ability to remain free from injury will improve Outcome: Adequate for Discharge   Problem: Skin Integrity: Goal: Risk for impaired skin integrity will decrease Outcome: Adequate for Discharge   Problem: Education: Goal: Knowledge of secondary prevention will improve Outcome: Adequate for Discharge   

## 2021-05-23 NOTE — Progress Notes (Signed)
STROKE TEAM PROGRESS NOTE   ATTENDING NOTE:  Wife and RN are at the bed, pt walking in the room, will collect the urine sample. His left facial droop is almost resolved. Walking without difficulty. 2D echo EF 60-65%.  A1c 5.5, UDS pending.  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  Patient has multiple risk factors including morbid obesity, smoker, hyperlipidemia.  Patient snoring at night per wife, high likelihood for OSA with morbid obesity, will refer to outpatient sleep study. However, RCVS from "monster" drinks is still in DDx. Recommend to quit "Monster" drinks and will repeat MRA in 2-3 months at neurology follow up.  Recommend aggressive stroke risk factor modification, quit smoking.  Continue aspirin 325 and Plavix 75 daily DAPT for 3 months due to intracranial stenosis, and then aspirin alone.  Continue high intensity Lipitor 80.  PT/OT no recon.    For detailed assessment and plan, please refer to above as I have made changes wherever appropriate.   Neurology will sign off. Please call with questions. Pt will follow up with stroke clinic NP at [facility] in about 4 weeks. Thanks for the consult.   Marvel Plan, MD PhD Stroke Neurology 05/23/2021 1:42 PM    INTERVAL HISTORY Patient is seen in the ED with his significant other is at the bedside.  Remote interpreter and assistance from family members was used, as patient speaks [place] and limited [place].  Patient is hemodynamically stable.  He appears appropriately worried about his recent symptoms.    Vitals:   05/22/21 2326 05/23/21 0431 05/23/21 0735 05/23/21 1144  BP: 119/81 133/89 (!) 124/91 (!) 135/98  Pulse: 85 79 81 86  Resp: 18 17  18   Temp: 97.6 F (36.4 C) 98 F (36.7 C) 98.1 F (36.7 C) 98.3 F (36.8 C)  TempSrc:  Oral Oral Oral  SpO2: 99% 97% 98% 100%  Weight:      Height:       CBC:  Recent Labs  Lab 05/22/21 0240 05/22/21 0246  05/23/21 0150  WBC 11.7*  --  11.1*  NEUTROABS 5.7  --  5.5  HGB 14.7 15.3 14.1  HCT 43.8 45.0 41.9  MCV 85.5  --  85.9  PLT 289  --  287   Basic Metabolic Panel:  Recent Labs  Lab 05/22/21 0240 05/22/21 0246 05/23/21 0150  NA 135 139 136  K 3.8 3.7 3.7  CL 104 104 105  CO2 23  --  25  GLUCOSE 109* 107* 93  BUN 8 7 9   CREATININE 0.98 0.90 1.00  CALCIUM 8.7*  --  8.6*   Lipid Panel:  Recent Labs  Lab 05/22/21 0845  CHOL 186  TRIG 88  HDL 48  CHOLHDL 3.9  VLDL 18  LDLCALC *   HgbA1c:  Recent Labs  Lab 05/22/21 0845  HGBA1C 5.5   pending Urine Drug Screen: No results for input(s): LABOPIA, COCAINSCRNUR, LABBENZ, AMPHETMU, THCU, LABBARB in the last 168 hours. pending Alcohol Level  Recent Labs  Lab 05/22/21 0240  ETH <10    IMAGING past 24 hours ECHOCARDIOGRAM COMPLETE  Result Date: 05/23/2021    ECHOCARDIOGRAM REPORT   Patient Name:   [facility] Date of Exam: 05/23/2021 Medical Rec #:  [facility]      Height:       63.0 in Accession #:    05/25/2021     Weight:       313.1 lb  Date of Birth:  1985-09-18      BSA:          2.339 m Patient Age:    35 years       BP:           124/91 mmHg Patient Gender: M              HR:           75 bpm. Exam Location:  Inpatient Procedure: 2D Echo Indications:    TIA  History:        Patient has no prior history of Echocardiogram examinations.                 Risk Factors:Sleep Apnea, Current Smoker and Dyslipidemia.  Sonographer:    Delcie Roch RDCS Referring Phys: [phone] RONDELL A SMITH IMPRESSIONS  1. Left ventricular ejection fraction, by estimation, is 60 to 65%. The left ventricle has normal function. The left ventricle has no regional wall motion abnormalities. There is mild left ventricular hypertrophy. Left ventricular diastolic parameters were normal.  2. Right ventricular systolic function is normal. The right ventricular size is normal.  3. The mitral valve is normal in structure. No evidence of mitral valve  regurgitation. No evidence of mitral stenosis.  4. The aortic valve has an indeterminant number of cusps. Aortic valve regurgitation is not visualized. No aortic stenosis is present.  5. The inferior vena cava is normal in size with greater than 50% respiratory variability, suggesting right atrial pressure of 3 mmHg. Comparison(s): No prior Echocardiogram. FINDINGS  Left Ventricle: Left ventricular ejection fraction, by estimation, is 60 to 65%. The left ventricle has normal function. The left ventricle has no regional wall motion abnormalities. The left ventricular internal cavity size was normal in size. There is  mild left ventricular hypertrophy. Left ventricular diastolic parameters were normal. Right Ventricle: The right ventricular size is normal. Right ventricular systolic function is normal. Left Atrium: Left atrial size was normal in size. Right Atrium: Right atrial size was normal in size. Pericardium: There is no evidence of pericardial effusion. Mitral Valve: The mitral valve is normal in structure. No evidence of mitral valve regurgitation. No evidence of mitral valve stenosis. Tricuspid Valve: The tricuspid valve is normal in structure. Tricuspid valve regurgitation is trivial. No evidence of tricuspid stenosis. Aortic Valve: The aortic valve has an indeterminant number of cusps. Aortic valve regurgitation is not visualized. No aortic stenosis is present. Aortic valve mean gradient measures 4.0 mmHg. Aortic valve peak gradient measures 7.5 mmHg. Aortic valve area, by VTI measures 2.51 cm. Pulmonic Valve: The pulmonic valve was not well visualized. Pulmonic valve regurgitation is not visualized. No evidence of pulmonic stenosis. Aorta: The aortic root is normal in size and structure. Venous: The inferior vena cava is normal in size with greater than 50% respiratory variability, suggesting right atrial pressure of 3 mmHg. IAS/Shunts: No atrial level shunt detected by color flow Doppler.  LEFT VENTRICLE  PLAX 2D LVIDd:         4.10 cm   Diastology LVIDs:         3.00 cm   LV e' medial:    8.27 cm/s LV PW:         1.10 cm   LV E/e' medial:  10.6 LV IVS:        1.20 cm   LV e' lateral:   13.70 cm/s LVOT diam:     1.90 cm   LV E/e' lateral: 6.4 LV  SV:         64 LV SV Index:   28 LVOT Area:     2.84 cm  RIGHT VENTRICLE             IVC RV Basal diam:  3.70 cm     IVC diam: 1.60 cm RV S prime:     13.80 cm/s TAPSE (M-mode): 1.5 cm LEFT ATRIUM             Index        RIGHT ATRIUM           Index LA diam:        3.40 cm 1.45 cm/m   RA Area:     22.50 cm LA Vol (A2C):   36.9 ml 15.77 ml/m  RA Volume:   75.90 ml  32.45 ml/m LA Vol (A4C):   44.0 ml 18.81 ml/m LA Biplane Vol: 42.4 ml 18.13 ml/m  AORTIC VALVE AV Area (Vmax):    2.65 cm AV Area (Vmean):   2.46 cm AV Area (VTI):     2.51 cm AV Vmax:           137.00 cm/s AV Vmean:          90.200 cm/s AV VTI:            0.256 m AV Peak Grad:      7.5 mmHg AV Mean Grad:      4.0 mmHg LVOT Vmax:         128.00 cm/s LVOT Vmean:        78.400 cm/s LVOT VTI:          0.227 m LVOT/AV VTI ratio: 0.89  AORTA Ao Root diam: 2.90 cm Ao Asc diam:  2.90 cm MITRAL VALVE MV Area (PHT): 3.50 cm    SHUNTS MV Decel Time: 217 msec    Systemic VTI:  0.23 m MV E velocity: 87.40 cm/s  Systemic Diam: 1.90 cm MV A velocity: 69.40 cm/s MV E/A ratio:  1.26 Olga Millers MD Electronically signed by Olga Millers MD Signature Date/Time: 05/23/2021/10:59:21 AM    Final     PHYSICAL EXAM Patient is a well-nourished, alert male in no acute distress. Right arm with sequelae of prior trauma.   Neurological examination:  NEURO:  Mental Status: AA&Ox3  Speech/Language: speech is without dysarthria or aphasia.    Cranial Nerves:  V: Sensation is intact to light touch and symmetrical to face.  VII: Smile is symmetrical. Able to puff cheeks and raise eyebrows.  VIII: hearing intact to voice. UX:[id number] shrug 5/5. XII: tongue is midline without fasciculations. Motor: 5/5 strength to all  muscle groups tested.  Tone: is normal and bulk is normal Sensation- Intact to light touch bilaterally. Extinction absent to light touch to DSS.   Gait- deferred    ASSESSMENT/PLAN Ernest [name] is a 35 y.o. 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  He presented to the ED last night after awakening around 0130 with left sided weakness, left sided facial droop and dysphasia.  Symptoms resolved spontaneously, but patient and significant other state that they come and go.  CTA performed reveals narrowing of the right distal M1 segment of the MCA.  EEG performed was negative for epileptiform discharges.  Workup to continue with MRI/MRA and echocardiogram.  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 Code  Stroke CT head No acute abnormality. ASPECTS 10.  CTA head & neck narrowing and irregularity of the right distal M1 segment of the MCA MRI brain done shows 3.4 cm acute infarct within the right basal ganglia and right corona radiata. MRA head shows moderate stenosis within the distal M1 right middle cerebral artery. No M2 proximal branch occlusion or high-grade proximal stenosis is identified 2D Echo pending LDL 120 HgbA1c pending VTE prophylaxis - per primary team No antithrombotic prior to admission, now on aspirin 325 and Plavix 75 daily DAPT for 3 months due to intracranial stenosis, and then aspirin alone.  Therapy recommendations: pending Disposition:  pending, likely to home   Hyperlipidemia Home meds:  none LDL 120, goal < 70 Add atorvastatin 80 mg daily High intensity statin started Continue statin at discharge  Tobacco abuse Current smoker Smoking cessation counseling provided Pt is willing to quit  Other Stroke Risk Factors  Current Cigarette smoker, advised to stop smoking Substance abuse - UDS:  pending Obesity, no weight  listed at time of this note.   Highly likely obstructive sleep apnea, will refer to outpatient sleep study  Other Active Problems none  Hospital day # 1   To contact Stroke Continuity provider, please refer to [email or web address]. After hours, contact General Neurology

## 2021-05-24 ENCOUNTER — Emergency Department (HOSPITAL_COMMUNITY): Payer: 59

## 2021-05-24 ENCOUNTER — Inpatient Hospital Stay (HOSPITAL_COMMUNITY)
Admission: EM | Admit: 2021-05-24 | Discharge: 2021-05-28 | DRG: 065 | Disposition: A | Discharge status: Home / Self Care | Payer: 59 | Attending: Internal Medicine | Admitting: Internal Medicine

## 2021-05-24 ENCOUNTER — Other Ambulatory Visit: Payer: Self-pay

## 2021-05-24 DIAGNOSIS — R7401 Elevation of levels of liver transaminase levels: Secondary | ICD-10-CM | POA: Diagnosis present

## 2021-05-24 DIAGNOSIS — R197 Diarrhea, unspecified: Secondary | ICD-10-CM | POA: Diagnosis present

## 2021-05-24 DIAGNOSIS — Z91199 Patient's noncompliance with other medical treatment and regimen due to unspecified reason: Secondary | ICD-10-CM

## 2021-05-24 DIAGNOSIS — I6381 Other cerebral infarction due to occlusion or stenosis of small artery: Principal | ICD-10-CM | POA: Diagnosis present

## 2021-05-24 DIAGNOSIS — Z7982 Long term (current) use of aspirin: Secondary | ICD-10-CM

## 2021-05-24 DIAGNOSIS — F17210 Nicotine dependence, cigarettes, uncomplicated: Secondary | ICD-10-CM | POA: Diagnosis present

## 2021-05-24 DIAGNOSIS — R531 Weakness: Secondary | ICD-10-CM | POA: Diagnosis not present

## 2021-05-24 DIAGNOSIS — R7989 Other specified abnormal findings of blood chemistry: Secondary | ICD-10-CM

## 2021-05-24 DIAGNOSIS — R569 Unspecified convulsions: Secondary | ICD-10-CM

## 2021-05-24 DIAGNOSIS — Z23 Encounter for immunization: Secondary | ICD-10-CM

## 2021-05-24 DIAGNOSIS — E785 Hyperlipidemia, unspecified: Secondary | ICD-10-CM | POA: Diagnosis present

## 2021-05-24 DIAGNOSIS — Z20822 Contact with and (suspected) exposure to covid-19: Secondary | ICD-10-CM | POA: Diagnosis present

## 2021-05-24 DIAGNOSIS — I639 Cerebral infarction, unspecified: Secondary | ICD-10-CM

## 2021-05-24 DIAGNOSIS — I63511 Cerebral infarction due to unspecified occlusion or stenosis of right middle cerebral artery: Secondary | ICD-10-CM

## 2021-05-24 DIAGNOSIS — Z79899 Other long term (current) drug therapy: Secondary | ICD-10-CM

## 2021-05-24 DIAGNOSIS — R29810 Facial weakness: Secondary | ICD-10-CM | POA: Diagnosis present

## 2021-05-24 DIAGNOSIS — Z6841 Body Mass Index (BMI) 40.0 and over, adult: Secondary | ICD-10-CM

## 2021-05-24 DIAGNOSIS — G4733 Obstructive sleep apnea (adult) (pediatric): Secondary | ICD-10-CM | POA: Diagnosis present

## 2021-05-24 DIAGNOSIS — I10 Essential (primary) hypertension: Secondary | ICD-10-CM | POA: Diagnosis present

## 2021-05-24 DIAGNOSIS — R202 Paresthesia of skin: Secondary | ICD-10-CM

## 2021-05-24 DIAGNOSIS — R4781 Slurred speech: Secondary | ICD-10-CM | POA: Diagnosis not present

## 2021-05-24 DIAGNOSIS — D72829 Elevated white blood cell count, unspecified: Secondary | ICD-10-CM | POA: Diagnosis present

## 2021-05-24 DIAGNOSIS — R29701 NIHSS score 1: Secondary | ICD-10-CM | POA: Diagnosis present

## 2021-05-24 DIAGNOSIS — Z7902 Long term (current) use of antithrombotics/antiplatelets: Secondary | ICD-10-CM

## 2021-05-24 LAB — CBC
HCT: 43.9 % (ref 39.0–52.0)
Hemoglobin: 14.6 g/dL (ref 13.0–17.0)
MCH: 28.5 pg (ref 26.0–34.0)
MCHC: 33.3 g/dL (ref 30.0–36.0)
MCV: 85.6 fL (ref 80.0–100.0)
Platelets: 313 10*3/uL (ref 150–400)
RBC: 5.13 MIL/uL (ref 4.22–5.81)
RDW: 13.3 % (ref 11.5–15.5)
WBC: 15.1 10*3/uL — ABNORMAL HIGH (ref 4.0–10.5)
nRBC: 0 % (ref 0.0–0.2)

## 2021-05-24 LAB — COMPREHENSIVE METABOLIC PANEL
ALT: 64 U/L — ABNORMAL HIGH (ref 0–44)
AST: 46 U/L — ABNORMAL HIGH (ref 15–41)
Albumin: 3.5 g/dL (ref 3.5–5.0)
Alkaline Phosphatase: 88 U/L (ref 38–126)
Anion gap: 8 (ref 5–15)
BUN: 10 mg/dL (ref 6–20)
CO2: 23 mmol/L (ref 22–32)
Calcium: 8.7 mg/dL — ABNORMAL LOW (ref 8.9–10.3)
Chloride: 104 mmol/L (ref 98–111)
Creatinine, Ser: 0.98 mg/dL (ref 0.61–1.24)
GFR, Estimated: >60 mL/min (ref >60)
Glucose, Bld: 108 mg/dL — ABNORMAL HIGH (ref 70–99)
Potassium: 3.6 mmol/L (ref 3.5–5.1)
Sodium: 135 mmol/L (ref 135–145)
Total Bilirubin: 0.8 mg/dL (ref 0.3–1.2)
Total Protein: 7.6 g/dL (ref 6.5–8.1)

## 2021-05-24 LAB — DIFFERENTIAL
Abs Immature Granulocytes: 0.08 10*3/uL — ABNORMAL HIGH (ref 0.00–0.07)
Basophils Absolute: 0.1 10*3/uL (ref 0.0–0.1)
Basophils Relative: 1 %
Eosinophils Absolute: 0.4 10*3/uL (ref 0.0–0.5)
Eosinophils Relative: 3 %
Immature Granulocytes: 1 %
Lymphocytes Relative: 28 %
Lymphs Abs: 4.2 10*3/uL — ABNORMAL HIGH (ref 0.7–4.0)
Monocytes Absolute: 1.6 10*3/uL — ABNORMAL HIGH (ref 0.1–1.0)
Monocytes Relative: 10 %
Neutro Abs: 8.8 10*3/uL — ABNORMAL HIGH (ref 1.7–7.7)
Neutrophils Relative %: 57 %

## 2021-05-24 LAB — PROTIME-INR
INR: 1 (ref 0.8–1.2)
Prothrombin Time: 12.9 seconds (ref 11.4–15.2)

## 2021-05-24 LAB — APTT: aPTT: 29 seconds (ref 24–36)

## 2021-05-24 LAB — ETHANOL: Alcohol, Ethyl (B): <10 mg/dL (ref <10)

## 2021-05-24 IMAGING — CT CT HEAD W/O CM
4 series · 15 of 47 positions shown, 17 images · non-contrast
Comparison: CT head [DATE], MR head [DATE]

CLINICAL DATA: MRI head [DATE], CT head [DATE]

EXAM:
CT HEAD WITHOUT CONTRAST
TECHNIQUE: Contiguous axial images were obtained from the base of the skull
through the vertex without intravenous contrast.

[Series 3: head wo · axial · 0.45mm/px · z∈[+1334,+1454]mm · 7 of 32 slices shown, 9 images]
[im 4/32  brain]
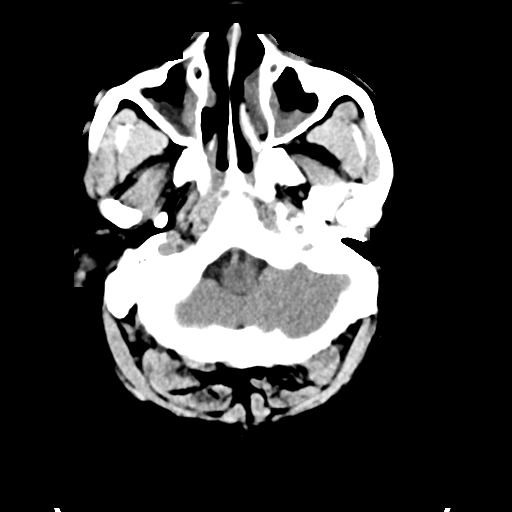
[im 4/32  bone]
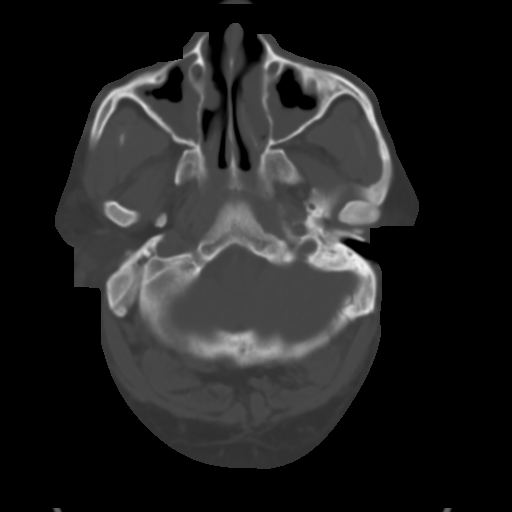
[im 8/32  brain]
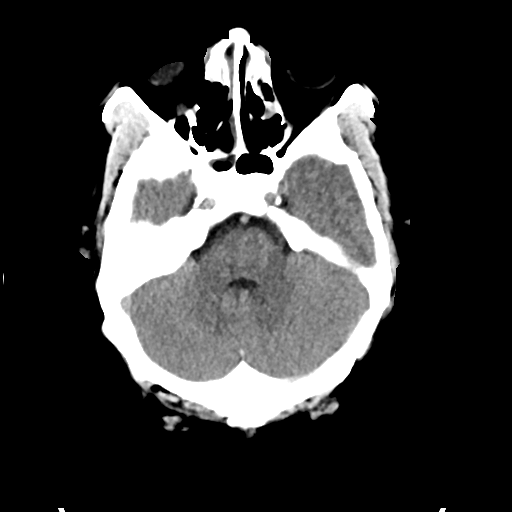
[im 12/32  brain]
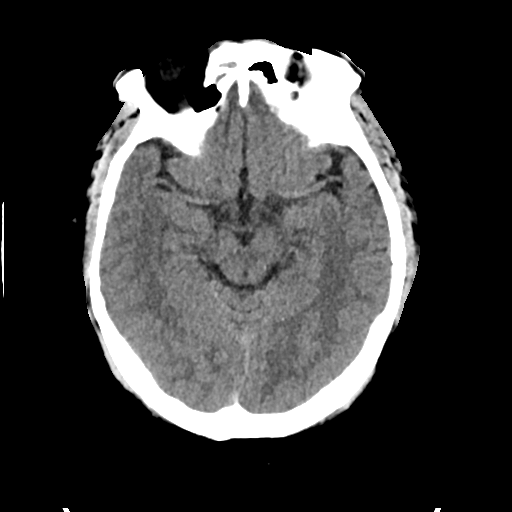
[im 16/32  brain]
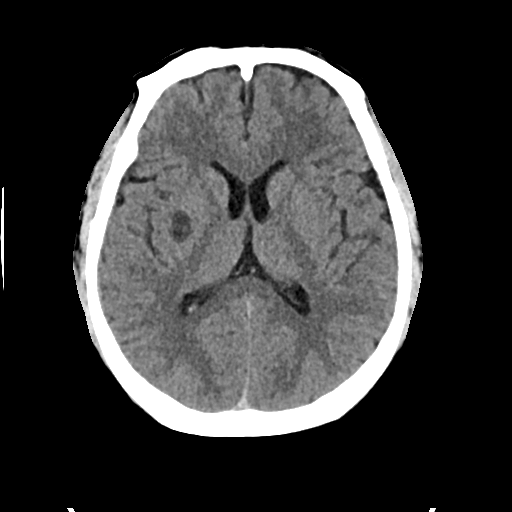
[im 20/32  brain]
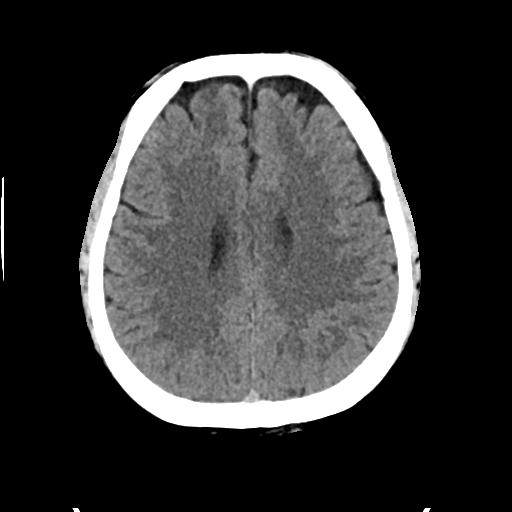
[im 20/32  bone]
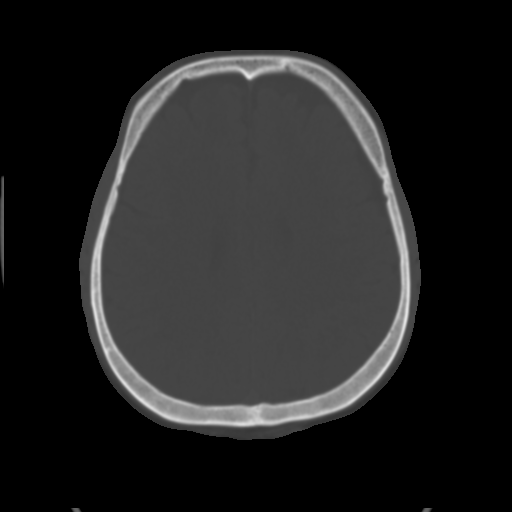
[im 24/32  brain]
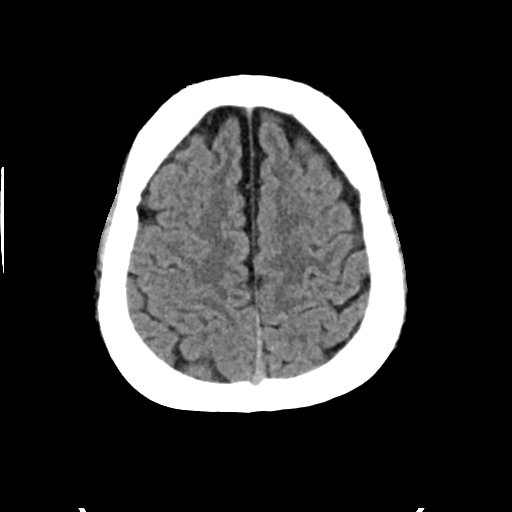
[im 28/32  brain]
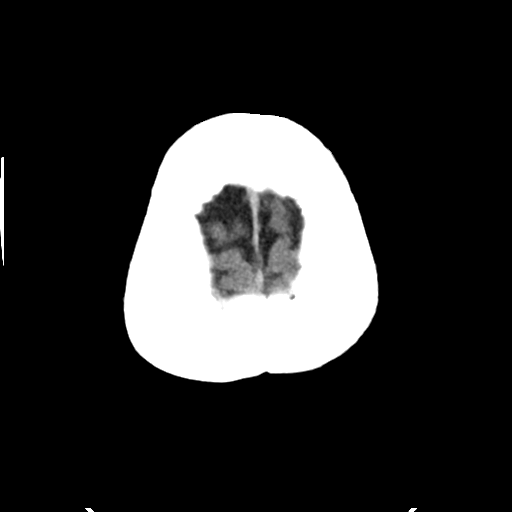

[Series 4: head bone · axial · 0.45mm/px · z∈[+1333,+1349]mm · 2 of 80 slices shown]
[im 8/80  bone]
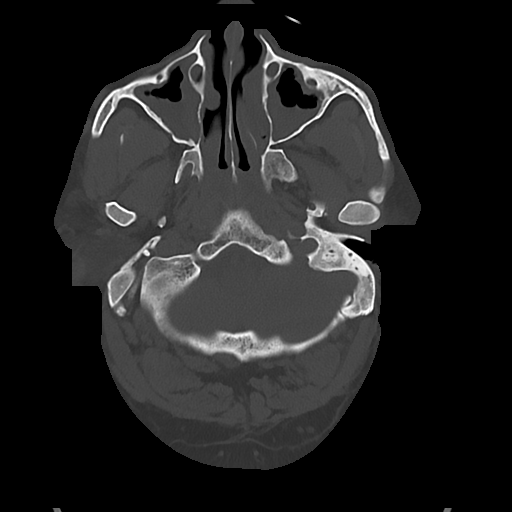
[im 16/80  bone]
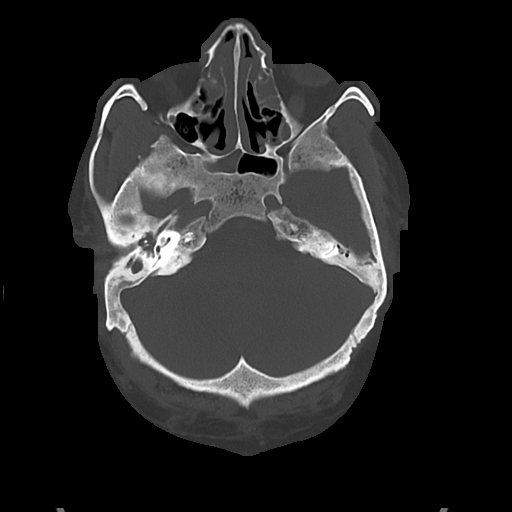

[Series 5: cor soft · coronal · 0.29mm/px · 3 of 76 slices shown]
[im 26/76  brain]
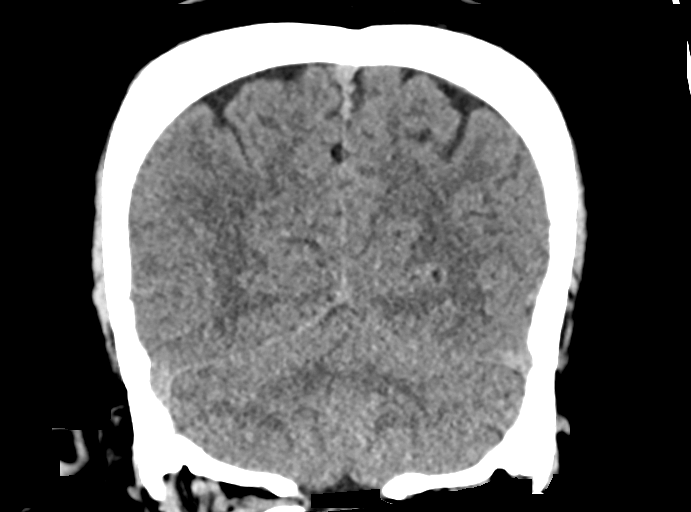
[im 34/76  brain]
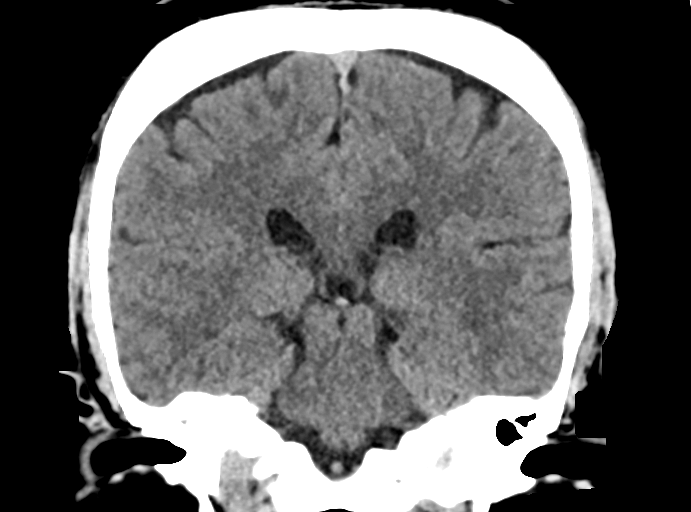
[im 42/76  brain]
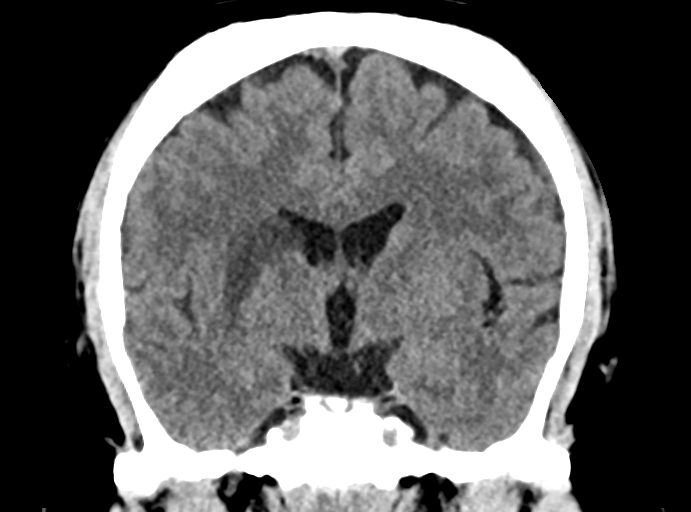

[Series 6: sag soft · sagittal · 0.29mm/px · 3 of 68 slices shown]
[im 23/68  brain]
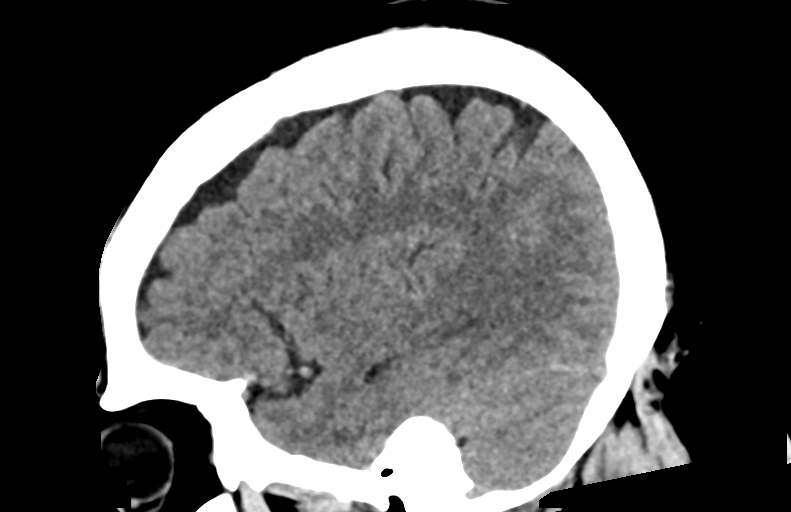
[im 34/68  brain]
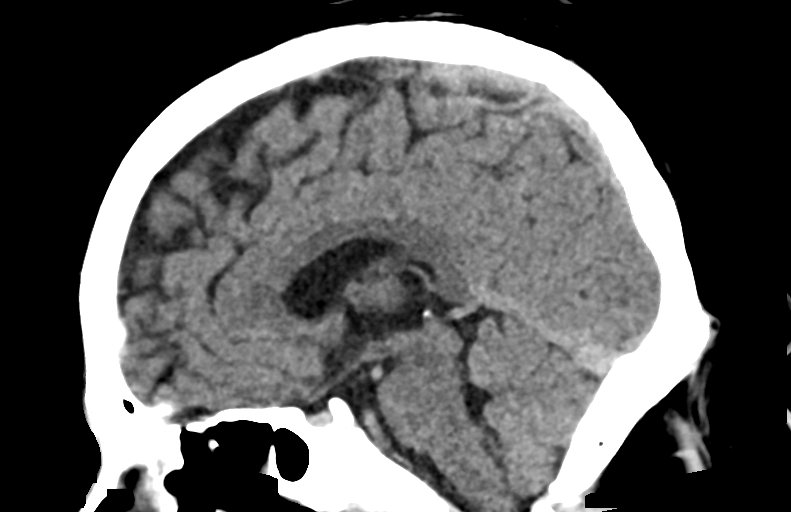
[im 45/68  brain]
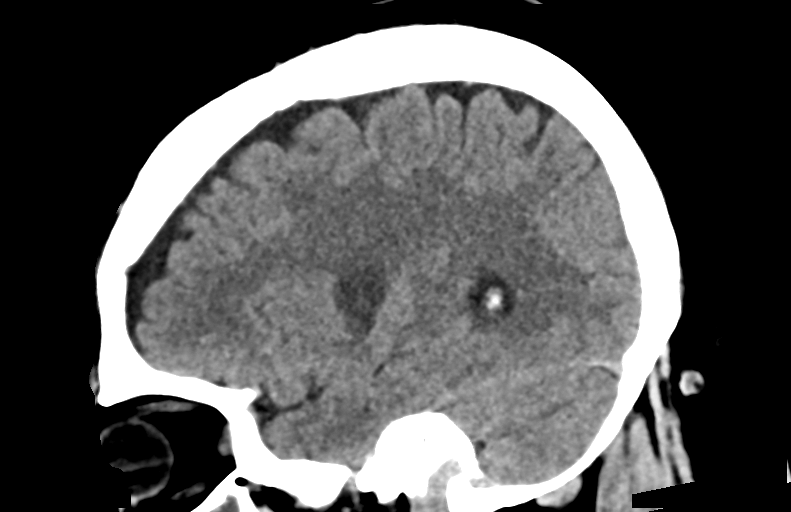

[15 of 47 positions shown; findings below may reference images not displayed]

FINDINGS: Brain:

Subacute appearing right basal ganglia and corona radiata
infarction. No evidence of large-territorial acute infarction. No
parenchymal hemorrhage. No mass lesion. No extra-axial collection.

No mass effect or midline shift. No hydrocephalus. Basilar cisterns
are patent.

Vascular: No hyperdense vessel.

Skull: No acute fracture or focal lesion.

Sinuses/Orbits: Bilateral maxillary, sphenoid, ethmoid, frontal
mucosal thickening. Right mastoid air cell destruction and effusion
with fluid noted within the right middle ear. The orbits are
unremarkable.

Other: None.
IMPRESSION: 1. Subacute appearing right basal ganglia and corona radiata
infarction.
2. Pansinusitis as well as right mastoid air cell effusion and fluid
within the right middle ear. Correlate with infection.

## 2021-05-24 NOTE — ED Triage Notes (Addendum)
Pt here POV with c/o left side facial droop and left sided weakness, numbness. LSN 05/23/21 at 2000

## 2021-05-24 NOTE — ED Notes (Signed)
Pt ambulatory to and from restroom  

## 2021-05-24 NOTE — ED Provider Notes (Signed)
[name] EMERGENCY DEPARTMENT Provider Note   CSN: [id number] Arrival date & time: 05/24/21  1827     History Chief Complaint  Patient presents with   Stroke Symptoms    Ernest [name] is a 35 y.o. 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 He was seen and admitted from 05/22/2021 until 05/23/2021 after he had a stroke.  During this he had left-sided weakness and speech difficulties.  These all improved significantly prior to his discharge. Patient and family at bedside reports that yesterday his symptoms had fully improved and he had minimal if any symptoms at the time of discharge however at about 8 PM he began developing left-sided weakness.  This was initially more in the leg which has waxed and waned, is mostly now in the left arm and has intermittent facial weakness.  His wife describes these as "episodes."  He has not had any fevers.  No chest pain, cough, or shortness of breath.  They filled the prescriptions that he was given at his discharge and report he has been taking those.  No trauma, he has not hit his head.  HPI     Past Medical History:  Diagnosis Date   Compartment syndrome of upper arm [facility])     Patient Active Problem List   Diagnosis Date Noted   CVA (cerebral vascular accident) (HCC) 05/22/2021   Obesity, Class III, BMI 40-49.9 (morbid obesity) (HCC) 05/22/2021   Leukocytosis 05/22/2021   Hypocalcemia 05/22/2021   Transaminitis 05/22/2021   Compartment syndrome (HCC) 05/22/2021   Hyperlipidemia 05/22/2021   OSA (obstructive sleep apnea)    Smoker     Past Surgical History:  Procedure Laterality Date   DECOMPRESSION FASCIOTOMY FOREARM Right        Family History  Problem Relation Age of Onset   Diabetes Neg Hx    Cancer Neg Hx    Heart failure Neg Hx    Hyperlipidemia Neg Hx    Hypertension Neg Hx     Social  History   Tobacco Use   Smoking status: Every Day    Packs/day: 0.01    Types: Cigarettes   Smokeless tobacco: Never  Substance Use Topics   Alcohol use: Never   Drug use: Never    Home Medications Prior to Admission medications   Medication Sig Start Date End Date Taking? Authorizing Provider  acetaminophen (TYLENOL) 500 MG tablet Take 1,000 mg by mouth every 6 (six) hours as needed for pain.    [provider]  aspirin 325 MG EC tablet Take 1 tablet (325 mg total) by mouth daily. 05/23/21   Marinda Elk, MD  atorvastatin (LIPITOR) 80 MG tablet Take 1 tablet (80 mg total) by mouth daily. 05/23/21   Marinda Elk, MD  clopidogrel (PLAVIX) 75 MG tablet Take 1 tablet (75 mg total) by mouth daily. 05/23/21 08/21/21  Marinda Elk, MD    Allergies    Patient has no known allergies.  Review of Systems   Review of Systems  Constitutional:  Negative for chills and fever.  Respiratory:  Negative for choking.   Cardiovascular:  Negative for chest pain.  Gastrointestinal:  Negative for abdominal pain.  Genitourinary:  Negative for dysuria.  Musculoskeletal:  Negative for back pain and neck pain.  Skin:  Negative for color change, rash and wound.  Neurological:  Positive for dizziness, facial asymmetry, speech difficulty and  weakness.  All other systems reviewed and are negative.  Physical Exam Updated Vital Signs BP (!) 134/98   Pulse 68   Temp 98.8 F (37.1 C)   Resp (!) 22   SpO2 96%   Physical Exam Vitals and nursing note reviewed.  Constitutional:      General: He is not in acute distress.    Appearance: He is not diaphoretic.  HENT:     Head: Normocephalic and atraumatic.  Eyes:     General: No scleral icterus.       Right eye: No discharge.        Left eye: No discharge.     Conjunctiva/sclera: Conjunctivae normal.  Cardiovascular:     Rate and Rhythm: Normal rate and regular rhythm.     Heart sounds: Normal heart sounds.   Pulmonary:     Effort: Pulmonary effort is normal. No respiratory distress.     Breath sounds: Normal breath sounds. No stridor.  Abdominal:     General: There is no distension.     Tenderness: There is no abdominal tenderness. There is no guarding.  Musculoskeletal:        General: No deformity.     Cervical back: Normal range of motion and neck supple. No rigidity.     Comments: No pronator drift.  5/5 grip strength bilaterally.  Able to lift legs bilaterally  Skin:    General: Skin is warm and dry.  Neurological:     Mental Status: He is alert and oriented to person, place, and time.     Motor: No abnormal muscle tone.     Comments: Patient is awake and alert.  His speech is slightly slurred.  He has left-sided facial droop.  He has decreased sensation to light touch primarily over the left hand.  No pronator drift.  5/5 grip strength bilaterally.  5/5 strength in legs bilaterally.  He is able to count fingers with each eye independently. He is noted to have frequent abnormal appearing blinking episodes.  He does not have any nystagmus or other twitching during these events.  He does not lose consciousness or tone during these events  Psychiatric:        Mood and Affect: Mood normal.        Behavior: Behavior normal.    ED Results / Procedures / Treatments   Labs (all labs ordered are listed, but only abnormal results are displayed) Labs Reviewed  CBC - Abnormal; Notable for the following components:      Result Value   WBC 15.1 (*)    All other components within normal limits  DIFFERENTIAL - Abnormal; Notable for the following components:   Neutro Abs 8.8 (*)    Lymphs Abs 4.2 (*)    Monocytes Absolute 1.6 (*)    Abs Immature Granulocytes 0.08 (*)    All other components within normal limits  COMPREHENSIVE METABOLIC PANEL - Abnormal; Notable for the following components:   Glucose, Bld 108 (*)    Calcium 8.7 (*)    AST 46 (*)    ALT 64 (*)    All other components within  normal limits  ETHANOL  PROTIME-INR  APTT  RAPID URINE DRUG SCREEN, HOSP PERFORMED  URINALYSIS, ROUTINE W REFLEX MICROSCOPIC  I-STAT CHEM 8, ED    EKG None  Radiology CT HEAD WO CONTRAST  Result Date: 05/24/2021 CLINICAL DATA:  MRI head 05/22/2021, CT head 05/22/2021 EXAM: CT HEAD WITHOUT CONTRAST TECHNIQUE: Contiguous axial images were obtained from  the base of the skull through the vertex without intravenous contrast. COMPARISON:  CT head 05/22/2021, MR head 05/22/2021 FINDINGS: Brain: Subacute appearing right basal ganglia and corona radiata infarction. No evidence of large-territorial acute infarction. No parenchymal hemorrhage. No mass lesion. No extra-axial collection. No mass effect or midline shift. No hydrocephalus. Basilar cisterns are patent. Vascular: No hyperdense vessel. Skull: No acute fracture or focal lesion. Sinuses/Orbits: Bilateral maxillary, sphenoid, ethmoid, frontal mucosal thickening. Right mastoid air cell destruction and effusion with fluid noted within the right middle ear. The orbits are unremarkable. Other: None. IMPRESSION: 1. Subacute appearing right basal ganglia and corona radiata infarction. 2. Pansinusitis as well as right mastoid air cell effusion and fluid within the right middle ear. Correlate with infection. Electronically Signed   By: Tish Frederickson M.D.   On: 05/24/2021 20:10   ECHOCARDIOGRAM COMPLETE  Result Date: 05/23/2021    ECHOCARDIOGRAM REPORT   Patient Name:   [facility] Date of Exam: 05/23/2021 Medical Rec #:  [id number]      Height:       63.0 in Accession #:    [id number]     Weight:       313.1 lb Date of Birth:  September 17, 1985      BSA:          2.339 m Patient Age:    35 years       BP:           124/91 mmHg Patient Gender: M              HR:           75 bpm. Exam Location:  Inpatient Procedure: 2D Echo Indications:    TIA  History:        Patient has no prior history of Echocardiogram examinations.                 Risk Factors:Sleep Apnea,  Current Smoker and Dyslipidemia.  Sonographer:    Delcie Roch RDCS Referring Phys: [phone] RONDELL A SMITH IMPRESSIONS  1. Left ventricular ejection fraction, by estimation, is 60 to 65%. The left ventricle has normal function. The left ventricle has no regional wall motion abnormalities. There is mild left ventricular hypertrophy. Left ventricular diastolic parameters were normal.  2. Right ventricular systolic function is normal. The right ventricular size is normal.  3. The mitral valve is normal in structure. No evidence of mitral valve regurgitation. No evidence of mitral stenosis.  4. The aortic valve has an indeterminant number of cusps. Aortic valve regurgitation is not visualized. No aortic stenosis is present.  5. The inferior vena cava is normal in size with greater than 50% respiratory variability, suggesting right atrial pressure of 3 mmHg. Comparison(s): No prior Echocardiogram. FINDINGS  Left Ventricle: Left ventricular ejection fraction, by estimation, is 60 to 65%. The left ventricle has normal function. The left ventricle has no regional wall motion abnormalities. The left ventricular internal cavity size was normal in size. There is  mild left ventricular hypertrophy. Left ventricular diastolic parameters were normal. Right Ventricle: The right ventricular size is normal. Right ventricular systolic function is normal. Left Atrium: Left atrial size was normal in size. Right Atrium: Right atrial size was normal in size. Pericardium: There is no evidence of pericardial effusion. Mitral Valve: The mitral valve is normal in structure. No evidence of mitral valve regurgitation. No evidence of mitral valve stenosis. Tricuspid Valve: The tricuspid valve is normal in structure. Tricuspid valve regurgitation is trivial. No  evidence of tricuspid stenosis. Aortic Valve: The aortic valve has an indeterminant number of cusps. Aortic valve regurgitation is not visualized. No aortic stenosis is present.  Aortic valve mean gradient measures 4.0 mmHg. Aortic valve peak gradient measures 7.5 mmHg. Aortic valve area, by VTI measures 2.51 cm. Pulmonic Valve: The pulmonic valve was not well visualized. Pulmonic valve regurgitation is not visualized. No evidence of pulmonic stenosis. Aorta: The aortic root is normal in size and structure. Venous: The inferior vena cava is normal in size with greater than 50% respiratory variability, suggesting right atrial pressure of 3 mmHg. IAS/Shunts: No atrial level shunt detected by color flow Doppler.  LEFT VENTRICLE PLAX 2D LVIDd:         4.10 cm   Diastology LVIDs:         3.00 cm   LV e' medial:    8.27 cm/s LV PW:         1.10 cm   LV E/e' medial:  10.6 LV IVS:        1.20 cm   LV e' lateral:   13.70 cm/s LVOT diam:     1.90 cm   LV E/e' lateral: 6.4 LV SV:         64 LV SV Index:   28 LVOT Area:     2.84 cm  RIGHT VENTRICLE             IVC RV Basal diam:  3.70 cm     IVC diam: 1.60 cm RV S prime:     13.80 cm/s TAPSE (M-mode): 1.5 cm LEFT ATRIUM             Index        RIGHT ATRIUM           Index LA diam:        3.40 cm 1.45 cm/m   RA Area:     22.50 cm LA Vol (A2C):   36.9 ml 15.77 ml/m  RA Volume:   75.90 ml  32.45 ml/m LA Vol (A4C):   44.0 ml 18.81 ml/m LA Biplane Vol: 42.4 ml 18.13 ml/m  AORTIC VALVE AV Area (Vmax):    2.65 cm AV Area (Vmean):   2.46 cm AV Area (VTI):     2.51 cm AV Vmax:           137.00 cm/s AV Vmean:          90.200 cm/s AV VTI:            0.256 m AV Peak Grad:      7.5 mmHg AV Mean Grad:      4.0 mmHg LVOT Vmax:         128.00 cm/s LVOT Vmean:        78.400 cm/s LVOT VTI:          0.227 m LVOT/AV VTI ratio: 0.89  AORTA Ao Root diam: 2.90 cm Ao Asc diam:  2.90 cm MITRAL VALVE MV Area (PHT): 3.50 cm    SHUNTS MV Decel Time: 217 msec    Systemic VTI:  0.23 m MV E velocity: 87.40 cm/s  Systemic Diam: 1.90 cm MV A velocity: 69.40 cm/s MV E/A ratio:  1.26 Olga Millers MD Electronically signed by Olga Millers MD Signature Date/Time:  05/23/2021/10:59:21 AM    Final     Procedures Procedures   Medications Ordered in ED Medications - No data to display  ED Course  I have reviewed the triage vital signs and the  nursing notes.  Pertinent labs & imaging results that were available during my care of the patient were reviewed by me and considered in my medical decision making (see chart for details).  Clinical Course as of 05/25/21 0056  Sat May 24, 2021  1849 MSE performed on patient's arrival by myself [EH]  2021 I spoke with neurology who will see patient.  [EH]  Sun May 25, 2021  0005 I spoke with neurology who will see the patient. [EH]  0022 I spoke with neurology who reviewed patient's chart, is in agreement with admission and will see the patient.  [EH]  [phone] I spoke with hospitalist who will see patient for admission. [EH]    Clinical Course User Index [EH] Cristina Gong, PA-C   MDM Rules/Calculators/A&P                          Is a 35 year old man who presents today for evaluation of recurrent reports of left-sided weakness, left-sided facial droop.  He was recently admitted and discharged after he was found to have had a 3.4 cm acute infarct in the right basal ganglia and right corona radiata.  He had a reassuring EEG and was discharged. While in the emergency room he had waxing and waning left-sided facial droop.  He had subjective numbness over the left hand.  He felt like his left hand was weak, however I did not appreciate any obvious deficit in grip strength, and he did not have any pronator drift bilaterally.  In speaking with patient and his wife through professional Nepali speaking interpreter she describes these as episodes.  He was noted to have abnormal blinking movements while in the emergency room however did not have any nystagmus/deviation, and no twitching/jerking movements otherwise.  Repeat CT head is obtained given recurrence of symptoms without any new abnormalities noted.  It  appears he did have an EEG during his admission, however abnormalities or seizures were not noted.  At the time of the patient's evaluation he was outside of tPA window, and while he did have weakness and did not meet LVO criteria therefore code stroke was not called, however I did speak with Dr. Marisue Humble who had evaluated patient at his initial presentation recently.  Recommends medicine admission, he will see the patient in consult with consideration for repeat MRIs, repeat EEG monitoring.    Labs were obtained, CBC does show slight leukocytosis with a white count of 15.1.  CMP without any acute changes when compared to his previous visit.  Patient will require admission.  The patient appears reasonably stabilized for admission considering the current resources, flow, and capabilities available in the ED at this time, and I doubt any other [facility] requiring further screening and/or treatment in the ED prior to admission assuming timely admission and bed placement.  Note: Portions of this report may have been transcribed using voice recognition software. 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

## 2021-05-25 ENCOUNTER — Observation Stay (HOSPITAL_COMMUNITY): Payer: 59

## 2021-05-25 ENCOUNTER — Encounter (HOSPITAL_COMMUNITY): Payer: Self-pay | Admitting: Family Medicine

## 2021-05-25 ENCOUNTER — Observation Stay (HOSPITAL_BASED_OUTPATIENT_CLINIC_OR_DEPARTMENT_OTHER): Payer: 59

## 2021-05-25 ENCOUNTER — Other Ambulatory Visit: Payer: Self-pay

## 2021-05-25 DIAGNOSIS — I639 Cerebral infarction, unspecified: Secondary | ICD-10-CM | POA: Diagnosis not present

## 2021-05-25 DIAGNOSIS — R569 Unspecified convulsions: Secondary | ICD-10-CM

## 2021-05-25 DIAGNOSIS — R202 Paresthesia of skin: Secondary | ICD-10-CM

## 2021-05-25 LAB — I-STAT CHEM 8, ED
BUN: 10 mg/dL (ref 6–20)
Calcium, Ion: 1.1 mmol/L — ABNORMAL LOW (ref 1.15–1.40)
Chloride: 104 mmol/L (ref 98–111)
Creatinine, Ser: 0.9 mg/dL (ref 0.61–1.24)
Glucose, Bld: 106 mg/dL — ABNORMAL HIGH (ref 70–99)
HCT: 46 % (ref 39.0–52.0)
Hemoglobin: 15.6 g/dL (ref 13.0–17.0)
Potassium: 3.8 mmol/L (ref 3.5–5.1)
Sodium: 139 mmol/L (ref 135–145)
TCO2: 23 mmol/L (ref 22–32)

## 2021-05-25 LAB — RAPID URINE DRUG SCREEN, HOSP PERFORMED
Amphetamines: NOT DETECTED
Barbiturates: NOT DETECTED
Benzodiazepines: NOT DETECTED
Cocaine: NOT DETECTED
Opiates: NOT DETECTED
Tetrahydrocannabinol: NOT DETECTED

## 2021-05-25 LAB — CBC
HCT: 43.4 % (ref 39.0–52.0)
Hemoglobin: 14.4 g/dL (ref 13.0–17.0)
MCH: 28.3 pg (ref 26.0–34.0)
MCHC: 33.2 g/dL (ref 30.0–36.0)
MCV: 85.4 fL (ref 80.0–100.0)
Platelets: 294 10*3/uL (ref 150–400)
RBC: 5.08 MIL/uL (ref 4.22–5.81)
RDW: 13.5 % (ref 11.5–15.5)
WBC: 15.1 10*3/uL — ABNORMAL HIGH (ref 4.0–10.5)
nRBC: 0 % (ref 0.0–0.2)

## 2021-05-25 LAB — COMPREHENSIVE METABOLIC PANEL
ALT: 68 U/L — ABNORMAL HIGH (ref 0–44)
AST: 51 U/L — ABNORMAL HIGH (ref 15–41)
Albumin: 3.5 g/dL (ref 3.5–5.0)
Alkaline Phosphatase: 84 U/L (ref 38–126)
Anion gap: 9 (ref 5–15)
BUN: 10 mg/dL (ref 6–20)
CO2: 22 mmol/L (ref 22–32)
Calcium: 8.9 mg/dL (ref 8.9–10.3)
Chloride: 105 mmol/L (ref 98–111)
Creatinine, Ser: 1.02 mg/dL (ref 0.61–1.24)
GFR, Estimated: >60 mL/min (ref >60)
Glucose, Bld: 104 mg/dL — ABNORMAL HIGH (ref 70–99)
Potassium: 3.6 mmol/L (ref 3.5–5.1)
Sodium: 136 mmol/L (ref 135–145)
Total Bilirubin: 1 mg/dL (ref 0.3–1.2)
Total Protein: 7.4 g/dL (ref 6.5–8.1)

## 2021-05-25 LAB — URINALYSIS, ROUTINE W REFLEX MICROSCOPIC
Bilirubin Urine: NEGATIVE
Glucose, UA: NEGATIVE mg/dL
Ketones, ur: 5 mg/dL — AB
Leukocytes,Ua: NEGATIVE
Nitrite: NEGATIVE
Protein, ur: 100 mg/dL — AB
Specific Gravity, Urine: 1.028 (ref 1.005–1.030)
pH: 5 (ref 5.0–8.0)

## 2021-05-25 LAB — RESP PANEL BY RT-PCR (FLU A&B, COVID) ARPGX2
Influenza A by PCR: NEGATIVE
Influenza B by PCR: NEGATIVE
SARS Coronavirus 2 by RT PCR: NEGATIVE

## 2021-05-25 IMAGING — MR MR HEAD W/O CM
13 of 18 series · 35 of 48 positions shown · non-contrast
Comparison: Brain MRI and intracranial MRA [DATE].

CLINICAL DATA: 35-year-old male with neurologic deficit. Right
basal ganglia lacunar infarct 3 days ago.

EXAM:
MRI HEAD WITHOUT CONTRAST
TECHNIQUE: Multiplanar, multiecho pulse sequences of the brain and surrounding
structures were obtained without intravenous contrast.

[Series 5: DWI · axial · 3.0mm · 0.88mm/px · z∈[-114,+48]mm · 5 of 110 slices shown (1 of 4)]
[im 1/110]
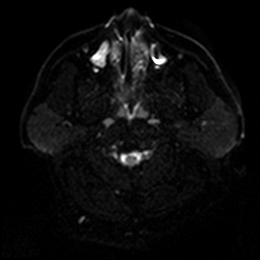
[im 28/110]
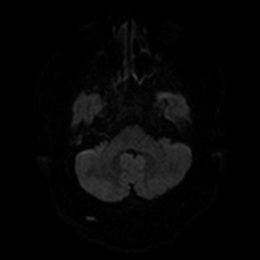
[im 55/110]
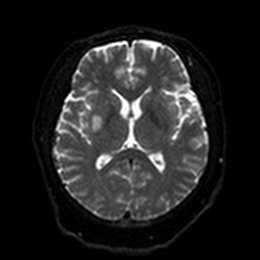
[im 82/110]
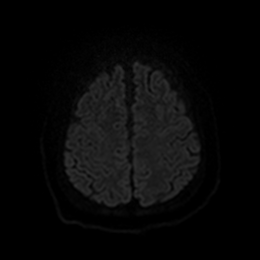
[im 110/110]
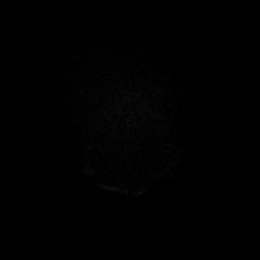

[Series 6: DWI · axial · 3.0mm · 0.88mm/px · z∈[-114,+48]mm · 2 of 55 slices shown (2 of 4)]
[im 1/55]
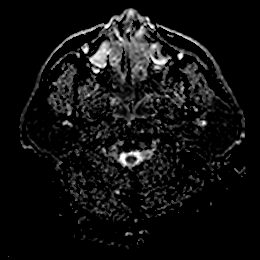
[im 55/55]
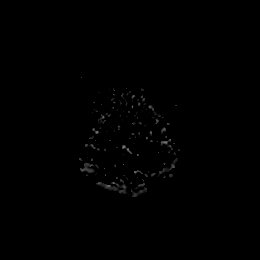

[Series 7: DWI · coronal · 4.0mm · 0.88mm/px · 4 of 70 slices shown (3 of 4)]
[im 1/70]
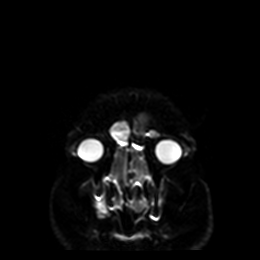
[im 24/70]
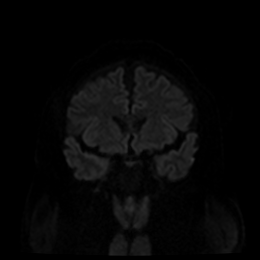
[im 47/70]
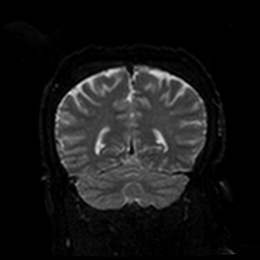
[im 70/70]
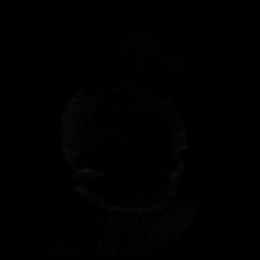

[Series 8: DWI · coronal · 4.0mm · 0.88mm/px · 2 of 35 slices shown (4 of 4)]
[im 1/35]
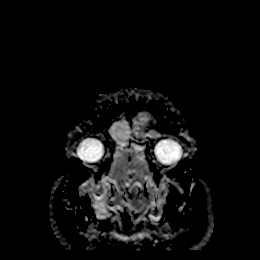
[im 35/35]
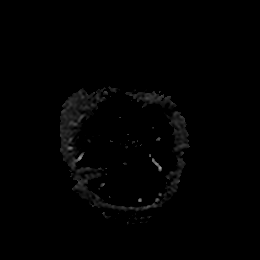

[Series 9: T1 · sagittal · 5.0mm · 0.75mm/px · 2 of 26 slices shown]
[im 1/26]
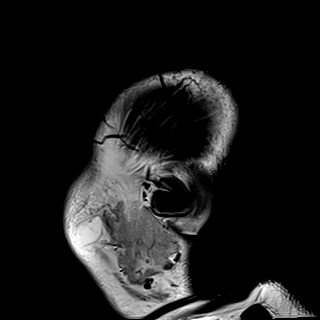
[im 26/26]
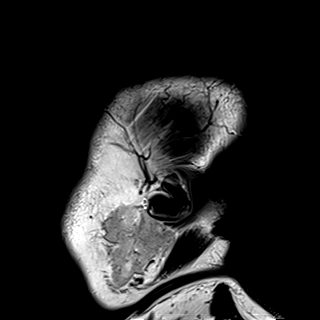

[Series 10: T2 · axial · 5.0mm · 0.72mm/px · z∈[-113,+49]mm · 2 of 28 slices shown (1 of 2)]
[im 1/28]
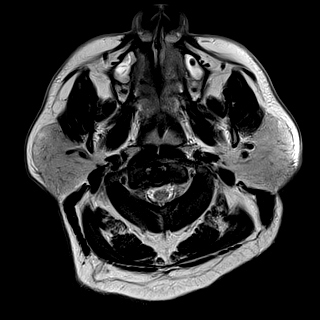
[im 28/28]
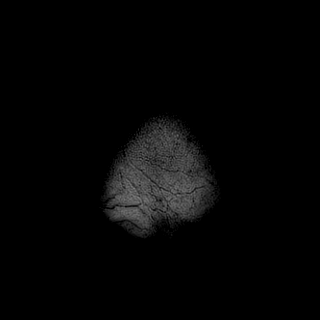

[Series 11: FLAIR · axial · 5.0mm · 0.45mm/px · z∈[-113,+49]mm · 2 of 28 slices shown (1 of 2)]
[im 1/28]
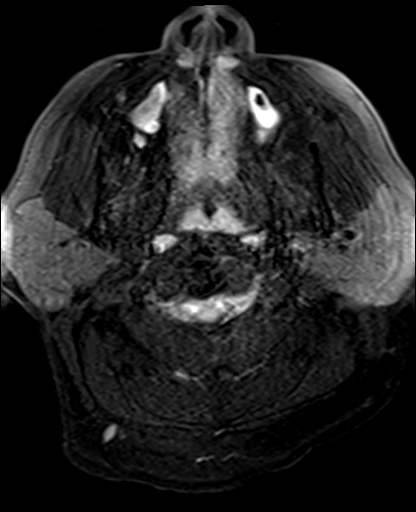
[im 28/28]
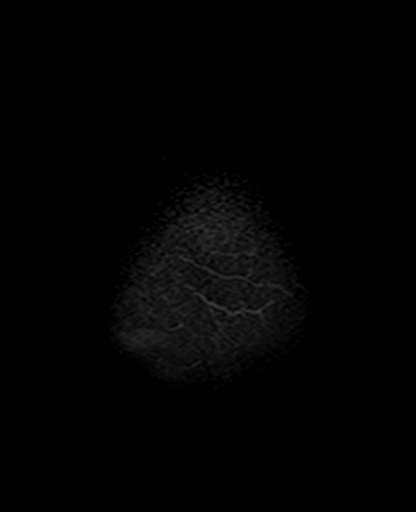

[Series 12: mag_images · axial · 3.0mm · 0.90mm/px · z∈[-116,+49]mm · 3 of 56 slices shown]
[im 1/56]
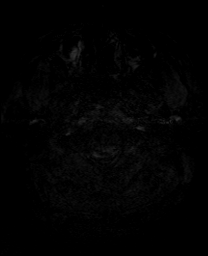
[im 28/56]
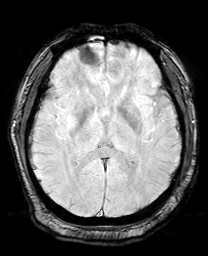
[im 56/56]
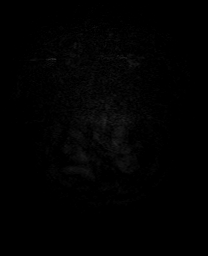

[Series 13: pha_images · axial · 3.0mm · 0.90mm/px · z∈[-116,+49]mm · 3 of 56 slices shown]
[im 1/56]
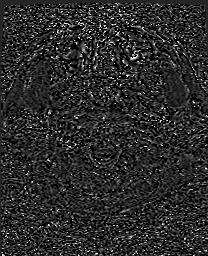
[im 28/56]
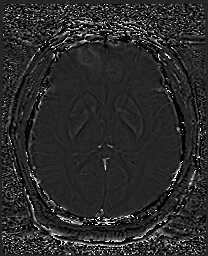
[im 56/56]
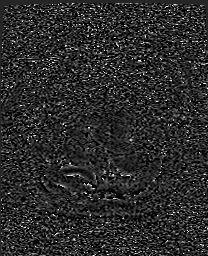

[Series 14: swi_images · axial · 3.0mm · 0.90mm/px · z∈[-116,+49]mm · 3 of 56 slices shown]
[im 1/56]
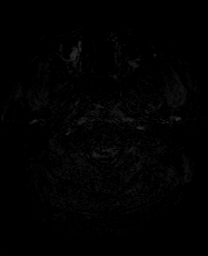
[im 28/56]
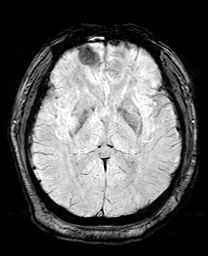
[im 56/56]
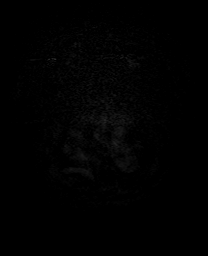

[Series 15: mip_images(sw) · axial · 24.0mm · 0.90mm/px · z∈[-105,+39]mm · 3 of 49 slices shown]
[im 1/49]
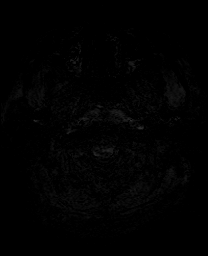
[im 25/49]
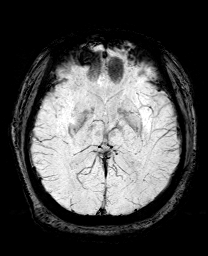
[im 49/49]
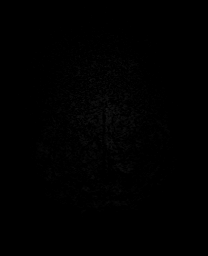

[Series 17: T2 · coronal · 5.0mm · 0.34mm/px · 2 of 31 slices shown (2 of 2)]
[im 1/31]
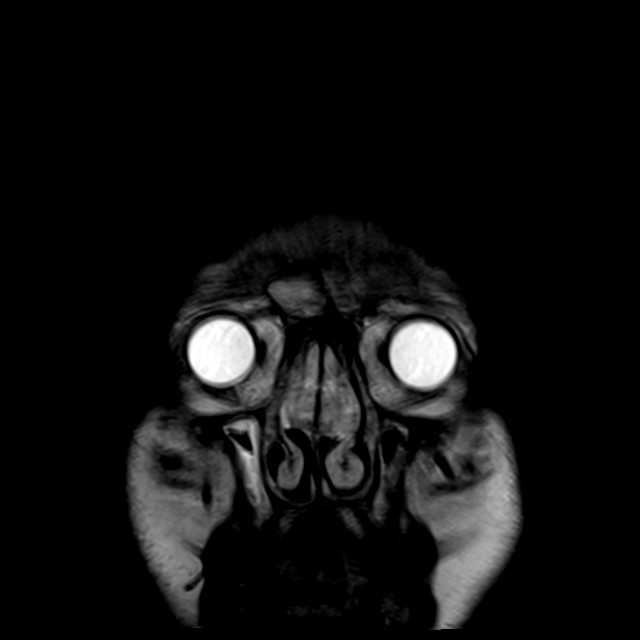
[im 31/31]
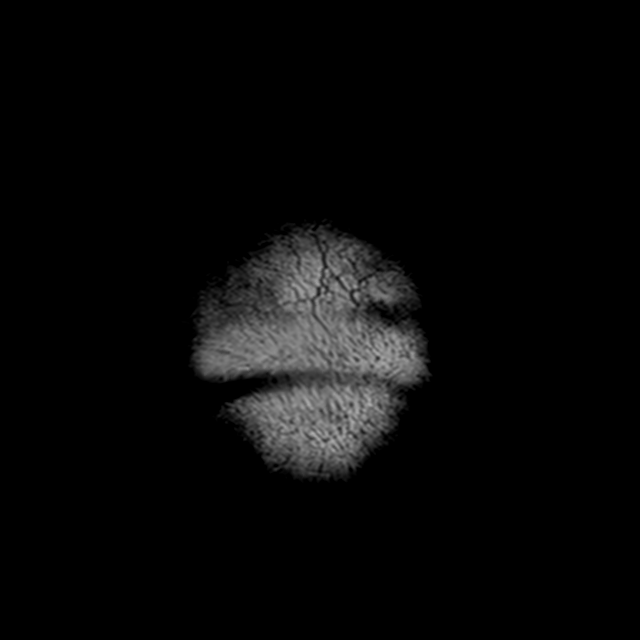

[Series 18: FLAIR · axial · 5.0mm · 0.90mm/px · z∈[-118,+44]mm · 2 of 28 slices shown (2 of 2)]
[im 1/28]
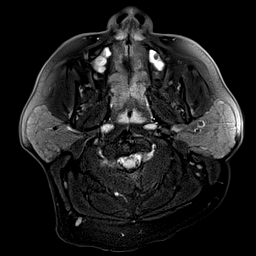
[im 28/28]
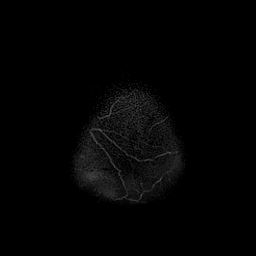

[35 of 48 positions shown; findings below may reference images not displayed]

FINDINGS: Brain: Slightly increased size and conspicuity of restricted
diffusion tracking from the body of the right caudate nucleus into
the lentiform. But no significant extension into surrounding tissue
from 3 days ago. Compare series 7, image 54 today to series 3, image
22 previously. Associated T2 and FLAIR hyperintense cytotoxic edema.
No hemorrhage. No mass effect.

No other restricted diffusion. No midline shift, mass effect,
evidence of mass lesion, ventriculomegaly, extra-axial collection or
acute intracranial hemorrhage. Cervicomedullary junction and
pituitary are within normal limits. Stable gray and white matter
signal elsewhere. No chronic cortical encephalomalacia or chronic
cerebral blood products identified.

Vascular: Major intracranial vascular flow voids are stable.

Skull and upper cervical spine: Stable.

Sinuses/Orbits: Mildly Disconjugate gaze, otherwise negative orbits.
Bilateral paranasal sinus disease is unchanged including
opacification and fluid levels.

Other: Right greater than left mastoid air cell and/or middle ear
fluid appears stable. Symmetric adenoid hypertrophy redemonstrated.
Other visible scalp and face soft tissues appear negative.
IMPRESSION: 1. Acute lacunar infarct of the right basal ganglia with mildly
increased diffusion restriction over the past three days. But no
significant regional extension. No associated hemorrhage or mass
effect.
2. No new intracranial abnormality.
3. Bilateral paranasal sinus, middle ear, and mastoid inflammation.

## 2021-05-25 IMAGING — DX DG CHEST 1V PORT
1 series · 1 of 1 positions shown · non-contrast
Comparison: [DATE]

CLINICAL DATA: LEFT-sided weakness

EXAM:
PORTABLE CHEST 1 VIEW

[chest ap]
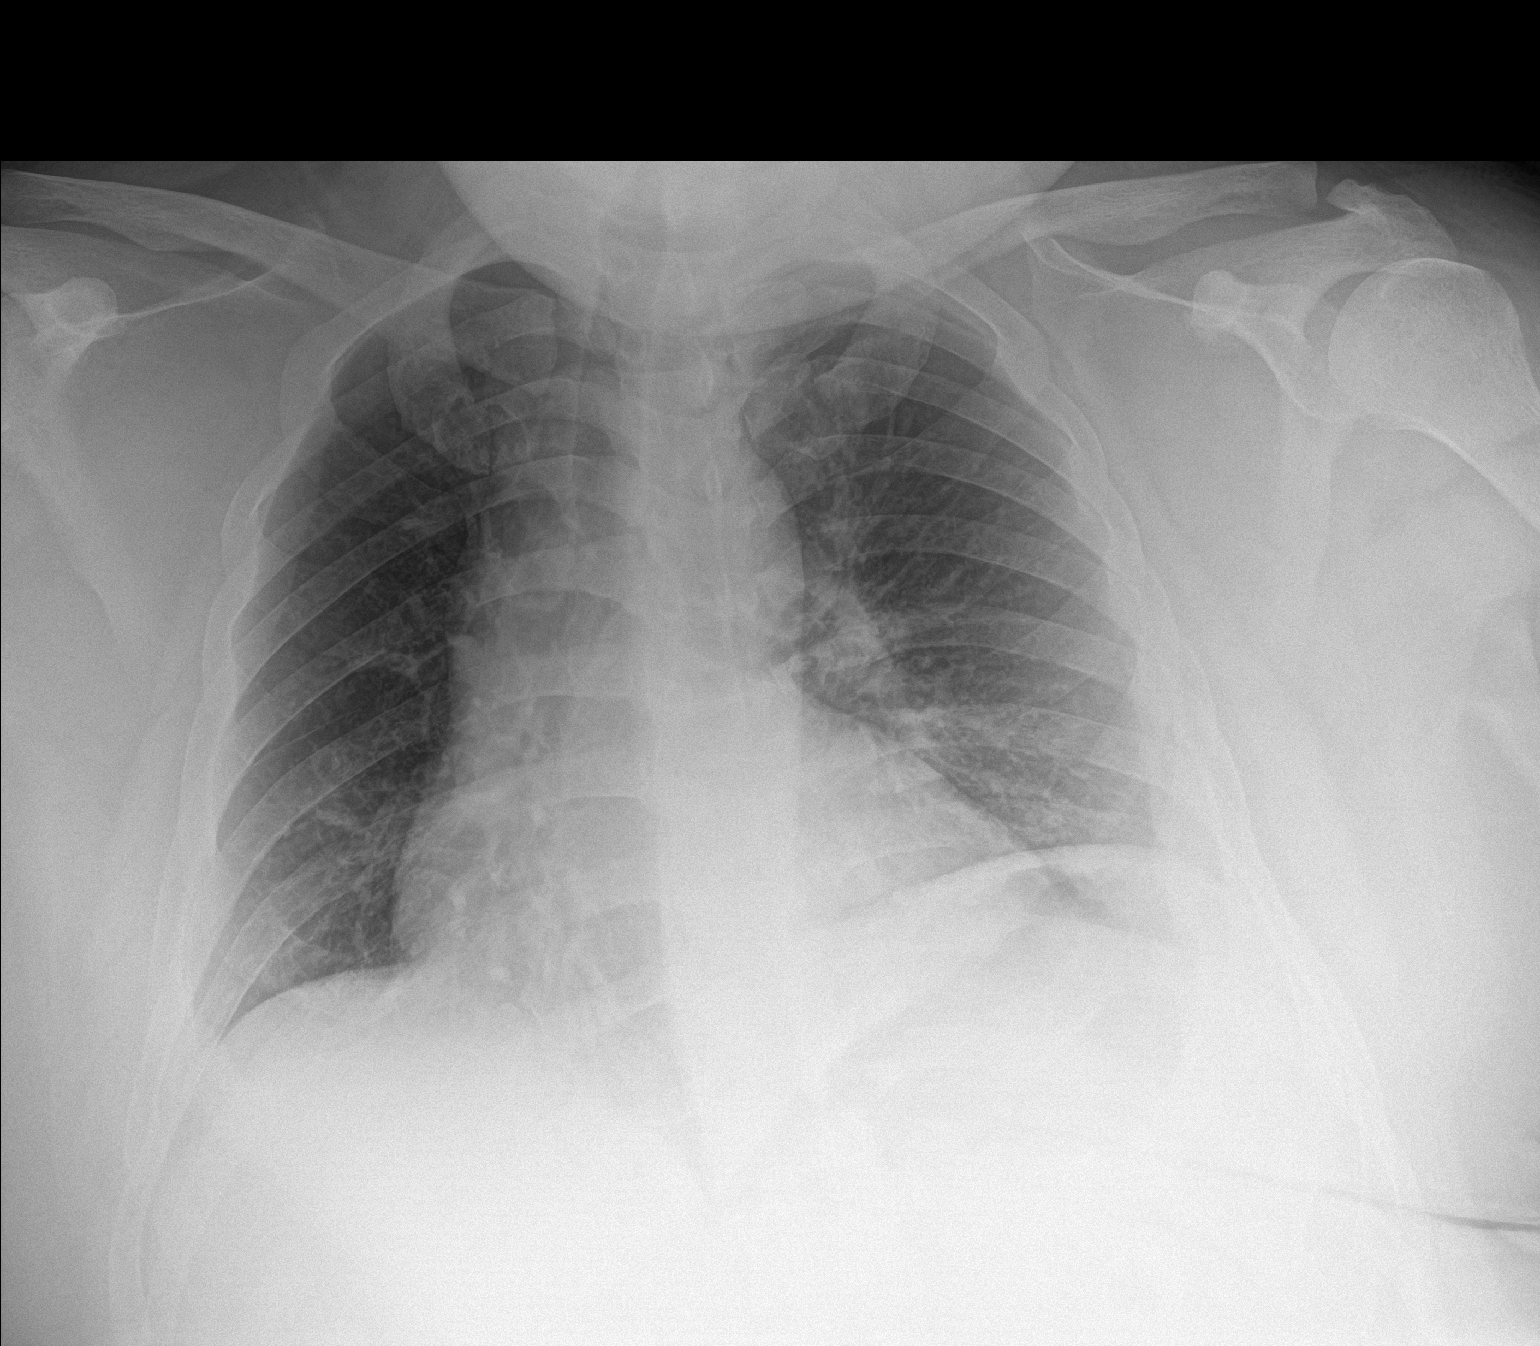

[1 of 1 positions shown; findings below may reference images not displayed]

FINDINGS: Evaluation is limited by patient rotation. The cardiomediastinal
silhouette is unchanged in contour. No pleural effusion. No
pneumothorax. No acute pleuroparenchymal abnormality. Visualized
abdomen is unremarkable.
IMPRESSION: No acute cardiopulmonary abnormality.

## 2021-05-25 MED ORDER — LOPERAMIDE HCL 2 MG PO CAPS
2.0000 mg | ORAL_CAPSULE | ORAL | Status: DC | PRN
  Administered 2021-05-25 – 2021-05-26 (×3): 2 mg via ORAL
  Filled 2021-05-25 (×3): qty 1

## 2021-05-25 MED ORDER — INFLUENZA VAC SPLIT QUAD 0.5 ML IM SUSY
0.5000 mL | PREFILLED_SYRINGE | INTRAMUSCULAR | Status: AC
  Administered 2021-05-26: 0.5 mL via INTRAMUSCULAR
  Filled 2021-05-25: qty 0.5

## 2021-05-25 MED ORDER — ENOXAPARIN SODIUM 40 MG/0.4ML IJ SOSY
40.0000 mg | PREFILLED_SYRINGE | INTRAMUSCULAR | Status: DC
  Administered 2021-05-25 – 2021-05-28 (×4): 40 mg via SUBCUTANEOUS
  Filled 2021-05-25 (×4): qty 0.4

## 2021-05-25 MED ORDER — SODIUM CHLORIDE 0.9 % IV SOLN: 250.0000 mL | INTRAVENOUS | Status: DC | PRN

## 2021-05-25 MED ORDER — ASPIRIN EC 325 MG PO TBEC
325.0000 mg | DELAYED_RELEASE_TABLET | Freq: Every day | ORAL | Status: DC
  Administered 2021-05-25 – 2021-05-28 (×4): 325 mg via ORAL
  Filled 2021-05-25 (×4): qty 1

## 2021-05-25 MED ORDER — ACETAMINOPHEN 650 MG RE SUPP: 650.0000 mg | Freq: Four times a day (QID) | RECTAL | Status: DC | PRN

## 2021-05-25 MED ORDER — CLOPIDOGREL BISULFATE 75 MG PO TABS
75.0000 mg | ORAL_TABLET | Freq: Every day | ORAL | Status: DC
  Administered 2021-05-25 – 2021-05-28 (×4): 75 mg via ORAL
  Filled 2021-05-25 (×4): qty 1

## 2021-05-25 MED ORDER — SODIUM CHLORIDE 0.9% FLUSH
3.0000 mL | Freq: Two times a day (BID) | INTRAVENOUS | Status: DC
  Administered 2021-05-25 – 2021-05-27 (×4): 3 mL via INTRAVENOUS

## 2021-05-25 MED ORDER — ACETAMINOPHEN 325 MG PO TABS: 650.0000 mg | ORAL_TABLET | Freq: Four times a day (QID) | ORAL | Status: DC | PRN

## 2021-05-25 MED ORDER — ATORVASTATIN CALCIUM 80 MG PO TABS
80.0000 mg | ORAL_TABLET | Freq: Every day | ORAL | Status: DC
  Administered 2021-05-25 – 2021-05-28 (×4): 80 mg via ORAL
  Filled 2021-05-25: qty 2
  Filled 2021-05-25 (×3): qty 1

## 2021-05-25 MED ORDER — SODIUM CHLORIDE 0.9 % IV SOLN: INTRAVENOUS | Status: DC

## 2021-05-25 MED ORDER — SODIUM CHLORIDE 0.9% FLUSH: 3.0000 mL | INTRAVENOUS | Status: DC | PRN

## 2021-05-25 NOTE — Progress Notes (Signed)
TCD with bubbles and bilateral lower extremity venous duplex has been completed. Preliminary results can be found in CV Proc through chart review.   05/25/21 1:22 PM Olen Cordial RVT

## 2021-05-25 NOTE — H&P (Signed)
History and Physical    Ernest [name] ZOX:[id number] DOB: 1985/10/07 DOA: 05/24/2021  PCP: Patient, No Pcp Per (Inactive)   Patient coming from: Home  Chief Complaint: slurred speech, confusion, numbness of left hand  HPI: Ernest [name] is a 35 y.o. 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  Patient was admitted from October 20 to October 21 after he had a stroke.  He had an echo cardiogram at that time an MRI showed CVA.  He was discharged on the 21st and was feeling good at that point.  After returning home he had numbness in his left hand and developed difficulty speaking with confusion and a drooping left side of face.  When the symptoms did not improve his wife brought him back to the emergency room.  He did have numbness in his left leg that waxed and waned over a few hours and then resolved.  He then developed numbness in his left hand which continues.  Wife reports that he had difficulty speaking and the family could not understand what he was saying.  At this time his speech is not back to normal he reports.  His wife reports that the symptoms have been intermittent and occurring in episodes.  He has not had any fevers, chills, cough, chest pain, palpitations, shortness of breath.  He had COVID-vaccine x2 over a year ago.  He denies any recent illness and no recent travel or trauma. He has a history of drinking alcohol but quit a number of years ago.  He has a history of smoking but states he has not smoked in the last 4 to 5 days and is going to quit completely.  Denies illicit drug use  ED Course: Ernest [name] has been medically stable with mildly elevated blood pressure in the emergency room.  He is found to have a leukocytosis on labs which is chronic in reviewing his previous labs.  Echocardiogram report from October  21 is reviewed.  CT of the head showed right basal ganglia and right corona radiata subacute infarction.  No acute intracranial hemorrhage.  Lab work revealed sodium 135 potassium 3.6 chloride 104 bicarb 23 BUN 10 creatinine 0.98 calcium 8.7 AST 46 ALT 64 alkaline phosphatase 88 bilirubin 0.8 WBC 15,100 hemoglobin 14.6 hematocrit 43.9 platelets 313,000, INR 1.0.  Neurology has been consulted and Dr. Thomasena Edis will see patient.  Hospitalist service been asked to evaluate and manage patient  Review of Systems:  General: Denies weakness, fever, chills, weight loss, night sweats.  Denies change in appetite HENT: Denies head trauma, denies change in hearing, tinnitus. Denies nasal bleeding. Denies sore throat, Denies difficulty swallowing Eyes: Denies blurry vision, pain in eye, drainage.  Denies discoloration of eyes. Neck: Denies pain.  Denies swelling.  Denies pain with movement. Cardiovascular: Denies chest pain, palpitations.  Denies edema.  Denies orthopnea Respiratory: Denies shortness of breath, cough.  Denies wheezing.  Denies sputum production Gastrointestinal: Denies abdominal pain, swelling.  Denies nausea, vomiting, diarrhea.  Denies melena.  Denies hematemesis. Musculoskeletal: Denies limitation of movement.  Denies deformity or swelling.  Denies pain.  Denies arthralgias or myalgias. Genitourinary: Denies pelvic pain.  Denies urinary frequency or hesitancy.  Denies dysuria.  Skin: Denies rash.  Denies petechiae, purpura, ecchymosis. Neurological: Denies syncope.  Denies seizure activity. Reports slurred speech, drooping face.  Denies visual change. Psychiatric: Denies depression,  anxiety. Denies hallucinations.  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HCC)     Past Surgical History:  Procedure Laterality Date   DECOMPRESSION FASCIOTOMY FOREARM Right     Social History  reports that he has been smoking. He has been smoking an average of .01 packs  per day. He has never used smokeless tobacco. He reports that he does not drink alcohol and does not use drugs.  No Known Allergies  Family History  Problem Relation Age of Onset   Diabetes Neg Hx    Cancer Neg Hx    Heart failure Neg Hx    Hyperlipidemia Neg Hx    Hypertension Neg Hx      Prior to Admission medications   Medication Sig Start Date End Date Taking? Authorizing Provider  acetaminophen (TYLENOL) 500 MG tablet Take 1,000 mg by mouth every 6 (six) hours as needed for pain.    [provider]  aspirin 325 MG EC tablet Take 1 tablet (325 mg total) by mouth daily. 05/23/21   Marinda Elk, MD  atorvastatin (LIPITOR) 80 MG tablet Take 1 tablet (80 mg total) by mouth daily. 05/23/21   Marinda Elk, MD  clopidogrel (PLAVIX) 75 MG tablet Take 1 tablet (75 mg total) by mouth daily. 05/23/21 08/21/21  Marinda Elk, MD    Physical Exam: Vitals:   05/24/21 2010 05/24/21 2030 05/24/21 2230 05/24/21 2330  BP: (!) 133/93 (!) 132/92 (!) 133/93 (!) 134/98  Pulse: 69 73 71 68  Resp: 19 14 19  (!) 22  Temp:      SpO2: 97% 100% 96% 96%    Constitutional: NAD, calm, comfortable Vitals:   05/24/21 2010 05/24/21 2030 05/24/21 2230 05/24/21 2330  BP: (!) 133/93 (!) 132/92 (!) 133/93 (!) 134/98  Pulse: 69 73 71 68  Resp: 19 14 19  (!) 22  Temp:      SpO2: 97% 100% 96% 96%   General: WDWN, Alert and oriented x3. Seen with aide of interpretor.  Eyes: EOMI, PERRL, conjunctivae normal.  Sclera nonicteric HENT:  [place]/AT, external ears normal.  Nares patent without epistasis.  Mucous membranes are moist. Posterior pharynx clear  Neck: Soft, normal range of motion, supple, no masses, no thyromegaly.  Trachea midline Respiratory: clear to auscultation bilaterally, no wheezing, no crackles. Normal respiratory effort. No accessory muscle use.  Cardiovascular: Regular rate and rhythm, no murmurs / rubs / gallops. No extremity edema. 2+ pedal pulses. Abdomen: Soft,  no tenderness, nondistended, no rebound or guarding. No masses palpated. Morbidly obese. Bowel sounds normoactive Musculoskeletal: FROM. no cyanosis. No joint deformity upper and lower extremities. Has scarring of right ventral forearm. Normal muscle tone.  Skin: Warm, dry, intact no rashes, lesions, ulcers. No induration Neurologic: CN 2-12 grossly intact. Normal speech. Sensation decreased in left hand, patella DTR +1 bilaterally. Strength 5/5 in all extremities. No tremor.  Psychiatric: Normal judgment and insight. Normal mood.    Labs on Admission: I have personally reviewed following labs and imaging studies  CBC: Recent Labs  Lab 05/22/21 0240 05/22/21 0246 05/23/21 0150 05/24/21 1848  WBC 11.7*  --  11.1* 15.1*  NEUTROABS 5.7  --  5.5 8.8*  HGB 14.7 15.3 14.1 14.6  HCT 43.8 45.0 41.9 43.9  MCV 85.5  --  85.9 85.6  PLT 289  --  287 313    Basic Metabolic Panel: Recent Labs  Lab 05/22/21 0240 05/22/21 0246 05/23/21 0150 05/24/21 1848  NA  135 139 136 135  K 3.8 3.7 3.7 3.6  CL 104 104 105 104  CO2 23  --  25 23  GLUCOSE 109* 107* 93 108*  BUN 8 7 9 10   CREATININE 0.98 0.90 1.00 0.98  CALCIUM 8.7*  --  8.6* 8.7*    GFR: Estimated Creatinine Clearance: 136.6 mL/min (by C-G formula based on SCr of 0.98 mg/dL).  Liver Function Tests: Recent Labs  Lab 05/22/21 0240 05/23/21 0150 05/24/21 1848  AST 53* 45* 46*  ALT 73* 69* 64*  ALKPHOS 92 81 88  BILITOT 0.7 1.2 0.8  PROT 7.3 6.7 7.6  ALBUMIN 3.4* 3.2* 3.5    Urine analysis:    Component Value Date/Time   COLORURINE YELLOW 08/07/2017 1150   APPEARANCEUR CLEAR 08/07/2017 1150   LABSPEC 1.025 08/07/2017 1150   PHURINE 6.0 08/07/2017 1150   GLUCOSEU NEGATIVE 08/07/2017 1150   HGBUR MODERATE (A) 08/07/2017 1150   BILIRUBINUR NEGATIVE 08/07/2017 1150   KETONESUR NEGATIVE 08/07/2017 1150   PROTEINUR 100 (A) 08/07/2017 1150   NITRITE NEGATIVE 08/07/2017 1150   LEUKOCYTESUR NEGATIVE 08/07/2017 1150     Radiological Exams on Admission: CT HEAD WO CONTRAST  Result Date: 05/24/2021 CLINICAL DATA:  MRI head 05/22/2021, CT head 05/22/2021 EXAM: CT HEAD WITHOUT CONTRAST TECHNIQUE: Contiguous axial images were obtained from the base of the skull through the vertex without intravenous contrast. COMPARISON:  CT head 05/22/2021, MR head 05/22/2021 FINDINGS: Brain: Subacute appearing right basal ganglia and corona radiata infarction. No evidence of large-territorial acute infarction. No parenchymal hemorrhage. No mass lesion. No extra-axial collection. No mass effect or midline shift. No hydrocephalus. Basilar cisterns are patent. Vascular: No hyperdense vessel. Skull: No acute fracture or focal lesion. Sinuses/Orbits: Bilateral maxillary, sphenoid, ethmoid, frontal mucosal thickening. Right mastoid air cell destruction and effusion with fluid noted within the right middle ear. The orbits are unremarkable. Other: None. IMPRESSION: 1. Subacute appearing right basal ganglia and corona radiata infarction. 2. Pansinusitis as well as right mastoid air cell effusion and fluid within the right middle ear. Correlate with infection. Electronically Signed   By: 05/24/2021 M.D.   On: 05/24/2021 20:10   ECHOCARDIOGRAM COMPLETE  Result Date: 05/23/2021    ECHOCARDIOGRAM REPORT   Patient Name:   Ernest [name] Date of Exam: 05/23/2021 Medical Rec #:  05/25/2021      Height:       63.0 in Accession #:    [id number]     Weight:       313.1 lb Date of Birth:  1986-03-13      BSA:          2.339 m Patient Age:    35 years       BP:           124/91 mmHg Patient Gender: M              HR:           75 bpm. Exam Location:  Inpatient Procedure: 2D Echo Indications:    TIA  History:        Patient has no prior history of Echocardiogram examinations.                 Risk Factors:Sleep Apnea, Current Smoker and Dyslipidemia.  Sonographer:    01/21/1987 RDCS Referring Phys: [phone] RONDELL A SMITH IMPRESSIONS  1. Left  ventricular ejection fraction, by estimation, is 60 to 65%. The left ventricle has normal function. The left ventricle has  no regional wall motion abnormalities. There is mild left ventricular hypertrophy. Left ventricular diastolic parameters were normal.  2. Right ventricular systolic function is normal. The right ventricular size is normal.  3. The mitral valve is normal in structure. No evidence of mitral valve regurgitation. No evidence of mitral stenosis.  4. The aortic valve has an indeterminant number of cusps. Aortic valve regurgitation is not visualized. No aortic stenosis is present.  5. The inferior vena cava is normal in size with greater than 50% respiratory variability, suggesting right atrial pressure of 3 mmHg. Comparison(s): No prior Echocardiogram. FINDINGS  Left Ventricle: Left ventricular ejection fraction, by estimation, is 60 to 65%. The left ventricle has normal function. The left ventricle has no regional wall motion abnormalities. The left ventricular internal cavity size was normal in size. There is  mild left ventricular hypertrophy. Left ventricular diastolic parameters were normal. Right Ventricle: The right ventricular size is normal. Right ventricular systolic function is normal. Left Atrium: Left atrial size was normal in size. Right Atrium: Right atrial size was normal in size. Pericardium: There is no evidence of pericardial effusion. Mitral Valve: The mitral valve is normal in structure. No evidence of mitral valve regurgitation. No evidence of mitral valve stenosis. Tricuspid Valve: The tricuspid valve is normal in structure. Tricuspid valve regurgitation is trivial. No evidence of tricuspid stenosis. Aortic Valve: The aortic valve has an indeterminant number of cusps. Aortic valve regurgitation is not visualized. No aortic stenosis is present. Aortic valve mean gradient measures 4.0 mmHg. Aortic valve peak gradient measures 7.5 mmHg. Aortic valve area, by VTI measures 2.51 cm.  Pulmonic Valve: The pulmonic valve was not well visualized. Pulmonic valve regurgitation is not visualized. No evidence of pulmonic stenosis. Aorta: The aortic root is normal in size and structure. Venous: The inferior vena cava is normal in size with greater than 50% respiratory variability, suggesting right atrial pressure of 3 mmHg. IAS/Shunts: No atrial level shunt detected by color flow Doppler.  LEFT VENTRICLE PLAX 2D LVIDd:         4.10 cm   Diastology LVIDs:         3.00 cm   LV e' medial:    8.27 cm/s LV PW:         1.10 cm   LV E/e' medial:  10.6 LV IVS:        1.20 cm   LV e' lateral:   13.70 cm/s LVOT diam:     1.90 cm   LV E/e' lateral: 6.4 LV SV:         64 LV SV Index:   28 LVOT Area:     2.84 cm  RIGHT VENTRICLE             IVC RV Basal diam:  3.70 cm     IVC diam: 1.60 cm RV S prime:     13.80 cm/s TAPSE (M-mode): 1.5 cm LEFT ATRIUM             Index        RIGHT ATRIUM           Index LA diam:        3.40 cm 1.45 cm/m   RA Area:     22.50 cm LA Vol (A2C):   36.9 ml 15.77 ml/m  RA Volume:   75.90 ml  32.45 ml/m LA Vol (A4C):   44.0 ml 18.81 ml/m LA Biplane Vol: 42.4 ml 18.13 ml/m  AORTIC VALVE AV Area (Vmax):  2.65 cm AV Area (Vmean):   2.46 cm AV Area (VTI):     2.51 cm AV Vmax:           137.00 cm/s AV Vmean:          90.200 cm/s AV VTI:            0.256 m AV Peak Grad:      7.5 mmHg AV Mean Grad:      4.0 mmHg LVOT Vmax:         128.00 cm/s LVOT Vmean:        78.400 cm/s LVOT VTI:          0.227 m LVOT/AV VTI ratio: 0.89  AORTA Ao Root diam: 2.90 cm Ao Asc diam:  2.90 cm MITRAL VALVE MV Area (PHT): 3.50 cm    SHUNTS MV Decel Time: 217 msec    Systemic VTI:  0.23 m MV E velocity: 87.40 cm/s  Systemic Diam: 1.90 cm MV A velocity: 69.40 cm/s MV E/A ratio:  1.26 Olga Millers MD Electronically signed by Olga Millers MD Signature Date/Time: 05/23/2021/10:59:21 AM    Final     EKG: Independently reviewed.  EKG shows normal sinus rhythm with no acute ST elevation or depression.  QTc  419.  No change from EKG done last week  Assessment/Plan Principal Problem:   Seizure  Ernest [name] is placed on Medical Telemetry floor for observation with possible seizure vs worsening CVA symptoms.  Obtain MRI of brain to reevaluate previous CVA to make sure is not enlarging/expanding. Neurology has been consulted, Dr. Thomasena Edis, and will await his recommendations on the EEG needs to be performed Patient had echocardiogram last weeks will not repeat at this time. Continue Lipitor 80 mg p.o. daily Monitor blood pressure and will start therapy to lower after 24 hours of permissive hypertension  Active Problems:   Paresthesia Left hand numbness per patient.    CVA (cerebral vascular accident)  Continue DAPT that he was started on last week after CVA.  MRI brain pending    Leukocytosis Chronic.  Recheck CBC in morning    Hypocalcemia Chronic.  Check electrolytes and renal function with labs in morning    Obesity, Class III, BMI 40-49.9 (morbid obesity) Follow-up with primary physician for lifestyle, dietary, pharmacotherapy weight loss strategies     DVT prophylaxis: Padua score low, early ambulation for DVT prophylaxis.   Code Status:   Full Code  Family Communication:  Diagnosis and plan discussed with patient and his wife who is at bedside with aid of interpreter.  Questions answered.  They agree with plan.  Further recommendations to follow as clinical indicated Disposition Plan:   Patient is from:  Home  Anticipated DC to:  Home  Anticipated DC date:  Anticipate less than 2 midnight stay  Consults called:  Neurology, Dr. Thomasena Edis  Admission status:  Observation   Ernest Severance Gracen Southwell MD [facility]  How to contact the [facility] Attending or Consulting provider 7A - 7P or covering provider during after hours 7P -7A, for this patient?   Check the care team in [facility] and look for a) attending/consulting [facility] provider listed and b) the [name] [place] team listed Log into [email or web address]  and use [place]'s universal password to access. If you do not have the password, please contact the [name] operator. Locate the [name] provider you are looking for under [facility] and page to a number that you can be directly reached. If you still have difficulty reaching the  provider, please page the [facility] (Director on Call) for the Hospitalists listed on amion for assistance.  05/25/2021, 1:51 AM

## 2021-05-25 NOTE — ED Notes (Signed)
Patient transported to MRI 

## 2021-05-25 NOTE — Progress Notes (Addendum)
STROKE TEAM PROGRESS NOTE   INTERVAL HISTORY His wife is at the bedside.  He is lying in bed in NAD. Was recently admitted for stroke and discharged on 10/21 came back for worsening left sided facial droop, confusion and slurred speech. States that he is still having worsening symptoms. Wife also states he has been having diarrhea lately, will start NS. TCD with bubble study negative for PFO. CXR with no acute process. Will check lower extremities [place] today and order TTE. Will check routine EEG.  Repeat MRI does not show significant extension of his infarct normally expected evolutionary changes. Vitals:   05/25/21 0830 05/25/21 1108 05/25/21 1130 05/25/21 1314  BP: 128/87 (!) 131/98 (!) 132/93   Pulse: 79 79 82 74  Resp: 18 18 20    Temp:  98.4 F (36.9 C)    TempSrc:  Oral    SpO2: 95% 95% 98% 99%  Weight:      Height:       CBC:  Recent Labs  Lab 05/23/21 0150 05/24/21 1848 05/25/21 0500  WBC 11.1* 15.1* 15.1*  NEUTROABS 5.5 8.8*  --   HGB 14.1 14.6 14.4  HCT 41.9 43.9 43.4  MCV 85.9 85.6 85.4  PLT 287 313 294   Basic Metabolic Panel:  Recent Labs  Lab 05/24/21 1848 05/25/21 0500  NA 135 136  K 3.6 3.6  CL 104 105  CO2 23 22  GLUCOSE 108* 104*  BUN 10 10  CREATININE 0.98 1.02  CALCIUM 8.7* 8.9   Lipid Panel:  Recent Labs  Lab 05/22/21 0845  CHOL 186  TRIG 88  HDL 48  CHOLHDL 3.9  VLDL 18  LDLCALC 05/24/21*   HgbA1c:  Recent Labs  Lab 05/22/21 0845  HGBA1C 5.5   Urine Drug Screen:  Recent Labs  Lab 05/25/21 0307  LABOPIA NONE DETECTED  COCAINSCRNUR NONE DETECTED  LABBENZ NONE DETECTED  AMPHETMU NONE DETECTED  THCU NONE DETECTED  LABBARB NONE DETECTED    Alcohol Level  Recent Labs  Lab 05/24/21 1848  ETH <10    IMAGING past 24 hours CT HEAD WO CONTRAST  Result Date: 05/24/2021 CLINICAL DATA:  MRI head 05/22/2021, CT head 05/22/2021 EXAM: CT HEAD WITHOUT CONTRAST TECHNIQUE: Contiguous axial images were obtained from the base of the skull  through the vertex without intravenous contrast. COMPARISON:  CT head 05/22/2021, MR head 05/22/2021 FINDINGS: Brain: Subacute appearing right basal ganglia and corona radiata infarction. No evidence of large-territorial acute infarction. No parenchymal hemorrhage. No mass lesion. No extra-axial collection. No mass effect or midline shift. No hydrocephalus. Basilar cisterns are patent. Vascular: No hyperdense vessel. Skull: No acute fracture or focal lesion. Sinuses/Orbits: Bilateral maxillary, sphenoid, ethmoid, frontal mucosal thickening. Right mastoid air cell destruction and effusion with fluid noted within the right middle ear. The orbits are unremarkable. Other: None. IMPRESSION: 1. Subacute appearing right basal ganglia and corona radiata infarction. 2. Pansinusitis as well as right mastoid air cell effusion and fluid within the right middle ear. Correlate with infection. Electronically Signed   By: 05/24/2021 M.D.   On: 05/24/2021 20:10   MR BRAIN WO CONTRAST  Result Date: 05/25/2021 CLINICAL DATA:  35 year old male with neurologic deficit. Right basal ganglia lacunar infarct 3 days ago. EXAM: MRI HEAD WITHOUT CONTRAST TECHNIQUE: Multiplanar, multiecho pulse sequences of the brain and surrounding structures were obtained without intravenous contrast. COMPARISON:  Brain MRI and intracranial MRA 05/22/2021. FINDINGS: Brain: Slightly increased size and conspicuity of restricted diffusion tracking from the body  of the right caudate nucleus into the lentiform. But no significant extension into surrounding tissue from 3 days ago. Compare series 7, image 54 today to series 3, image 22 previously. Associated T2 and FLAIR hyperintense cytotoxic edema. No hemorrhage. No mass effect. No other restricted diffusion. No midline shift, mass effect, evidence of mass lesion, ventriculomegaly, extra-axial collection or acute intracranial hemorrhage. Cervicomedullary junction and pituitary are within normal limits.  Stable gray and white matter signal elsewhere. No chronic cortical encephalomalacia or chronic cerebral blood products identified. Vascular: Major intracranial vascular flow voids are stable. Skull and upper cervical spine: Stable. Sinuses/Orbits: Mildly Disconjugate gaze, otherwise negative orbits. Bilateral paranasal sinus disease is unchanged including opacification and fluid levels. Other: Right greater than left mastoid air cell and/or middle ear fluid appears stable. Symmetric adenoid hypertrophy redemonstrated. Other visible scalp and face soft tissues appear negative. IMPRESSION: 1. Acute lacunar infarct of the right basal ganglia with mildly increased diffusion restriction over the past three days. But no significant regional extension. No associated hemorrhage or mass effect. 2. No new intracranial abnormality. 3. Bilateral paranasal sinus, middle ear, and mastoid inflammation. Electronically Signed   By: Odessa Fleming M.D.   On: 05/25/2021 04:32   DG CHEST PORT 1 VIEW  Result Date: 05/25/2021 CLINICAL DATA:  LEFT-sided weakness EXAM: PORTABLE CHEST 1 VIEW COMPARISON:  May 22, 2021 FINDINGS: Evaluation is limited by patient rotation. The cardiomediastinal silhouette is unchanged in contour. No pleural effusion. No pneumothorax. No acute pleuroparenchymal abnormality. Visualized abdomen is unremarkable. IMPRESSION: No acute cardiopulmonary abnormality. Electronically Signed   By: Meda Klinefelter M.D.   On: 05/25/2021 09:44   VAS [place] TRANSCRANIAL DOPPLER W BUBBLES  Result Date: 05/25/2021  Transcranial Doppler with Bubble Patient Name:  [facility]  Date of Exam:   05/25/2021 Medical Rec #: [id number]       Accession #:    [id number] Date of Birth: 1986/04/23       Patient Gender: M Patient Age:   35 years Exam Location:  [facility] Procedure:      VAS [place] TRANSCRANIAL DOPPLER W BUBBLES Referring Phys: Gevena Mart  --------------------------------------------------------------------------------  Indications: Stroke. Comparison Study: No prior studies. Performing Technologist: Chanda Busing RVT  Examination Guidelines: A complete evaluation includes B-mode imaging, spectral Doppler, color Doppler, and power Doppler as needed of all accessible portions of each vessel. Bilateral testing is considered an integral part of a complete examination. Limited examinations for reoccurring indications may be performed as noted.  Summary:  A vascular evaluation was performed. The right middle cerebral artery was studied. An IV was inserted into the patient's left Cephalic vein. Verbal informed consent was obtained.  Negative for PFO. *See table(s) above for TCD measurements and observations.    Preliminary    VAS [place] LOWER EXTREMITY VENOUS (DVT)  Result Date: 05/25/2021  Lower Venous DVT Study Patient Name:  [facility]  Date of Exam:   05/25/2021 Medical Rec #: [id number]       Accession #:    [id number] Date of Birth: [birth year]       Patient Gender: M Patient Age:   29 years Exam Location:  [facility] Procedure:      VAS [place] LOWER EXTREMITY VENOUS (DVT) Referring Phys: Gevena Mart --------------------------------------------------------------------------------  Indications: Stroke.  Risk Factors: None identified. Limitations: Poor ultrasound/tissue interface and body habitus. Comparison Study: No prior studies. Performing Technologist: Chanda Busing RVT  Examination Guidelines: A complete evaluation includes B-mode imaging, spectral Doppler, color Doppler, and power  Doppler as needed of all accessible portions of each vessel. Bilateral testing is considered an integral part of a complete examination. Limited examinations for reoccurring indications may be performed as noted. The reflux portion of the exam is performed with the patient in reverse Trendelenburg.   +---------+---------------+---------+-----------+----------+--------------+ RIGHT    CompressibilityPhasicitySpontaneityPropertiesThrombus Aging +---------+---------------+---------+-----------+----------+--------------+ CFV      Full           Yes      Yes                                 +---------+---------------+---------+-----------+----------+--------------+ SFJ      Full                                                        +---------+---------------+---------+-----------+----------+--------------+ FV Prox  Full                                                        +---------+---------------+---------+-----------+----------+--------------+ FV Mid   Full                                                        +---------+---------------+---------+-----------+----------+--------------+ FV DistalFull                                                        +---------+---------------+---------+-----------+----------+--------------+ PFV      Full                                                        +---------+---------------+---------+-----------+----------+--------------+ POP      Full           Yes      Yes                                 +---------+---------------+---------+-----------+----------+--------------+ PTV      Full                                                        +---------+---------------+---------+-----------+----------+--------------+ PERO     Full                                                        +---------+---------------+---------+-----------+----------+--------------+   +---------+---------------+---------+-----------+----------+--------------+  LEFT     CompressibilityPhasicitySpontaneityPropertiesThrombus Aging +---------+---------------+---------+-----------+----------+--------------+ CFV      Full           Yes      Yes                                  +---------+---------------+---------+-----------+----------+--------------+ SFJ      Full                                                        +---------+---------------+---------+-----------+----------+--------------+ FV Prox  Full                                                        +---------+---------------+---------+-----------+----------+--------------+ FV Mid   Full                                                        +---------+---------------+---------+-----------+----------+--------------+ FV DistalFull                                                        +---------+---------------+---------+-----------+----------+--------------+ PFV      Full                                                        +---------+---------------+---------+-----------+----------+--------------+ POP      Full           Yes      Yes                                 +---------+---------------+---------+-----------+----------+--------------+ PTV      Full                                                        +---------+---------------+---------+-----------+----------+--------------+ PERO     Full                                                        +---------+---------------+---------+-----------+----------+--------------+    Summary: RIGHT: - There is no evidence of deep vein thrombosis in the lower extremity. However, portions of this examination were limited- see technologist comments above.  - No cystic structure found in the popliteal fossa.  LEFT: - There is no evidence of  deep vein thrombosis in the lower extremity. However, portions of this examination were limited- see technologist comments above.  - No cystic structure found in the popliteal fossa.  *See table(s) above for measurements and observations.    Preliminary     PHYSICAL EXAM  Temp:  [98.4 F (36.9 C)-98.8 F (37.1 C)] 98.4 F (36.9 C) (10/23 1108) Pulse Rate:  [67-82] 74 (10/23  1314) Resp:  [14-22] 20 (10/23 1130) BP: (126-175)/(78-111) 132/93 (10/23 1130) SpO2:  [95 %-100 %] 99 % (10/23 1314) Weight:  [110.2 kg] 110.2 kg (10/23 0230)  General -obese [place] young male, in no apparent distress.  Ophthalmologic - fundi not visualized due to noncooperation.  Cardiovascular - Regular rhythm and rate.  Mental Status -  Level of arousal and orientation to time, place, and person were intact. Language including expression, naming, repetition, comprehension was assessed and found intact. Attention span and concentration were normal. Recent and remote memory were intact. Fund of Knowledge was assessed and was intact.  Cranial Nerves II - XII - II - Visual field intact OU. III, IV, VI - Extraocular movements intact. V - Facial sensation intact bilaterally. VII - Facial movement with moderate left facial droop VIII - Hearing & vestibular intact bilaterally. X - Palate elevates symmetrically. XI - Chin turning & shoulder shrug intact bilaterally. XII - Tongue protrusion intact.  Motor Strength - left arm weaker 4/5, proximally with significant weakness of left grip and intrinsic hand muscles noted. LLE, RUE, RLE 5/5  Bulk was normal and fasciculations were absent.   Motor Tone - Muscle tone was assessed at the neck and appendages and was normal.  Sensory - Light touch, temperature/pinprick were assessed and were symmetrical.    Coordination - The patient had normal movements in the hands and feet with no ataxia or dysmetria.  Tremor was absent.  Gait and Station - deferred.   ASSESSMENT/PLAN [name] is a 35 y.o. 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 Patient was admitted from October 20 to October 21 after he had a stroke.  He had an echo cardiogram at that time an MRI showed CVA.  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   CT head Subacute appearing right basal ganglia and corona radiata infarction. 2D Echo EF 60-65%. No atrial shunt Will order TEE [place] lower extremities pending  TCD with bubble study pending  Will check rEEG ordered UDS negative  LDL 120 HgbA1c 5.5 VTE prophylaxis - Lovenox and SCD's    Diet   Diet Heart Room service appropriate? Yes; Fluid consistency: Thin   aspirin 325 mg daily and clopidogrel 75 mg daily prior to admission, now on aspirin 325 mg daily and clopidogrel 75 mg daily.  Therapy recommendations:  pending  Disposition:  home  Hypertension Home meds:  none Stable Permissive hypertension (OK if < 220/120) but gradually normalize in 5-7 days Long-term BP goal normotensive  Hyperlipidemia Home meds:  atorvastatin 80, resumed in hospital LDL 120, goal < 70 Continue statin at discharge  Tobacco abuse Current smoker Smoking cessation counseling provided Pt is willing to quit  Other Stroke Risk Factors Cigarette smoker advised to stop smoking Obesity, Body  mass index is 43.05 kg/m., BMI >/= 30 associated with increased stroke risk, recommend weight loss, diet and exercise as appropriate  Hx stroke/TIA Likely Obstructive sleep apnea, will need outpatient sleep study   Hospital day # 0  Gevena Mart, NP   STROKE MD NOTE : I have personally obtained history,examined this patient, reviewed notes, independently viewed imaging studies, participated in medical decision making and plan of care.ROS completed by me personally and pertinent positives fully documented  I have made any additions or clarifications directly to the above note. Agree with note above.  Patient recently admitted with left face and arm weakness secondary to large subcortical  right brain infarct etiology likely cryptogenic even though the MRA shows distal M1 stenosis versus prion with area of the infarct and likely represents proximal embolus which is recanalized and moved distally.  Repeat MRI does not show significant change in infarct size only expected evolutionary changes.  Check TCD bubble study for PFO and TEE for cardiac source of embolism.  Lower extremity venous Dopplers for DVT.  Anticardiolipin antibodies and ANA.  Outpatient sleep study for obstructive sleep apnea.  Continue dual antiplatelet therapy and aggressive risk factor modification.  Long discussion with patient and wife at the bedside and answered questions.  Greater than 50% time during this 35-minute visit was spent  Delia Heady, MD Medical Director [facility] Pager: [phone] 05/25/2021 3:16 PM   To contact Stroke Continuity provider, please refer to [email or web address]. After hours, contact General Neurology

## 2021-05-25 NOTE — Progress Notes (Signed)
  PROGRESS NOTE  Patient admitted earlier this morning. See H&P.   Patient admitted with recurrent symptoms of slurred speech, confusion, numbness of the left hand and arm.  He was just admitted from 10/20-10/21 after diagnosis of a stroke.  He returned home, however continued to have some numbness in his left hand, associated with speech difficulties, confusion, drooping of the left side of his face.  He returned to the emergency department.  Patient is evaluated in the emergency department, on physical exam, he continues to exhibit left-sided facial droop.  He has strength 5 out of 5 all extremities, admits to numbness in his left hand going up to his left forearm.  Neurology consulted, await recommendation EEG pending  Continue DAPT, lipitor     Status is: Observation  The patient will require care spanning > 2 midnights and should be moved to inpatient because: stroke work up       Noralee Stain, DO [facility] 05/25/2021, 2:12 PM  Available via Epic secure chat 7am-7pm After these hours, please refer to coverage provider listed on [email or web address]

## 2021-05-25 NOTE — Consult Note (Addendum)
Neurology Consult H&P  Ernest [name] MR# [id number] 05/25/2021  CC: left sided face droop and slurred speech  History is obtained from: Interpreter and chart.  HPI: Ernest [name] is a 35 y.o. male PMHx as reviewed below just discharged after stroke workup on 05/23/2021 when he presented with slurred speech and weakness of left face, arm and leg and returns with episodic slurred speech and left face droop.  His family states when interpreter that upon discharge he was completely asymptomatic without any weakness in about 2000 he began to have left-sided weakness initially leg greater than arm and on evaluation in ED had slurred speech, left face and arm intermittent weakness.   The following information was taken from Dr. Warren Danes stroke note 05/22/2021:  "Etiology for patient stroke likely due to large vessel disease due to right M1 moderate to severe stenosis.  Patient has multiple risk factors including morbid obesity, smoker, hyperlipidemia.  Patient snoring at night per wife, high likelihood for OSA with morbid obesity, will refer to outpatient sleep study. However, RCVS from "monster" drinks is still in DDx. Recommend to quit "Monster" drinks and will repeat MRA in 2-3 months at neurology follow up.  Recommend aggressive stroke risk factor modification, quit smoking.  Continue aspirin 325 and Plavix 75 daily DAPT for 3 months due to intracranial stenosis, and then aspirin alone.  Continue high intensity Lipitor 80.  PT/OT no recon."   LKW: Unclear tNK given: No IR Thrombectomy No Modified Rankin Scale: 0-Completely asymptomatic and back to baseline post- stroke NIHSS: 1 mild left face  ROS: A complete ROS was performed and is negative except as noted in the HPI.   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HCC)      Family History  Problem Relation Age of Onset   Diabetes Neg Hx    Cancer Neg Hx    Heart failure Neg Hx    Hyperlipidemia Neg Hx     Hypertension Neg Hx     Social History:  reports that he has been smoking. He has been smoking an average of .01 packs per day. He has never used smokeless tobacco. He reports that he does not drink alcohol and does not use drugs.   Prior to Admission medications   Medication Sig Start Date End Date Taking? Authorizing Provider  acetaminophen (TYLENOL) 500 MG tablet Take 1,000 mg by mouth every 6 (six) hours as needed for pain.    [provider]  aspirin 325 MG EC tablet Take 1 tablet (325 mg total) by mouth daily. 05/23/21   Marinda Elk, MD  atorvastatin (LIPITOR) 80 MG tablet Take 1 tablet (80 mg total) by mouth daily. 05/23/21   Marinda Elk, MD  clopidogrel (PLAVIX) 75 MG tablet Take 1 tablet (75 mg total) by mouth daily. 05/23/21 08/21/21  Marinda Elk, MD    Exam: Current vital signs: BP (!) 134/98   Pulse 68   Temp 98.8 F (37.1 C)   Resp (!) 22   SpO2 96%   Physical Exam  Constitutional: Appears well-developed and well-nourished.  Psych: Affect appropriate to situation Eyes: No scleral injection HENT: No OP obstruction. Head: Normocephalic.  Cardiovascular: Normal rate and regular rhythm.  Respiratory: Effort normal, symmetric excursions bilaterally, no audible wheezing. GI: Soft.  No distension. There is no tenderness.  Skin: WDI  Neuro: Mental Status: Patient is awake, alert, oriented to person, place, month, year, and situation. Patient is able to give  a clear and coherent history. Speech fluent, intact comprehension and repetition. No signs of aphasia or neglect. Visual Fields are full. Pupils are equal, round, and reactive to light. EOMI without ptosis or diploplia.  Facial sensation is symmetric to temperature Facial mildly asymmetric on left.  Hearing is intact to voice. Uvula midline and palate elevates symmetrically. Shoulder shrug is symmetric. Tongue is midline without atrophy or fasciculations.  Tone is normal. Bulk  is normal. 5/5 strength was present in all four extremities. Sensation is symmetric to light touch and temperature in the arms and legs. Deep Tendon Reflexes: 2+ and symmetric in the biceps and patellae. Toes are downgoing bilaterally. FNF and HKS are intact bilaterally. Gait - Deferred  I have reviewed labs in epic and the pertinent results are: Ca 8.7  I have reviewed the images obtained: NCT head showed Subacute appearing right basal ganglia and corona radiata infarction.  Assessment: Ernest [name] is a 35 y.o. male PMHx as noted above recently discharged 05/23/2021 after right basal ganglia and corona radiata stroke.  Patient was discharged on aspirin and clopidogrel and reports no missed doses.  The duration of these episodes are not clear and there is a significant language barrier and will pursue routine EEG in the morning.  Previous MRA 05/22/2021 showed moderate stenosis in the distal M1 artery and since his symptoms had completely resolved on discharge, and have now recurred there is a possibility for extension of stroke.  Being that there is a language barrier the most prudent thing to do would be to observe on the stroke service and repeat MRI brain.   Plan: - MRI brain without contrast. - Recommend Statin if LDL > 70 -Continue aspirin 325 mg daily. -Continue clopidogrel 75mg  daily for 3 months. - SBP goal <180. - Telemetry monitoring for arrhythmia. - Recommend bedside Swallow screen. - Recommend Stroke education. - Recommend PT/OT/SLP consult. -Routine EEG ordered.  Electronically signed by:  , MD Page: Marisue Humble 05/25/2021, 12:24 AM

## 2021-05-26 ENCOUNTER — Observation Stay (HOSPITAL_COMMUNITY): Payer: 59

## 2021-05-26 DIAGNOSIS — R569 Unspecified convulsions: Secondary | ICD-10-CM | POA: Diagnosis not present

## 2021-05-26 LAB — CBC
HCT: 43.2 % (ref 39.0–52.0)
Hemoglobin: 14.5 g/dL (ref 13.0–17.0)
MCH: 28.8 pg (ref 26.0–34.0)
MCHC: 33.6 g/dL (ref 30.0–36.0)
MCV: 85.9 fL (ref 80.0–100.0)
Platelets: 305 10*3/uL (ref 150–400)
RBC: 5.03 MIL/uL (ref 4.22–5.81)
RDW: 13.5 % (ref 11.5–15.5)
WBC: 14.6 10*3/uL — ABNORMAL HIGH (ref 4.0–10.5)
nRBC: 0 % (ref 0.0–0.2)

## 2021-05-26 LAB — C DIFFICILE QUICK SCREEN W PCR REFLEX
C Diff antigen: NEGATIVE
C Diff interpretation: NOT DETECTED
C Diff toxin: NEGATIVE

## 2021-05-26 LAB — COMPREHENSIVE METABOLIC PANEL
ALT: 84 U/L — ABNORMAL HIGH (ref 0–44)
AST: 79 U/L — ABNORMAL HIGH (ref 15–41)
Albumin: 3.3 g/dL — ABNORMAL LOW (ref 3.5–5.0)
Alkaline Phosphatase: 81 U/L (ref 38–126)
Anion gap: 10 (ref 5–15)
BUN: 12 mg/dL (ref 6–20)
CO2: 20 mmol/L — ABNORMAL LOW (ref 22–32)
Calcium: 8.7 mg/dL — ABNORMAL LOW (ref 8.9–10.3)
Chloride: 105 mmol/L (ref 98–111)
Creatinine, Ser: 0.97 mg/dL (ref 0.61–1.24)
GFR, Estimated: >60 mL/min (ref >60)
Glucose, Bld: 93 mg/dL (ref 70–99)
Potassium: 3.8 mmol/L (ref 3.5–5.1)
Sodium: 135 mmol/L (ref 135–145)
Total Bilirubin: 1.1 mg/dL (ref 0.3–1.2)
Total Protein: 7.3 g/dL (ref 6.5–8.1)

## 2021-05-26 NOTE — Progress Notes (Addendum)
PROGRESS NOTE    Ernest [name]  ZOX:[id number] DOB: 1986/03/23 DOA: 05/24/2021 PCP: Patient, No Pcp Per (Inactive)     Brief Narrative:  Ernest [name] is a 35 y.o. 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  Patient was admitted from October 20 to October 21 after he had a stroke.  He had an echo cardiogram at that time and MRI showed CVA.  He was discharged on the 21st and was feeling good at that point.  After returning home he had numbness in his left hand and developed difficulty speaking with confusion and a drooping left side of face.  When the symptoms did not improve his wife brought him back to the emergency room.  He did have numbness in his left leg that waxed and waned over a few hours and then resolved.  He then developed numbness in his left hand which continues.  Wife reports that he had difficulty speaking and the family could not understand what he was saying.   CT of the head showed right basal ganglia and right corona radiata subacute infarction.  No acute intracranial hemorrhage. Neurology consulted.   New events last 24 hours / Subjective: Patient continues to have left-sided facial droop, speech difficulties as well as numbness of the left hand radiating proximally.  Symptoms remain about the same as yesterday.  Per report, had numerous episodes of diarrhea overnight.  C. difficile was negative.  Assessment & Plan:   Principal Problem:   Seizure (HCC) Active Problems:   CVA (cerebral vascular accident) (HCC)   Obesity, Class III, BMI 40-49.9 (morbid obesity) (HCC)   Leukocytosis   Hypocalcemia   Paresthesia   CVA -Readmissions for similar symptoms -MRI brain: Acute lacunar infarct of the right basal ganglia with mildly increased diffusion restriction over the past three days. But no  significant regional extension. No associated hemorrhage or mass effect. -Echocardiogram was completed 10/21  -EEG without seizure activity -TCD bubble study negative for PFO -Venous dopplers negative for DVT  -Anticardiolipin antibodies pending  -Continue aspirin, Plavix, lipitor  -TEE scheduled for 10/26 -Appreciate neurology. Follow up with Neurology in 2 months. Will also need outpatient sleep study   Elevated LFT -Unclear etiology -Hepatitis panel negative  -Right upper quadrant ultrasound pending  Diarrhea -C Diff negative     DVT prophylaxis:  enoxaparin (LOVENOX) injection 40 mg Start: 05/25/21 1415  Code Status: Full code Family Communication: Wife at bedside Disposition Plan:  Status is: Observation  The patient will require care spanning > 2 midnights and should be moved to inpatient because: Continued work-up including TEE, right upper quadrant ultrasound     Consultants:  Neurology   Antimicrobials:  Anti-infectives (From admission, onward)    None        Objective: Vitals:   05/25/21 2331 05/26/21 0419 05/26/21 0809 05/26/21 1259  BP: (!) 144/82 128/84 (!) 132/91 121/77  Pulse: 68 72  72  Resp: Temp: 98.4 F (36.9 C) 98 F (36.7 C) 98.4 F (36.9 C) 99.1 F (37.3 C)  TempSrc: Axillary Axillary Oral Oral  SpO2: 98% 100% 99% 97%  Weight:      Height:        Intake/Output Summary (Last 24 hours) at 05/26/2021 1317 Last data filed at 05/26/2021 0705 Gross per 24 hour  Intake 886.23 ml  Output --  Net 886.23 ml   Filed Weights   05/25/21 0230 05/25/21 1714  Weight: 110.2 kg 107.9 kg    Examination:  General exam: Appears calm and comfortable  Respiratory system: Clear to auscultation. Respiratory effort normal. No respiratory distress. No conversational dyspnea.  Cardiovascular system: S1 & S2 heard, RRR. No murmurs. No pedal edema. Gastrointestinal system: Abdomen is nondistended, soft and nontender. Normal bowel  sounds heard. Central nervous system: Alert and oriented. +left facial droop, dysarthria, strength 5/5 all extremities  Extremities: Symmetric in appearance  Skin: No rashes, lesions or ulcers on exposed skin  Psychiatry: Judgement and insight appear normal. Mood & affect appropriate.   Data Reviewed: I have personally reviewed following labs and imaging studies  CBC: Recent Labs  Lab 05/22/21 0240 05/22/21 0246 05/23/21 0150 05/24/21 1848 05/24/21 1855 05/25/21 0500 05/26/21 0111  WBC 11.7*  --  11.1* 15.1*  --  15.1* 14.6*  NEUTROABS 5.7  --  5.5 8.8*  --   --   --   HGB 14.7   < > 14.1 14.6 15.6 14.4 14.5  HCT 43.8   < > 41.9 43.9 46.0 43.4 43.2  MCV 85.5  --  85.9 85.6  --  85.4 85.9  PLT 289  --  287 313  --  294 305   < > = values in this interval not displayed.   Basic Metabolic Panel: Recent Labs  Lab 05/22/21 0240 05/22/21 0246 05/23/21 0150 05/24/21 1848 05/24/21 1855 05/25/21 0500 05/26/21 0111  NA 135   < > 136 135 139 136 135  K 3.8   < > 3.7 3.6 3.8 3.6 3.8  CL 104   < > 105 104 104 105 105  CO2 23  --  25 23  --  22 20*  GLUCOSE 109*   < > 93 108* 106* 104* 93  BUN 8   < > CREATININE 0.98   < > 1.00 0.98 0.90 1.02 0.97  CALCIUM 8.7*  --  8.6* 8.7*  --  8.9 8.7*   < > = values in this interval not displayed.   GFR: Estimated Creatinine Clearance: 117.3 mL/min (by C-G formula based on SCr of 0.97 mg/dL). Liver Function Tests: Recent Labs  Lab 05/22/21 0240 05/23/21 0150 05/24/21 1848 05/25/21 0500 05/26/21 0111  AST 53* 45* 46* 51* 79*  ALT 73* 69* 64* 68* 84*  ALKPHOS 92 81 88 84 81  BILITOT 0.7 1.2 0.8 1.0 1.1  PROT 7.3 6.7 7.6 7.4 7.3  ALBUMIN 3.4* 3.2* 3.5 3.5 3.3*   No results for input(s): LIPASE, AMYLASE in the last 168 hours. No results for input(s): AMMONIA in the last 168 hours. Coagulation Profile: Recent Labs  Lab 05/22/21 0240 05/24/21 1848  INR 1.0 1.0   Cardiac Enzymes: No results for input(s):  CKTOTAL, CKMB, CKMBINDEX, TROPONINI in the last 168 hours. BNP (last 3 results) No results for input(s): PROBNP in the last 8760 hours. HbA1C: No results for input(s): HGBA1C in the last 72 hours. CBG: Recent Labs  Lab 05/22/21 0305  GLUCAP 108*   Lipid Profile: No results for input(s): CHOL, HDL, LDLCALC, TRIG, CHOLHDL, LDLDIRECT in the last 72 hours. Thyroid Function Tests: No results for input(s): TSH, T4TOTAL, FREET4, T3FREE, THYROIDAB in the last 72 hours. Anemia Panel: No results for input(s): VITAMINB12, FOLATE, FERRITIN, TIBC, IRON, RETICCTPCT in the last 72 hours. Sepsis Labs: No results for input(s): PROCALCITON, LATICACIDVEN in the last  168 hours.  Recent Results (from the past 240 hour(s))  Resp Panel by RT-PCR (Flu A&B, Covid) Nasopharyngeal Swab     Status: None   Collection Time: 05/22/21  2:38 AM   Specimen: Nasopharyngeal Swab; Nasopharyngeal(NP) swabs in vial transport medium  Result Value Ref Range Status   SARS Coronavirus 2 by RT PCR NEGATIVE NEGATIVE Final    Comment: (NOTE) SARS-CoV-2 target nucleic acids are NOT DETECTED.  The SARS-CoV-2 RNA is generally detectable in upper respiratory specimens during the acute phase of infection. The lowest concentration of SARS-CoV-2 viral copies this assay can detect is 138 copies/mL. A negative result does not preclude SARS-Cov-2 infection and should not be used as the sole basis for treatment or other patient management decisions. A negative result may occur with  improper specimen collection/handling, submission of specimen other than nasopharyngeal swab, presence of viral mutation(s) within the areas targeted by this assay, and inadequate number of viral copies(<138 copies/mL). A negative result must be combined with clinical observations, patient history, and epidemiological information. The expected result is Negative.  Fact Sheet for Patients:  BloggerCourse.com  Fact Sheet for  Healthcare Providers:  SeriousBroker.it  This test is no t yet approved or cleared by the [place] FDA and  has been authorized for detection and/or diagnosis of SARS-CoV-2 by FDA under an Emergency Use Authorization (EUA). This EUA will remain  in effect (meaning this test can be used) for the duration of the COVID-19 declaration under Section 564(b)(1) of the Act, 21 U.S.C.section 360bbb-3(b)(1), unless the authorization is terminated  or revoked sooner.       Influenza A by PCR NEGATIVE NEGATIVE Final   Influenza B by PCR NEGATIVE NEGATIVE Final    Comment: (NOTE) The Xpert Xpress SARS-CoV-2/FLU/RSV plus assay is intended as an aid in the diagnosis of influenza from Nasopharyngeal swab specimens and should not be used as a sole basis for treatment. Nasal washings and aspirates are unacceptable for Xpert Xpress SARS-CoV-2/FLU/RSV testing.  Fact Sheet for Patients: BloggerCourse.com  Fact Sheet for Healthcare Providers: SeriousBroker.it  This test is not yet approved or cleared by the [place] FDA and has been authorized for detection and/or diagnosis of SARS-CoV-2 by FDA under an Emergency Use Authorization (EUA). This EUA will remain in effect (meaning this test can be used) for the duration of the COVID-19 declaration under Section 564(b)(1) of the Act, 21 U.S.C. section 360bbb-3(b)(1), unless the authorization is terminated or revoked.  Performed at [facility], [address] [place], [place] [address]   Resp Panel by RT-PCR (Flu A&B, Covid) Nasopharyngeal Swab     Status: None   Collection Time: 05/25/21  4:58 AM   Specimen: Nasopharyngeal Swab; Nasopharyngeal(NP) swabs in vial transport medium  Result Value Ref Range Status   SARS Coronavirus 2 by RT PCR NEGATIVE NEGATIVE Final    Comment: (NOTE) SARS-CoV-2 target nucleic acids are NOT DETECTED.  The SARS-CoV-2 RNA is  generally detectable in upper respiratory specimens during the acute phase of infection. The lowest concentration of SARS-CoV-2 viral copies this assay can detect is 138 copies/mL. A negative result does not preclude SARS-Cov-2 infection and should not be used as the sole basis for treatment or other patient management decisions. A negative result may occur with  improper specimen collection/handling, submission of specimen other than nasopharyngeal swab, presence of viral mutation(s) within the areas targeted by this assay, and inadequate number of viral copies(<138 copies/mL). A negative result must be combined with clinical observations,  patient history, and epidemiological information. The expected result is Negative.  Fact Sheet for Patients:  BloggerCourse.com  Fact Sheet for Healthcare Providers:  SeriousBroker.it  This test is no t yet approved or cleared by the [place] FDA and  has been authorized for detection and/or diagnosis of SARS-CoV-2 by FDA under an Emergency Use Authorization (EUA). This EUA will remain  in effect (meaning this test can be used) for the duration of the COVID-19 declaration under Section 564(b)(1) of the Act, 21 U.S.C.section 360bbb-3(b)(1), unless the authorization is terminated  or revoked sooner.       Influenza A by PCR NEGATIVE NEGATIVE Final   Influenza B by PCR NEGATIVE NEGATIVE Final    Comment: (NOTE) The Xpert Xpress SARS-CoV-2/FLU/RSV plus assay is intended as an aid in the diagnosis of influenza from Nasopharyngeal swab specimens and should not be used as a sole basis for treatment. Nasal washings and aspirates are unacceptable for Xpert Xpress SARS-CoV-2/FLU/RSV testing.  Fact Sheet for Patients: BloggerCourse.com  Fact Sheet for Healthcare Providers: SeriousBroker.it  This test is not yet approved or cleared by the [place] FDA and has been authorized for detection and/or diagnosis of SARS-CoV-2 by FDA under an Emergency Use Authorization (EUA). This EUA will remain in effect (meaning this test can be used) for the duration of the COVID-19 declaration under Section 564(b)(1) of the Act, 21 U.S.C. section 360bbb-3(b)(1), unless the authorization is terminated or revoked.  Performed at [facility], [address] [place], [place] [address]   C Difficile Quick Screen w PCR reflex     Status: None   Collection Time: 05/26/21  1:28 AM   Specimen: STOOL  Result Value Ref Range Status   C Diff antigen NEGATIVE NEGATIVE Final   C Diff toxin NEGATIVE NEGATIVE Final   C Diff interpretation No C. difficile detected.  Final    Comment: Performed at [facility], [address] [place], [place] [address]      Radiology Studies: CT HEAD WO CONTRAST  Result Date: 05/24/2021 CLINICAL DATA:  MRI head 05/22/2021, CT head 05/22/2021 EXAM: CT HEAD WITHOUT CONTRAST TECHNIQUE: Contiguous axial images were obtained from the base of the skull through the vertex without intravenous contrast. COMPARISON:  CT head 05/22/2021, MR head 05/22/2021 FINDINGS: Brain: Subacute appearing right basal ganglia and corona radiata infarction. No evidence of large-territorial acute infarction. No parenchymal hemorrhage. No mass lesion. No extra-axial collection. No mass effect or midline shift. No hydrocephalus. Basilar cisterns are patent. Vascular: No hyperdense vessel. Skull: No acute fracture or focal lesion. Sinuses/Orbits: Bilateral maxillary, sphenoid, ethmoid, frontal mucosal thickening. Right mastoid air cell destruction and effusion with fluid noted within the right middle ear. The orbits are unremarkable. Other: None. IMPRESSION: 1. Subacute appearing right basal ganglia and corona radiata infarction. 2. Pansinusitis as well as right mastoid air cell effusion and fluid within the right middle ear. Correlate with  infection. Electronically Signed   By: Tish Frederickson M.D.   On: 05/24/2021 20:10   MR BRAIN WO CONTRAST  Result Date: 05/25/2021 CLINICAL DATA:  35 year old male with neurologic deficit. Right basal ganglia lacunar infarct 3 days ago. EXAM: MRI HEAD WITHOUT CONTRAST TECHNIQUE: Multiplanar, multiecho pulse sequences of the brain and surrounding structures were obtained without intravenous contrast. COMPARISON:  Brain MRI and intracranial MRA 05/22/2021. FINDINGS: Brain: Slightly increased size and conspicuity of restricted diffusion tracking from the body of the right caudate nucleus into the lentiform. But no significant extension into  surrounding tissue from 3 days ago. Compare series 7, image 54 today to series 3, image 22 previously. Associated T2 and FLAIR hyperintense cytotoxic edema. No hemorrhage. No mass effect. No other restricted diffusion. No midline shift, mass effect, evidence of mass lesion, ventriculomegaly, extra-axial collection or acute intracranial hemorrhage. Cervicomedullary junction and pituitary are within normal limits. Stable gray and white matter signal elsewhere. No chronic cortical encephalomalacia or chronic cerebral blood products identified. Vascular: Major intracranial vascular flow voids are stable. Skull and upper cervical spine: Stable. Sinuses/Orbits: Mildly Disconjugate gaze, otherwise negative orbits. Bilateral paranasal sinus disease is unchanged including opacification and fluid levels. Other: Right greater than left mastoid air cell and/or middle ear fluid appears stable. Symmetric adenoid hypertrophy redemonstrated. Other visible scalp and face soft tissues appear negative. IMPRESSION: 1. Acute lacunar infarct of the right basal ganglia with mildly increased diffusion restriction over the past three days. But no significant regional extension. No associated hemorrhage or mass effect. 2. No new intracranial abnormality. 3. Bilateral paranasal sinus, middle ear, and  mastoid inflammation. Electronically Signed   By: Odessa Fleming M.D.   On: 05/25/2021 04:32   DG CHEST PORT 1 VIEW  Result Date: 05/25/2021 CLINICAL DATA:  LEFT-sided weakness EXAM: PORTABLE CHEST 1 VIEW COMPARISON:  May 22, 2021 FINDINGS: Evaluation is limited by patient rotation. The cardiomediastinal silhouette is unchanged in contour. No pleural effusion. No pneumothorax. No acute pleuroparenchymal abnormality. Visualized abdomen is unremarkable. IMPRESSION: No acute cardiopulmonary abnormality. Electronically Signed   By: Meda Klinefelter M.D.   On: 05/25/2021 09:44   EEG adult  Result Date: 05/26/2021 Charlsie Quest, MD     05/26/2021 10:23 AM Patient Name: Jeven Topper MRN: [id number] Epilepsy Attending: Charlsie Quest Referring Physician/Provider: Dr Marisue Humble Date: 05/26/2021 Duration: 23.40 mins  Patient history:  35 y.o. male with transient left weakness and slurred speech. EEG to evaluate for seizure.  Level of alertness: Awake  AEDs during EEG study: None  Technical aspects: This EEG study was done with scalp electrodes positioned according to the 10-20 International system of electrode placement. Electrical activity was acquired at a sampling rate of 500Hz  and reviewed with a high frequency filter of 70Hz  and a low frequency filter of 1Hz . EEG data were recorded continuously and digitally stored.  Description: The posterior dominant rhythm consists of 10 Hz activity of moderate voltage (25-35 uV) seen predominantly in posterior head regions, symmetric and reactive to eye opening and eye closing. Hyperventilation and photic stimulation were not performed.    IMPRESSION: This study is within normal limits. No seizures or epileptiform discharges were seen throughout the recording. If concern for ictal-interictal activity persists, consider long-term EEG monitoring.  Priyanka O Yadav   VAS TRANSCRANIAL DOPPLER W BUBBLES  Result Date: 05/25/2021  Transcranial Doppler with Bubble  Patient Name:  [facility]  Date of Exam:   05/25/2021 Medical Rec #: 05/27/2021       Accession #:    [facility] Date of Birth: 03-18-1986       Patient Gender: M Patient Age:   61 years Exam Location:  [facility] Procedure:      VAS 01/21/1986 TRANSCRANIAL DOPPLER W BUBBLES Referring Phys: 20 --------------------------------------------------------------------------------  Indications: Stroke. Comparison Study: No prior studies. Performing Technologist: [facility] RVT  Examination Guidelines: A complete evaluation includes B-mode imaging, spectral Doppler, color Doppler, and power Doppler as needed of all accessible portions of each vessel. Bilateral testing is considered an integral part of a complete examination.  Limited examinations for reoccurring indications may be performed as noted.  Summary:  A vascular evaluation was performed. The right middle cerebral artery was studied. An IV was inserted into the patient's left Cephalic vein. Verbal informed consent was obtained.  Negative for PFO. NEGATIVE TCD Bubble study with no evidence of right to left shunt noted. *See table(s) above for TCD measurements and observations.  Diagnosing physician: Delia Heady MD Electronically signed by Delia Heady MD on 05/25/2021 at 3:48:47 PM.    Final    VAS [place] LOWER EXTREMITY VENOUS (DVT)  Result Date: 05/25/2021  Lower Venous DVT Study Patient Name:  [facility]  Date of Exam:   05/25/2021 Medical Rec #: [id number]       Accession #:    [id number] Date of Birth: 1986-07-11       Patient Gender: M Patient Age:   58 years Exam Location:  [facility] Procedure:      VAS [place] LOWER EXTREMITY VENOUS (DVT) Referring Phys: Gevena Mart --------------------------------------------------------------------------------  Indications: Stroke.  Risk Factors: None identified. Limitations: Poor ultrasound/tissue interface and body habitus. Comparison Study: No prior studies. Performing Technologist: Chanda Busing RVT  Examination Guidelines: A complete evaluation includes B-mode imaging, spectral Doppler, color Doppler, and power Doppler as needed of all accessible portions of each vessel. Bilateral testing is considered an integral part of a complete examination. Limited examinations for reoccurring indications may be performed as noted. The reflux portion of the exam is performed with the patient in reverse Trendelenburg.  +---------+---------------+---------+-----------+----------+--------------+ RIGHT    CompressibilityPhasicitySpontaneityPropertiesThrombus Aging +---------+---------------+---------+-----------+----------+--------------+ CFV      Full           Yes      Yes                                 +---------+---------------+---------+-----------+----------+--------------+ SFJ      Full                                                        +---------+---------------+---------+-----------+----------+--------------+ FV Prox  Full                                                        +---------+---------------+---------+-----------+----------+--------------+ FV Mid   Full                                                        +---------+---------------+---------+-----------+----------+--------------+ FV DistalFull                                                        +---------+---------------+---------+-----------+----------+--------------+ PFV      Full                                                        +---------+---------------+---------+-----------+----------+--------------+  POP      Full           Yes      Yes                                 +---------+---------------+---------+-----------+----------+--------------+ PTV      Full                                                        +---------+---------------+---------+-----------+----------+--------------+ PERO     Full                                                         +---------+---------------+---------+-----------+----------+--------------+   +---------+---------------+---------+-----------+----------+--------------+ LEFT     CompressibilityPhasicitySpontaneityPropertiesThrombus Aging +---------+---------------+---------+-----------+----------+--------------+ CFV      Full           Yes      Yes                                 +---------+---------------+---------+-----------+----------+--------------+ SFJ      Full                                                        +---------+---------------+---------+-----------+----------+--------------+ FV Prox  Full                                                        +---------+---------------+---------+-----------+----------+--------------+ FV Mid   Full                                                        +---------+---------------+---------+-----------+----------+--------------+ FV DistalFull                                                        +---------+---------------+---------+-----------+----------+--------------+ PFV      Full                                                        +---------+---------------+---------+-----------+----------+--------------+ POP      Full           Yes      Yes                                 +---------+---------------+---------+-----------+----------+--------------+  PTV      Full                                                        +---------+---------------+---------+-----------+----------+--------------+ PERO     Full                                                        +---------+---------------+---------+-----------+----------+--------------+     Summary: RIGHT: - There is no evidence of deep vein thrombosis in the lower extremity. However, portions of this examination were limited- see technologist comments above.  - No cystic structure found in the popliteal fossa.  LEFT: - There is no evidence of deep vein  thrombosis in the lower extremity. However, portions of this examination were limited- see technologist comments above.  - No cystic structure found in the popliteal fossa.  *See table(s) above for measurements and observations. Electronically signed by Waverly Ferrari MD on 05/25/2021 at 2:38:24 PM.    Final       Scheduled Meds:  aspirin  325 mg Oral Daily   atorvastatin  80 mg Oral Daily   clopidogrel  75 mg Oral Daily   enoxaparin (LOVENOX) injection  40 mg Subcutaneous Q24H   sodium chloride flush  3 mL Intravenous Q12H   Continuous Infusions:  sodium chloride     sodium chloride Stopped (05/26/21 0930)     LOS: 0 days      Time spent: 30 minutes   Noralee Stain, DO [facility] 05/26/2021, 1:17 PM   Available via Epic secure chat 7am-7pm After these hours, please refer to coverage provider listed on [email or web address]

## 2021-05-26 NOTE — Progress Notes (Signed)
EEG complete - results pending 

## 2021-05-26 NOTE — Progress Notes (Signed)
Pt sitting up in chair, denies pain or discomfort, confirms continued numbness to left arm, asymmetrical smile and slurred speech, wife at bedside, educated both on no eating or drinking after midnight for test tomorrow, both verbalized understanding, npo sign placed on door, all needs addressed, reassessment no changes from as assessment.

## 2021-05-26 NOTE — Progress Notes (Signed)
    [facility] has been requested to perform a transesophageal echocardiogram on 05/26/2021 for stroke.  After careful review of history and examination, the risks and benefits of transesophageal echocardiogram have been explained including risks of esophageal damage, perforation (1:10,000 risk), bleeding, pharyngeal hematoma as well as other potential complications associated with conscious sedation including aspiration, arrhythmia, respiratory failure and death. Alternatives to treatment were discussed, questions were answered. Patient is willing to proceed.   35 yo male presented with subacute CVA. CT of chest showed subacute R basal ganglia and corona radiata infarct. Pending TEE. Vital stable. Normal hemoglobin and platelet.   Talullah Abate, PA-C 05/26/2021 2:57 PM   

## 2021-05-26 NOTE — H&P (View-Only) (Signed)
    [facility] has been requested to perform a transesophageal echocardiogram on 05/26/2021 for stroke.  After careful review of history and examination, the risks and benefits of transesophageal echocardiogram have been explained including risks of esophageal damage, perforation (1:10,000 risk), bleeding, pharyngeal hematoma as well as other potential complications associated with conscious sedation including aspiration, arrhythmia, respiratory failure and death. Alternatives to treatment were discussed, questions were answered. Patient is willing to proceed.   35 yo male presented with subacute CVA. CT of chest showed subacute R basal ganglia and corona radiata infarct. Pending TEE. Vital stable. Normal hemoglobin and platelet.   Azalee Course, PA-C 05/26/2021 2:57 PM

## 2021-05-26 NOTE — Procedures (Addendum)
Patient Name: Belen Zwahlen  MRN: [id number]  Epilepsy Attending: Charlsie Quest  Referring Physician/Provider: Dr Marisue Humble Date: 05/26/2021 Duration: 23.40 mins   Patient history:  35 y.o. male with transient left weakness and slurred speech. EEG to evaluate for seizure.   Level of alertness: Awake   AEDs during EEG study: None   Technical aspects: This EEG study was done with scalp electrodes positioned according to the 10-20 International system of electrode placement. Electrical activity was acquired at a sampling rate of 500Hz  and reviewed with a high frequency filter of 70Hz  and a low frequency filter of 1Hz . EEG data were recorded continuously and digitally stored.    Description: The posterior dominant rhythm consists of 10 Hz activity of moderate voltage (25-35 uV) seen predominantly in posterior head regions, symmetric and reactive to eye opening and eye closing. Hyperventilation and photic stimulation were not performed.      IMPRESSION: This study is within normal limits. No seizures or epileptiform discharges were seen throughout the recording.  If concern for ictal-interictal activity persists, consider long-term EEG monitoring.   Althea Backs 

## 2021-05-26 NOTE — Progress Notes (Signed)
STROKE TEAM PROGRESS NOTE   INTERVAL HISTORY He is sitting in the bedside chair.  He still complains of subjective left face and hand weakness which is not Ernest [name] back to his baseline 3 days ago.  Left leg strength is good.  TCD bubble study done yesterday at the bedside was negative for PFO.  Anticardiolipin antibodies are pending.  TEE is ordered but not sure when scheduled.  Vital signs stable.  Neurological exam unchanged.  EEG was normal. Vitals:   05/25/21 1954 05/25/21 2331 05/26/21 0419 05/26/21 0809  BP: (!) 122/93 (!) 144/82 128/84 (!) 132/91  Pulse: 91 68 72   Resp: 19 18 19 20   Temp: 98 F (36.7 C) 98.4 F (36.9 C) 98 F (36.7 C) 98.4 F (36.9 C)  TempSrc: Oral Axillary Axillary Oral  SpO2: 93% 98% 100% 99%  Weight:      Height:       CBC:  Recent Labs  Lab 05/23/21 0150 05/24/21 1848 05/24/21 1855 05/25/21 0500 05/26/21 0111  WBC 11.1* 15.1*  --  15.1* 14.6*  NEUTROABS 5.5 8.8*  --   --   --   HGB 14.1 14.6   < > 14.4 14.5  HCT 41.9 43.9   < > 43.4 43.2  MCV 85.9 85.6  --  85.4 85.9  PLT 287 313  --  294 305   < > = values in this interval not displayed.   Basic Metabolic Panel:  Recent Labs  Lab 05/25/21 0500 05/26/21 0111  NA 136 135  K 3.6 3.8  CL 105 105  CO2 22 20*  GLUCOSE 104* 93  BUN 10 12  CREATININE 1.02 0.97  CALCIUM 8.9 8.7*   Lipid Panel:  Recent Labs  Lab 05/22/21 0845  CHOL 186  TRIG 88  HDL 48  CHOLHDL 3.9  VLDL 18  LDLCALC 05/24/21*   HgbA1c:  Recent Labs  Lab 05/22/21 0845  HGBA1C 5.5   Urine Drug Screen:  Recent Labs  Lab 05/25/21 0307  LABOPIA NONE DETECTED  COCAINSCRNUR NONE DETECTED  LABBENZ NONE DETECTED  AMPHETMU NONE DETECTED  THCU NONE DETECTED  LABBARB NONE DETECTED    Alcohol Level  Recent Labs  Lab 05/24/21 1848  ETH <10    IMAGING past 24 hours EEG adult  Result Date: 05/26/2021 05/28/2021, [name]     05/26/2021 10:23 AM Ernest [name] Name: Ernest [name] Epilepsy Ernest [name]:  [id number] Referring Physician/Provider: Dr Ernest [name] Date: 05/26/2021 Duration: 23.40 mins  Ernest [name] history:  35 y.o. [name] with transient left weakness and slurred speech. EEG to evaluate for seizure.  Level of alertness: Awake  AEDs during EEG study: None  Technical aspects: This EEG study was done with scalp electrodes positioned according to the 10-20 International system of electrode placement. Electrical activity was acquired at a sampling rate of 500Hz  and reviewed with a high frequency filter of 70Hz  and a low frequency filter of 1Hz . EEG data were recorded continuously and digitally stored.  Description: The posterior dominant rhythm consists of 10 Hz activity of moderate voltage (25-35 uV) seen predominantly in posterior head regions, symmetric and reactive to eye opening and eye closing. Hyperventilation and photic stimulation were not performed.    IMPRESSION: This study is within normal limits. No seizures or epileptiform discharges were seen throughout the recording. If concern for ictal-interictal activity persists, consider long-term EEG monitoring.  Ernest [name]   VAS 20 TRANSCRANIAL DOPPLER W BUBBLES  Result Date: 05/25/2021  Transcranial Doppler with Bubble Ernest [name] Name:  Ernest [name]  Date of Exam:   05/25/2021 Medical Rec #: [id number]       Accession #:    [id number] Date of Birth: 09-16-1985       Ernest [name] Gender: M Ernest [name] Age:   57 years Exam Location:  [facility] Procedure:      VAS [place] TRANSCRANIAL DOPPLER W BUBBLES Referring Phys: Ernest [name] --------------------------------------------------------------------------------  Indications: Stroke. Comparison Study: No prior studies. Performing Technologist: Ernest [name]  Examination Guidelines: A complete evaluation includes B-mode imaging, spectral Doppler, color Doppler, and power Doppler as needed of all accessible portions of each vessel. Bilateral testing is considered an integral part of a complete  examination. Limited examinations for reoccurring indications may be performed as noted.  Summary:  A vascular evaluation was performed. The right middle cerebral artery was studied. An IV was inserted into the Ernest [name]'s left Cephalic vein. Verbal informed consent was obtained.  Negative for PFO. NEGATIVE TCD Bubble study with no evidence of right to left shunt noted. *See table(s) above for TCD measurements and observations.  Diagnosing physician: Ernest [name] Electronically Ernest [name] by Ernest [name] on 05/25/2021 at 3:48:47 PM.    Final    VAS [place] LOWER EXTREMITY VENOUS (DVT)  Result Date: 05/25/2021  Lower Venous DVT Study Ernest [name] Name:  Ernest [name]  Date of Exam:   05/25/2021 Medical Rec #: [id number]       Accession #:    [id number] Date of Birth: [birth year]       Ernest [name] Gender: M Ernest [name] Age:   23 years Exam Location:  [facility] Procedure:      VAS [place] LOWER EXTREMITY VENOUS (DVT) Referring Phys: Ernest [name] --------------------------------------------------------------------------------  Indications: Stroke.  Risk Factors: None identified. Limitations: Poor ultrasound/tissue interface and body habitus. Comparison Study: No prior studies. Performing Technologist: Ernest [name]  Examination Guidelines: A complete evaluation includes B-mode imaging, spectral Doppler, color Doppler, and power Doppler as needed of all accessible portions of each vessel. Bilateral testing is considered an integral part of a complete examination. Limited examinations for reoccurring indications may be performed as noted. The reflux portion of the exam is performed with the Ernest [name] in reverse Trendelenburg.  +---------+---------------+---------+-----------+----------+--------------+ RIGHT    CompressibilityPhasicitySpontaneityPropertiesThrombus Aging +---------+---------------+---------+-----------+----------+--------------+ CFV      Full           Yes      Yes                                  +---------+---------------+---------+-----------+----------+--------------+ SFJ      Full                                                        +---------+---------------+---------+-----------+----------+--------------+ FV Prox  Full                                                        +---------+---------------+---------+-----------+----------+--------------+ FV Mid   Full                                                        +---------+---------------+---------+-----------+----------+--------------+  FV DistalFull                                                        +---------+---------------+---------+-----------+----------+--------------+ PFV      Full                                                        +---------+---------------+---------+-----------+----------+--------------+ POP      Full           Yes      Yes                                 +---------+---------------+---------+-----------+----------+--------------+ PTV      Full                                                        +---------+---------------+---------+-----------+----------+--------------+ PERO     Full                                                        +---------+---------------+---------+-----------+----------+--------------+   +---------+---------------+---------+-----------+----------+--------------+ LEFT     CompressibilityPhasicitySpontaneityPropertiesThrombus Aging +---------+---------------+---------+-----------+----------+--------------+ CFV      Full           Yes      Yes                                 +---------+---------------+---------+-----------+----------+--------------+ SFJ      Full                                                        +---------+---------------+---------+-----------+----------+--------------+ FV Prox  Full                                                         +---------+---------------+---------+-----------+----------+--------------+ FV Mid   Full                                                        +---------+---------------+---------+-----------+----------+--------------+ FV DistalFull                                                        +---------+---------------+---------+-----------+----------+--------------+   PFV      Full                                                        +---------+---------------+---------+-----------+----------+--------------+ POP      Full           Yes      Yes                                 +---------+---------------+---------+-----------+----------+--------------+ PTV      Full                                                        +---------+---------------+---------+-----------+----------+--------------+ PERO     Full                                                        +---------+---------------+---------+-----------+----------+--------------+     Summary: RIGHT: - There is no evidence of deep vein thrombosis in the lower extremity. However, portions of this examination were limited- see technologist comments above.  - No cystic structure found in the popliteal fossa.  LEFT: - There is no evidence of deep vein thrombosis in the lower extremity. However, portions of this examination were limited- see technologist comments above.  - No cystic structure found in the popliteal fossa.  *See table(s) above for measurements and observations. Electronically Ernest [name] by Waverly Ferrari [name] on 05/25/2021 at 2:38:24 PM.    Final     PHYSICAL EXAM  Temp:  [98 F (36.7 C)-98.4 F (36.9 C)] 98.4 F (36.9 C) (10/24 0809) Pulse Rate:  [68-91] 72 (10/24 0419) Resp:  [18-20] 20 (10/24 0809) BP: (122-146)/(82-107) 132/91 (10/24 0809) SpO2:  [93 %-100 %] 99 % (10/24 0809) Weight:  [107.9 kg] 107.9 kg (10/23 1714)  General -obese Ernest [name], in no apparent distress.  Ophthalmologic  - fundi not visualized due to noncooperation.  Cardiovascular - Regular rhythm and rate.  Mental Status -  Level of arousal and orientation to time, place, and person were intact. Language including expression, naming, repetition, comprehension was assessed and found intact. Attention span and concentration were normal. Recent and remote memory were intact. Fund of Knowledge was assessed and was intact.  Cranial Nerves II - XII - II - Visual field intact OU. III, IV, VI - Extraocular movements intact. V - Facial sensation intact bilaterally. VII - Facial movement with moderate left facial droop VIII - Hearing & vestibular intact bilaterally. X - Palate elevates symmetrically. XI - Chin turning & shoulder shrug intact bilaterally. XII - Tongue protrusion intact.  Motor Strength - left arm weaker 4/5, proximally with significant weakness of left grip and intrinsic hand muscles noted. LLE, RUE, RLE 5/5  Bulk was normal and fasciculations were absent.   Motor Tone - Muscle tone was assessed at the neck and appendages and was normal.  Sensory - Light touch, temperature/pinprick were assessed and were symmetrical.  Coordination - The Ernest [name] had normal movements in the hands and feet with no ataxia or dysmetria.  Tremor was absent.  Gait and Station - deferred.   ASSESSMENT/PLAN Ernest [name] is a 35 y.o. 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 Ernest [name] was admitted from October 20 to October 21 after he had a stroke.  He had an echo cardiogram at that time an MRI showed CVA.  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   CT head Subacute  appearing right basal ganglia and corona radiata infarction. 2D Echo EF 60-65%. No atrial shunt   TEE pending lower extremities negative for DVT TCD with bubble study negative for right-to-left shunt EEG no seizure activity UDS negative  LDL 120 HgbA1c 5.5 VTE prophylaxis - Lovenox and SCD's    Diet   Diet Heart Room service appropriate? Yes; Fluid consistency: Thin   aspirin 325 mg daily and clopidogrel 75 mg daily prior to admission, now on aspirin 325 mg daily and clopidogrel 75 mg daily.  Therapy recommendations:  pending  Disposition:  home  Hypertension Home meds:  none Stable Permissive hypertension (OK if < 220/120) but gradually normalize in 5-7 days Long-term BP goal normotensive  Hyperlipidemia Home meds:  atorvastatin 80, resumed in [name] LDL 120, goal < 70 Continue statin at discharge  Tobacco abuse Current smoker Smoking cessation counseling provided Pt is willing to quit  Other Stroke Risk Factors Cigarette smoker advised to stop smoking Obesity, Body mass index is 42.14 kg/m., BMI >/= 30 associated with increased stroke risk, recommend weight loss, diet and exercise as appropriate  Hx stroke/TIA Likely Obstructive sleep apnea, will need outpatient sleep study   [name] day # 0   Ernest [name] recently admitted with left face and arm weakness secondary to large subcortical right brain infarct etiology likely cryptogenic even though the MRA shows distal M1 stenosis versus prion with area of the infarct and likely represents proximal embolus which is recanalized and moved distally.  Repeat MRI does not show significant change in infarct size only expected evolutionary changes.  Check   TEE for cardiac source of embolism.  Marland Kitchen  Anticardiolipin antibodies and ANA.  Outpatient sleep study for obstructive sleep apnea.  Continue dual antiplatelet therapy and aggressive risk factor modification.  Long discussion with Ernest [name]  at the bedside and answered questions.   Ernest [name] is discharged home after TEE and can follow-up as an outpatient in the stroke clinic in 2 months.  Follow results of anticardiolipin antibodies and ANA will need outpatient sleep study scheduled.  Greater than 50% time during this 25-minute visit was spent in counseling and coordination of care and discussion with care Ernest Heady, [name] Medical Director [facility] Pager: [phone] 05/26/2021 12:47 PM   To contact Stroke Continuity provider, please refer to [email or web address]. After hours, contact General Neurology

## 2021-05-27 ENCOUNTER — Inpatient Hospital Stay (HOSPITAL_COMMUNITY): Payer: 59

## 2021-05-27 DIAGNOSIS — G4733 Obstructive sleep apnea (adult) (pediatric): Secondary | ICD-10-CM | POA: Diagnosis present

## 2021-05-27 DIAGNOSIS — Z6841 Body Mass Index (BMI) 40.0 and over, adult: Secondary | ICD-10-CM | POA: Diagnosis not present

## 2021-05-27 DIAGNOSIS — Q2112 Patent foramen ovale: Secondary | ICD-10-CM | POA: Diagnosis not present

## 2021-05-27 DIAGNOSIS — Z91199 Patient's noncompliance with other medical treatment and regimen due to unspecified reason: Secondary | ICD-10-CM | POA: Diagnosis not present

## 2021-05-27 DIAGNOSIS — I10 Essential (primary) hypertension: Secondary | ICD-10-CM | POA: Diagnosis present

## 2021-05-27 DIAGNOSIS — R29810 Facial weakness: Secondary | ICD-10-CM | POA: Diagnosis present

## 2021-05-27 DIAGNOSIS — I639 Cerebral infarction, unspecified: Secondary | ICD-10-CM | POA: Diagnosis not present

## 2021-05-27 DIAGNOSIS — Z7982 Long term (current) use of aspirin: Secondary | ICD-10-CM | POA: Diagnosis not present

## 2021-05-27 DIAGNOSIS — Z7902 Long term (current) use of antithrombotics/antiplatelets: Secondary | ICD-10-CM | POA: Diagnosis not present

## 2021-05-27 DIAGNOSIS — R197 Diarrhea, unspecified: Secondary | ICD-10-CM | POA: Diagnosis present

## 2021-05-27 DIAGNOSIS — Z79899 Other long term (current) drug therapy: Secondary | ICD-10-CM | POA: Diagnosis not present

## 2021-05-27 DIAGNOSIS — Z20822 Contact with and (suspected) exposure to covid-19: Secondary | ICD-10-CM | POA: Diagnosis present

## 2021-05-27 DIAGNOSIS — R569 Unspecified convulsions: Secondary | ICD-10-CM | POA: Diagnosis not present

## 2021-05-27 DIAGNOSIS — Z23 Encounter for immunization: Secondary | ICD-10-CM | POA: Diagnosis not present

## 2021-05-27 DIAGNOSIS — I63511 Cerebral infarction due to unspecified occlusion or stenosis of right middle cerebral artery: Secondary | ICD-10-CM | POA: Diagnosis not present

## 2021-05-27 DIAGNOSIS — R4781 Slurred speech: Secondary | ICD-10-CM | POA: Diagnosis present

## 2021-05-27 DIAGNOSIS — I6381 Other cerebral infarction due to occlusion or stenosis of small artery: Secondary | ICD-10-CM | POA: Diagnosis present

## 2021-05-27 DIAGNOSIS — E785 Hyperlipidemia, unspecified: Secondary | ICD-10-CM | POA: Diagnosis present

## 2021-05-27 DIAGNOSIS — R29701 NIHSS score 1: Secondary | ICD-10-CM | POA: Diagnosis present

## 2021-05-27 DIAGNOSIS — F17210 Nicotine dependence, cigarettes, uncomplicated: Secondary | ICD-10-CM | POA: Diagnosis present

## 2021-05-27 LAB — CBC
HCT: 41.1 % (ref 39.0–52.0)
Hemoglobin: 13.7 g/dL (ref 13.0–17.0)
MCH: 28.3 pg (ref 26.0–34.0)
MCHC: 33.3 g/dL (ref 30.0–36.0)
MCV: 84.9 fL (ref 80.0–100.0)
Platelets: 284 10*3/uL (ref 150–400)
RBC: 4.84 MIL/uL (ref 4.22–5.81)
RDW: 13.3 % (ref 11.5–15.5)
WBC: 12.5 10*3/uL — ABNORMAL HIGH (ref 4.0–10.5)
nRBC: 0 % (ref 0.0–0.2)

## 2021-05-27 LAB — HEPATIC FUNCTION PANEL
ALT: 105 U/L — ABNORMAL HIGH (ref 0–44)
AST: 77 U/L — ABNORMAL HIGH (ref 15–41)
Albumin: 3.2 g/dL — ABNORMAL LOW (ref 3.5–5.0)
Alkaline Phosphatase: 82 U/L (ref 38–126)
Bilirubin, Direct: 0.1 mg/dL (ref 0.0–0.2)
Indirect Bilirubin: 0.9 mg/dL (ref 0.3–0.9)
Total Bilirubin: 1 mg/dL (ref 0.3–1.2)
Total Protein: 6.9 g/dL (ref 6.5–8.1)

## 2021-05-27 LAB — ANA W/REFLEX IF POSITIVE: Anti Nuclear Antibody (ANA): NEGATIVE

## 2021-05-27 IMAGING — US US ABDOMEN LIMITED
1 series · 14 of 25 positions shown · non-contrast
Comparison: None.

CLINICAL DATA: Elevated LFTs

EXAM:
ULTRASOUND ABDOMEN LIMITED RIGHT UPPER QUADRANT

[Series 1: us abdomen limited ruq (liver/gb) · 14 of 43 slices shown]
[im 1/43]
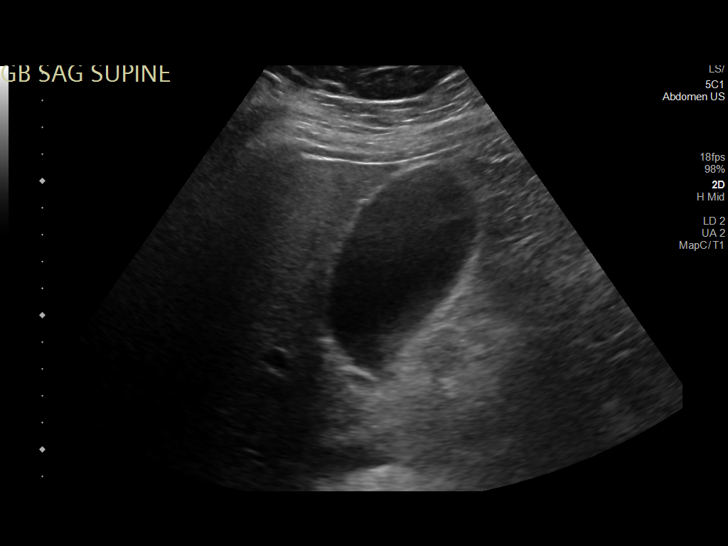
[im 4/43]
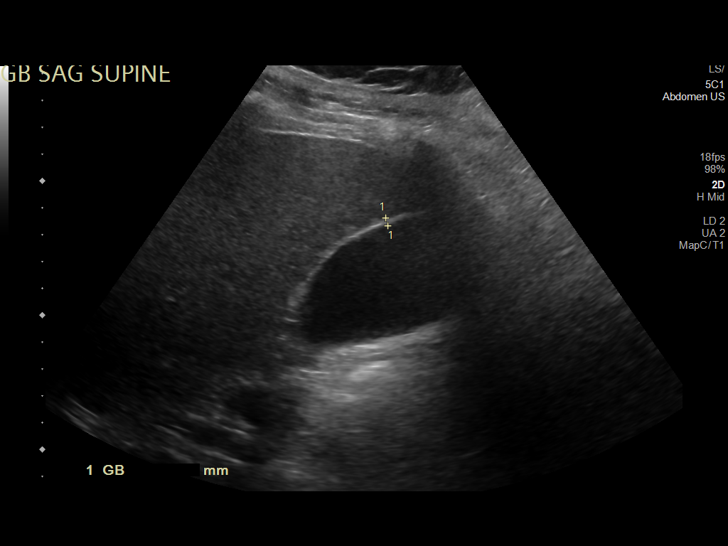
[im 8/43]
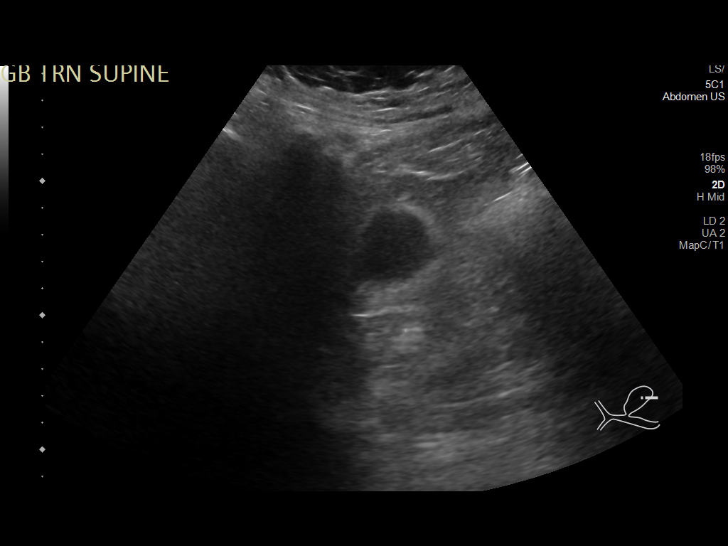
[im 11/43]
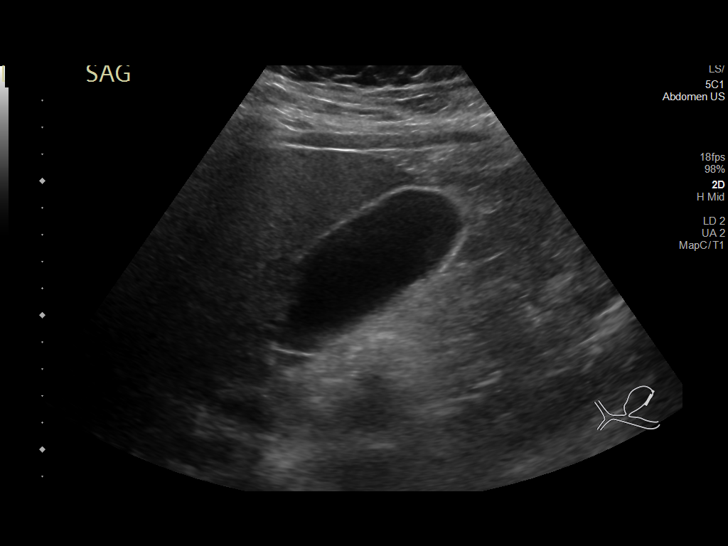
[im 15/43]
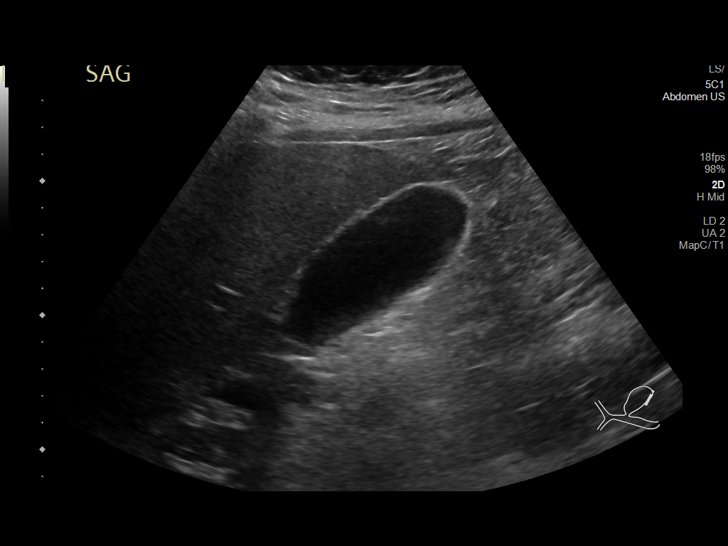
[im 16/43]
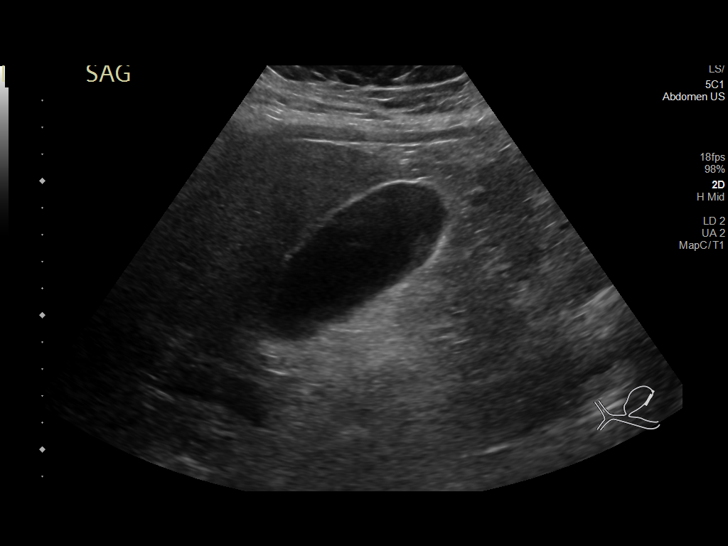
[im 20/43]
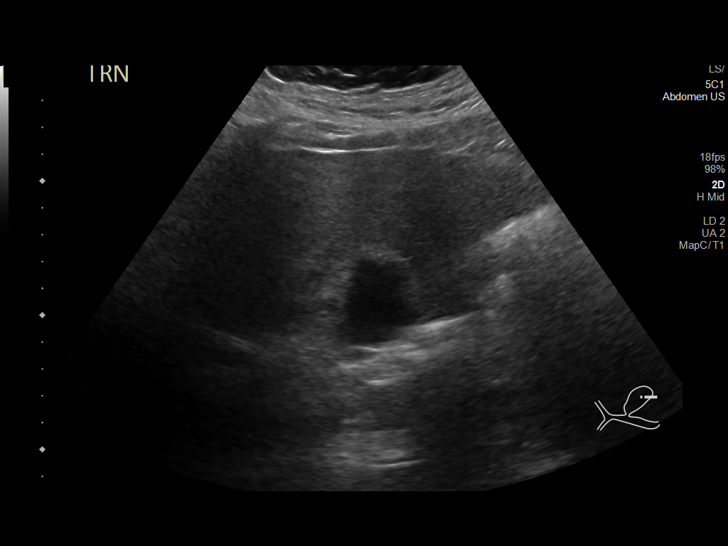
[im 23/43]
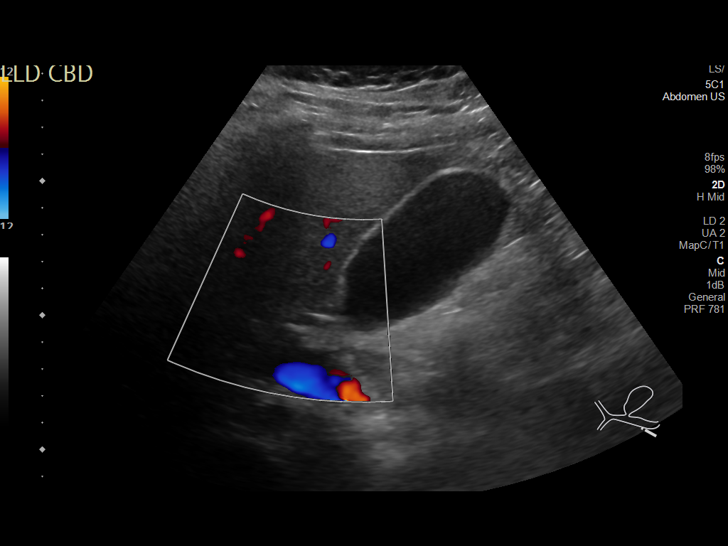
[im 27/43]
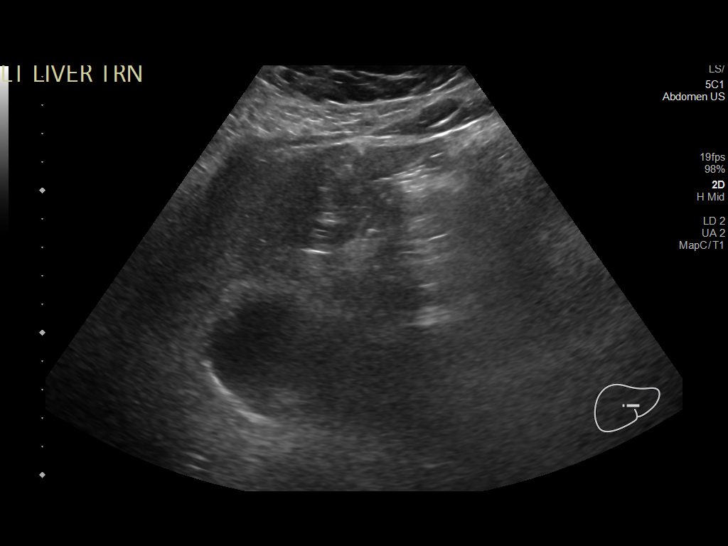
[im 29/43]
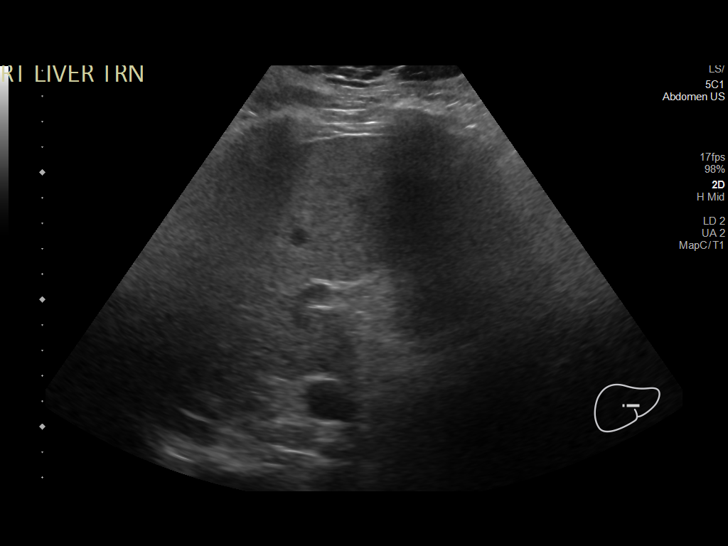
[im 32/43]
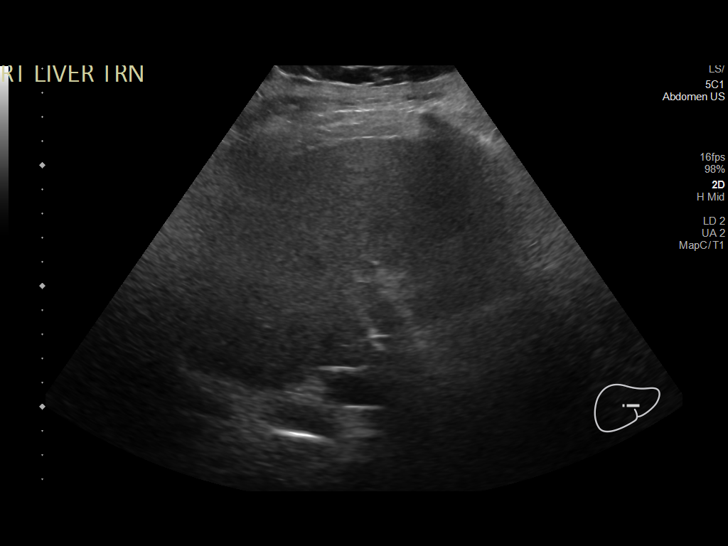
[im 36/43]
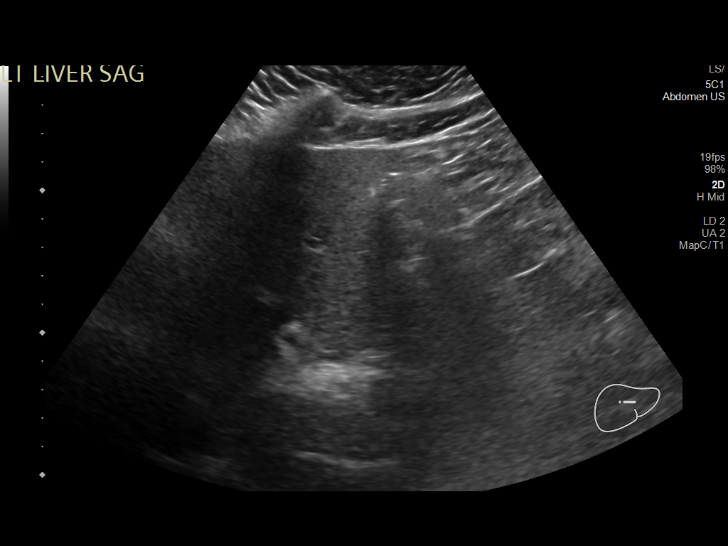
[im 39/43]
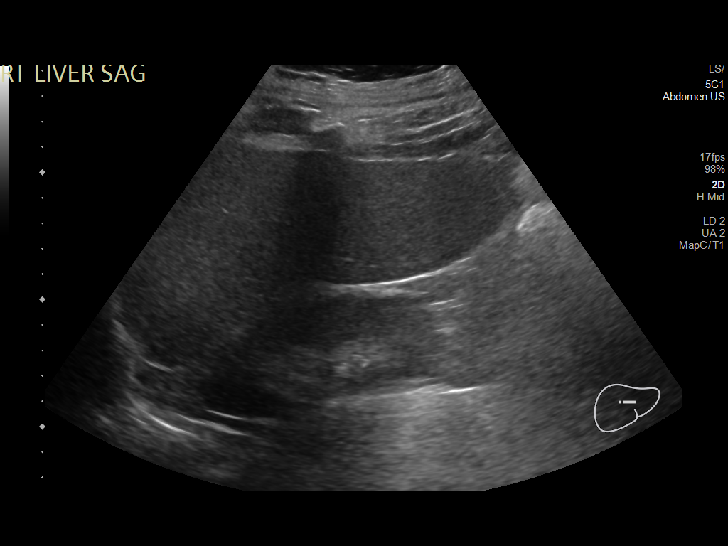
[im 43/43]
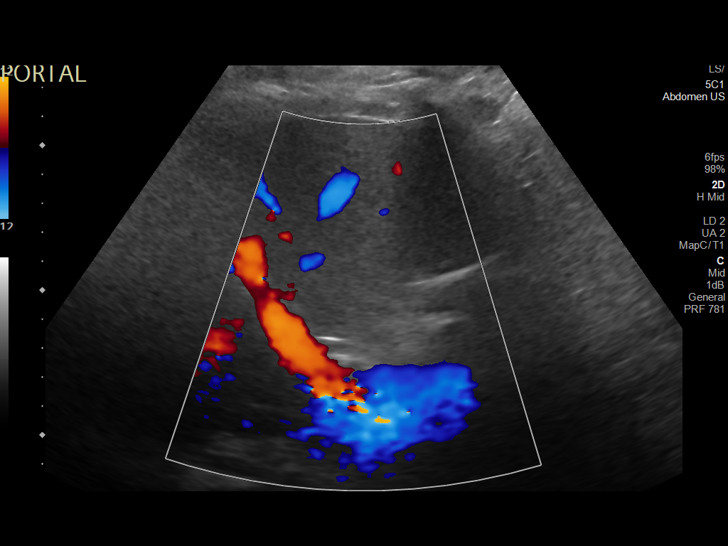

[14 of 25 positions shown; findings below may reference images not displayed]

FINDINGS: Gallbladder:

No gallstones or wall thickening visualized. No sonographic Murphy
sign noted by sonographer.

Common bile duct:

Diameter: 4 mm

Liver:

No focal lesion identified. Increased parenchymal echogenicity.
Portal vein is patent on color Doppler imaging with normal direction
of blood flow towards the liver.

Other: None.
IMPRESSION: 1.  Hepatic steatosis.

2.  No acute ultrasound findings of the right upper quadrant.

## 2021-05-27 NOTE — Progress Notes (Signed)
PROGRESS NOTE    Ernest [name]  OEV:[id number] DOB: Oct 31, 1985 DOA: 05/24/2021 PCP: Patient, No Pcp Per (Inactive)     Brief Narrative:  Ernest [name] is a 35 y.o. 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  Patient was admitted from October 20 to October 21 after he had a stroke.  He had an echo cardiogram at that time and MRI showed CVA.  He was discharged on the 21st and was feeling good at that point.  After returning home he had numbness in his left hand and developed difficulty speaking with confusion and a drooping left side of face.  When the symptoms did not improve his wife brought him back to the emergency room.  He did have numbness in his left leg that waxed and waned over a few hours and then resolved.  He then developed numbness in his left hand which continues.  Wife reports that he had difficulty speaking and the family could not understand what he was saying.   CT of the head showed right basal ganglia and right corona radiata subacute infarction.  No acute intracranial hemorrhage. Neurology consulted.   New events last 24 hours / Subjective: Continues to have his neurological deficits including left-sided facial droop, speech difficulties, numbness of the left hand.  He denies any nausea or vomiting, abdominal pain, does not drink alcohol, does not have any history of liver disease to his knowledge.    Assessment & Plan:   Principal Problem:   Seizure (HCC) Active Problems:   CVA (cerebral vascular accident) (HCC)   Obesity, Class III, BMI 40-49.9 (morbid obesity) (HCC)   Leukocytosis   Hypocalcemia   Paresthesia   CVA -Readmissions for similar symptoms -MRI brain: Acute lacunar infarct of the right basal ganglia with mildly increased diffusion restriction over the past three days. But no  significant regional extension. No associated hemorrhage or mass effect. -Echocardiogram was completed 10/21  -EEG without seizure activity -TCD bubble study negative for PFO -Venous dopplers negative for DVT  -Anticardiolipin antibodies pending  -Continue aspirin, Plavix, lipitor  -TEE scheduled for 10/26 -Appreciate neurology. Follow up with Neurology in 2 months. Will also need outpatient sleep study   Elevated LFT -Unclear etiology, denies specific alcohol abuse, previous history of liver disease -Hepatitis panel negative  -Right upper quadrant ultrasound pending  Diarrhea -C Diff negative     DVT prophylaxis:  enoxaparin (LOVENOX) injection 40 mg Start: 05/25/21 1415  Code Status: Full code Family Communication: No family at bedside  Disposition Plan:  Status is: Inpatient  Remains inpatient appropriate because: Continued work-up including TEE, right upper quadrant ultrasound     Consultants:  Neurology   Antimicrobials:  Anti-infectives (From admission, onward)    None        Objective: Vitals:   05/26/21 2345 05/27/21 0000 05/27/21 0445 05/27/21 0900  BP: 131/75 126/70 138/88 137/90  Pulse:    70  Resp: 19 18 18 18   Temp: 98 F (36.7 C) 97.9 F (36.6 C) 97.9 F (36.6 C) 98.3 F (36.8 C)  TempSrc: Oral Oral Oral Oral  SpO2: 98% 99%  95%  Weight:      Height:       No intake or output data in the 24 hours ending 05/27/21 1135  Filed Weights   05/25/21 0230 05/25/21 1714  Weight: 110.2 kg  107.9 kg    Examination:  General exam: Appears calm and comfortable  Respiratory system: Clear to auscultation. Respiratory effort normal. No respiratory distress. No conversational dyspnea.  Cardiovascular system: S1 & S2 heard, RRR. No murmurs. No pedal edema. Gastrointestinal system: Abdomen is nondistended, soft and nontender. Normal bowel sounds heard. Central nervous system: Alert and oriented. +left facial droop, dysarthria, strength 5/5 all  extremities  Extremities: Symmetric in appearance  Skin: No rashes, lesions or ulcers on exposed skin  Psychiatry: Judgement and insight appear normal. Mood & affect appropriate.   Data Reviewed: I have personally reviewed following labs and imaging studies  CBC: Recent Labs  Lab 05/22/21 0240 05/22/21 0246 05/23/21 0150 05/24/21 1848 05/24/21 1855 05/25/21 0500 05/26/21 0111 05/27/21 0355  WBC 11.7*  --  11.1* 15.1*  --  15.1* 14.6* 12.5*  NEUTROABS 5.7  --  5.5 8.8*  --   --   --   --   HGB 14.7   < > 14.1 14.6 15.6 14.4 14.5 13.7  HCT 43.8   < > 41.9 43.9 46.0 43.4 43.2 41.1  MCV 85.5  --  85.9 85.6  --  85.4 85.9 84.9  PLT 289  --  287 313  --  294 305 284   < > = values in this interval not displayed.    Basic Metabolic Panel: Recent Labs  Lab 05/22/21 0240 05/22/21 0246 05/23/21 0150 05/24/21 1848 05/24/21 1855 05/25/21 0500 05/26/21 0111  NA 135   < > 136 135 139 136 135  K 3.8   < > 3.7 3.6 3.8 3.6 3.8  CL 104   < > 105 104 104 105 105  CO2 23  --  25 23  --  22 20*  GLUCOSE 109*   < > 93 108* 106* 104* 93  BUN 8   < > CREATININE 0.98   < > 1.00 0.98 0.90 1.02 0.97  CALCIUM 8.7*  --  8.6* 8.7*  --  8.9 8.7*   < > = values in this interval not displayed.    GFR: Estimated Creatinine Clearance: 117.3 mL/min (by C-G formula based on SCr of 0.97 mg/dL). Liver Function Tests: Recent Labs  Lab 05/23/21 0150 05/24/21 1848 05/25/21 0500 05/26/21 0111 05/27/21 0355  AST 45* 46* 51* 79* 77*  ALT 69* 64* 68* 84* 105*  ALKPHOS 81 88 84 81 82  BILITOT 1.2 0.8 1.0 1.1 1.0  PROT 6.7 7.6 7.4 7.3 6.9  ALBUMIN 3.2* 3.5 3.5 3.3* 3.2*    No results for input(s): LIPASE, AMYLASE in the last 168 hours. No results for input(s): AMMONIA in the last 168 hours. Coagulation Profile: Recent Labs  Lab 05/22/21 0240 05/24/21 1848  INR 1.0 1.0    Cardiac Enzymes: No results for input(s): CKTOTAL, CKMB, CKMBINDEX, TROPONINI in the last 168  hours. BNP (last 3 results) No results for input(s): PROBNP in the last 8760 hours. HbA1C: No results for input(s): HGBA1C in the last 72 hours. CBG: Recent Labs  Lab 05/22/21 0305  GLUCAP 108*    Lipid Profile: No results for input(s): CHOL, HDL, LDLCALC, TRIG, CHOLHDL, LDLDIRECT in the last 72 hours. Thyroid Function Tests: No results for input(s): TSH, T4TOTAL, FREET4, T3FREE, THYROIDAB in the last 72 hours. Anemia Panel: No results for input(s): VITAMINB12, FOLATE, FERRITIN, TIBC, IRON, RETICCTPCT in the last 72 hours. Sepsis Labs: No results for input(s): PROCALCITON, LATICACIDVEN in the last 168 hours.  Recent Results (  from the past 240 hour(s))  Resp Panel by RT-PCR (Flu A&B, Covid) Nasopharyngeal Swab     Status: None   Collection Time: 05/22/21  2:38 AM   Specimen: Nasopharyngeal Swab; Nasopharyngeal(NP) swabs in vial transport medium  Result Value Ref Range Status   SARS Coronavirus 2 by RT PCR NEGATIVE NEGATIVE Final    Comment: (NOTE) SARS-CoV-2 target nucleic acids are NOT DETECTED.  The SARS-CoV-2 RNA is generally detectable in upper respiratory specimens during the acute phase of infection. The lowest concentration of SARS-CoV-2 viral copies this assay can detect is 138 copies/mL. A negative result does not preclude SARS-Cov-2 infection and should not be used as the sole basis for treatment or other patient management decisions. A negative result may occur with  improper specimen collection/handling, submission of specimen other than nasopharyngeal swab, presence of viral mutation(s) within the areas targeted by this assay, and inadequate number of viral copies(<138 copies/mL). A negative result must be combined with clinical observations, patient history, and epidemiological information. The expected result is Negative.  Fact Sheet for Patients:  BloggerCourse.com  Fact Sheet for Healthcare Providers:   SeriousBroker.it  This test is no t yet approved or cleared by the [place] FDA and  has been authorized for detection and/or diagnosis of SARS-CoV-2 by FDA under an Emergency Use Authorization (EUA). This EUA will remain  in effect (meaning this test can be used) for the duration of the COVID-19 declaration under Section 564(b)(1) of the Act, 21 U.S.C.section 360bbb-3(b)(1), unless the authorization is terminated  or revoked sooner.       Influenza A by PCR NEGATIVE NEGATIVE Final   Influenza B by PCR NEGATIVE NEGATIVE Final    Comment: (NOTE) The Xpert Xpress SARS-CoV-2/FLU/RSV plus assay is intended as an aid in the diagnosis of influenza from Nasopharyngeal swab specimens and should not be used as a sole basis for treatment. Nasal washings and aspirates are unacceptable for Xpert Xpress SARS-CoV-2/FLU/RSV testing.  Fact Sheet for Patients: BloggerCourse.com  Fact Sheet for Healthcare Providers: SeriousBroker.it  This test is not yet approved or cleared by the [place] FDA and has been authorized for detection and/or diagnosis of SARS-CoV-2 by FDA under an Emergency Use Authorization (EUA). This EUA will remain in effect (meaning this test can be used) for the duration of the COVID-19 declaration under Section 564(b)(1) of the Act, 21 U.S.C. section 360bbb-3(b)(1), unless the authorization is terminated or revoked.  Performed at [facility], [address], [place], [place] [address]   Resp Panel by RT-PCR (Flu A&B, Covid) Nasopharyngeal Swab     Status: None   Collection Time: 05/25/21  4:58 AM   Specimen: Nasopharyngeal Swab; Nasopharyngeal(NP) swabs in vial transport medium  Result Value Ref Range Status   SARS Coronavirus 2 by RT PCR NEGATIVE NEGATIVE Final    Comment: (NOTE) SARS-CoV-2 target nucleic acids are NOT DETECTED.  The SARS-CoV-2 RNA is generally detectable in  upper respiratory specimens during the acute phase of infection. The lowest concentration of SARS-CoV-2 viral copies this assay can detect is 138 copies/mL. A negative result does not preclude SARS-Cov-2 infection and should not be used as the sole basis for treatment or other patient management decisions. A negative result may occur with  improper specimen collection/handling, submission of specimen other than nasopharyngeal swab, presence of viral mutation(s) within the areas targeted by this assay, and inadequate number of viral copies(<138 copies/mL). A negative result must be combined with clinical observations, patient history, and epidemiological information.  The expected result is Negative.  Fact Sheet for Patients:  BloggerCourse.com  Fact Sheet for Healthcare Providers:  SeriousBroker.it  This test is no t yet approved or cleared by the [place] FDA and  has been authorized for detection and/or diagnosis of SARS-CoV-2 by FDA under an Emergency Use Authorization (EUA). This EUA will remain  in effect (meaning this test can be used) for the duration of the COVID-19 declaration under Section 564(b)(1) of the Act, 21 U.S.C.section 360bbb-3(b)(1), unless the authorization is terminated  or revoked sooner.       Influenza A by PCR NEGATIVE NEGATIVE Final   Influenza B by PCR NEGATIVE NEGATIVE Final    Comment: (NOTE) The Xpert Xpress SARS-CoV-2/FLU/RSV plus assay is intended as an aid in the diagnosis of influenza from Nasopharyngeal swab specimens and should not be used as a sole basis for treatment. Nasal washings and aspirates are unacceptable for Xpert Xpress SARS-CoV-2/FLU/RSV testing.  Fact Sheet for Patients: BloggerCourse.com  Fact Sheet for Healthcare Providers: SeriousBroker.it  This test is not yet approved or cleared by the [place] FDA and has been  authorized for detection and/or diagnosis of SARS-CoV-2 by FDA under an Emergency Use Authorization (EUA). This EUA will remain in effect (meaning this test can be used) for the duration of the COVID-19 declaration under Section 564(b)(1) of the Act, 21 U.S.C. section 360bbb-3(b)(1), unless the authorization is terminated or revoked.  Performed at [facility], [address] [place], [place] [address]   C Difficile Quick Screen w PCR reflex     Status: None   Collection Time: 05-Jun-2021  1:28 AM   Specimen: STOOL  Result Value Ref Range Status   C Diff antigen NEGATIVE NEGATIVE Final   C Diff toxin NEGATIVE NEGATIVE Final   C Diff interpretation No C. difficile detected.  Final    Comment: Performed at [facility], [address] [place], [place] [address]       Radiology Studies: EEG adult  Result Date: 2021/06/05 Charlsie Quest, MD     June 05, 2021 10:23 AM Patient Name: Jahan Friedlander MRN: [id number] Epilepsy Attending: Charlsie Quest Referring Physician/Provider: Dr Marisue Humble Date: 2021-06-05 Duration: 23.40 mins  Patient history:  35 y.o. male with transient left weakness and slurred speech. EEG to evaluate for seizure.  Level of alertness: Awake  AEDs during EEG study: None  Technical aspects: This EEG study was done with scalp electrodes positioned according to the 10-20 International system of electrode placement. Electrical activity was acquired at a sampling rate of 500Hz  and reviewed with a high frequency filter of 70Hz  and a low frequency filter of 1Hz . EEG data were recorded continuously and digitally stored.  Description: The posterior dominant rhythm consists of 10 Hz activity of moderate voltage (25-35 uV) seen predominantly in posterior head regions, symmetric and reactive to eye opening and eye closing. Hyperventilation and photic stimulation were not performed.    IMPRESSION: This study is within normal limits. No seizures or epileptiform discharges  were seen throughout the recording. If concern for ictal-interictal activity persists, consider long-term EEG monitoring.  Priyanka O Yadav   VAS TRANSCRANIAL DOPPLER W BUBBLES  Result Date: 05/25/2021  Transcranial Doppler with Bubble Patient Name:  [facility]  Date of Exam:   05/25/2021 Medical Rec #: 05/27/2021       Accession #:    [id number] Date of Birth: [birth year]       Patient Gender: M Patient Age:  35 years Exam Location:  [facility] Procedure:      VAS [place] TRANSCRANIAL DOPPLER W BUBBLES Referring Phys: Angelique Blonder WOLFE --------------------------------------------------------------------------------  Indications: Stroke. Comparison Study: No prior studies. Performing Technologist: Chanda Busing RVT  Examination Guidelines: A complete evaluation includes B-mode imaging, spectral Doppler, color Doppler, and power Doppler as needed of all accessible portions of each vessel. Bilateral testing is considered an integral part of a complete examination. Limited examinations for reoccurring indications may be performed as noted.  Summary:  A vascular evaluation was performed. The right middle cerebral artery was studied. An IV was inserted into the patient's left Cephalic vein. Verbal informed consent was obtained.  Negative for PFO. NEGATIVE TCD Bubble study with no evidence of right to left shunt noted. *See table(s) above for TCD measurements and observations.  Diagnosing physician: Delia Heady MD Electronically signed by Delia Heady MD on 05/25/2021 at 3:48:47 PM.    Final    VAS [place] LOWER EXTREMITY VENOUS (DVT)  Result Date: 05/25/2021  Lower Venous DVT Study Patient Name:  [facility]  Date of Exam:   05/25/2021 Medical Rec #: [id number]       Accession #:    [id number] Date of Birth: 1986/07/09       Patient Gender: M Patient Age:   18 years Exam Location:  [facility] Procedure:      VAS [place] LOWER EXTREMITY VENOUS (DVT) Referring Phys: Gevena Mart  --------------------------------------------------------------------------------  Indications: Stroke.  Risk Factors: None identified. Limitations: Poor ultrasound/tissue interface and body habitus. Comparison Study: No prior studies. Performing Technologist: Chanda Busing RVT  Examination Guidelines: A complete evaluation includes B-mode imaging, spectral Doppler, color Doppler, and power Doppler as needed of all accessible portions of each vessel. Bilateral testing is considered an integral part of a complete examination. Limited examinations for reoccurring indications may be performed as noted. The reflux portion of the exam is performed with the patient in reverse Trendelenburg.  +---------+---------------+---------+-----------+----------+--------------+ RIGHT    CompressibilityPhasicitySpontaneityPropertiesThrombus Aging +---------+---------------+---------+-----------+----------+--------------+ CFV      Full           Yes      Yes                                 +---------+---------------+---------+-----------+----------+--------------+ SFJ      Full                                                        +---------+---------------+---------+-----------+----------+--------------+ FV Prox  Full                                                        +---------+---------------+---------+-----------+----------+--------------+ FV Mid   Full                                                        +---------+---------------+---------+-----------+----------+--------------+ FV DistalFull                                                        +---------+---------------+---------+-----------+----------+--------------+  PFV      Full                                                        +---------+---------------+---------+-----------+----------+--------------+ POP      Full           Yes      Yes                                  +---------+---------------+---------+-----------+----------+--------------+ PTV      Full                                                        +---------+---------------+---------+-----------+----------+--------------+ PERO     Full                                                        +---------+---------------+---------+-----------+----------+--------------+   +---------+---------------+---------+-----------+----------+--------------+ LEFT     CompressibilityPhasicitySpontaneityPropertiesThrombus Aging +---------+---------------+---------+-----------+----------+--------------+ CFV      Full           Yes      Yes                                 +---------+---------------+---------+-----------+----------+--------------+ SFJ      Full                                                        +---------+---------------+---------+-----------+----------+--------------+ FV Prox  Full                                                        +---------+---------------+---------+-----------+----------+--------------+ FV Mid   Full                                                        +---------+---------------+---------+-----------+----------+--------------+ FV DistalFull                                                        +---------+---------------+---------+-----------+----------+--------------+ PFV      Full                                                        +---------+---------------+---------+-----------+----------+--------------+  POP      Full           Yes      Yes                                 +---------+---------------+---------+-----------+----------+--------------+ PTV      Full                                                        +---------+---------------+---------+-----------+----------+--------------+ PERO     Full                                                         +---------+---------------+---------+-----------+----------+--------------+     Summary: RIGHT: - There is no evidence of deep vein thrombosis in the lower extremity. However, portions of this examination were limited- see technologist comments above.  - No cystic structure found in the popliteal fossa.  LEFT: - There is no evidence of deep vein thrombosis in the lower extremity. However, portions of this examination were limited- see technologist comments above.  - No cystic structure found in the popliteal fossa.  *See table(s) above for measurements and observations. Electronically signed by Waverly Ferrari MD on 05/25/2021 at 2:38:24 PM.    Final       Scheduled Meds:  aspirin  325 mg Oral Daily   atorvastatin  80 mg Oral Daily   clopidogrel  75 mg Oral Daily   enoxaparin (LOVENOX) injection  40 mg Subcutaneous Q24H   sodium chloride flush  3 mL Intravenous Q12H   Continuous Infusions:  sodium chloride       LOS: 0 days      Time spent: 25 minutes   Noralee Stain, DO [facility] 05/27/2021, 11:35 AM   Available via Epic secure chat 7am-7pm After these hours, please refer to coverage provider listed on [email or web address]

## 2021-05-27 NOTE — Progress Notes (Signed)
STROKE TEAM PROGRESS NOTE   INTERVAL HISTORY He is sitting in the bedside chair. Wife is in the room. Has weakness on left hand and speech is not back to normal.  TEE is  scheduled for tomorrow.  Vitals:   05/27/21 0000 05/27/21 0445 05/27/21 0900 05/27/21 1217  BP: 126/70 138/88 137/90 (!) 136/96  Pulse:   70 66  Resp: 18 18 18 17   Temp: 97.9 F (36.6 C) 97.9 F (36.6 C) 98.3 F (36.8 C) 98.5 F (36.9 C)  TempSrc: Oral Oral Oral Oral  SpO2: 99%  95%   Weight:      Height:       CBC:  Recent Labs  Lab 05/23/21 0150 05/24/21 1848 05/24/21 1855 05/26/21 0111 05/27/21 0355  WBC 11.1* 15.1*   < > 14.6* 12.5*  NEUTROABS 5.5 8.8*  --   --   --   HGB 14.1 14.6   < > 14.5 13.7  HCT 41.9 43.9   < > 43.2 41.1  MCV 85.9 85.6   < > 85.9 84.9  PLT 287 313   < > 305 284   < > = values in this interval not displayed.   Basic Metabolic Panel:  Recent Labs  Lab 05/25/21 0500 05/26/21 0111  NA 136 135  K 3.6 3.8  CL 105 105  CO2 22 20*  GLUCOSE 104* 93  BUN 10 12  CREATININE 1.02 0.97  CALCIUM 8.9 8.7*   Lipid Panel:  Recent Labs  Lab 05/22/21 0845  CHOL 186  TRIG 88  HDL 48  CHOLHDL 3.9  VLDL 18  LDLCALC 05/24/21*   HgbA1c:  Recent Labs  Lab 05/22/21 0845  HGBA1C 5.5   Urine Drug Screen:  Recent Labs  Lab 05/25/21 0307  LABOPIA NONE DETECTED  COCAINSCRNUR NONE DETECTED  LABBENZ NONE DETECTED  AMPHETMU NONE DETECTED  THCU NONE DETECTED  LABBARB NONE DETECTED    Alcohol Level  Recent Labs  Lab 05/24/21 1848  ETH <10    IMAGING past 24 hours No results found.  PHYSICAL EXAM  Temp:  [97.7 F (36.5 C)-98.5 F (36.9 C)] 98.5 F (36.9 C) (10/25 1217) Pulse Rate:  [65-70] 66 (10/25 1217) Resp:  [17-20] 17 (10/25 1217) BP: (120-138)/(70-96) 136/96 (10/25 1217) SpO2:  [95 %-99 %] 95 % (10/25 0900)  General -obese 10-03-1981 young male, in no apparent distress.  Ophthalmologic - fundi not visualized due to noncooperation.  Cardiovascular - Regular  rhythm and rate.  Mental Status -  Level of arousal and orientation to time, place, and person   Cranial Nerves II - XII - PERRL, EOMI. Face symmetric.   Motor Strength - left arm weaker 4/5, proximally with significant weakness of left grip and intrinsic hand muscles noted. LLE, RUE, RLE 5/5  Bulk was normal and fasciculations were absent.    Sensory - intact to LT.  Coordination - no ataxia.  Gait and Station - deferred.   ASSESSMENT/PLAN [name] is a 35 y.o. 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 Patient was admitted from October 20 to October 21 after he had a stroke.  He had an echo cardiogram at that time an MRI showed CVA.  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   CT head Subacute appearing right basal ganglia and corona radiata infarction. 2D Echo EF 60-65%. No atrial shunt   TEE pending lower extremities negative for DVT TCD with bubble study negative for right-to-left shunt EEG no seizure activity UDS negative  LDL 120 HgbA1c 5.5 VTE prophylaxis - Lovenox and SCD's    Diet   Diet Heart Room service appropriate? Yes; Fluid consistency: Thin   Diet NPO time specified   aspirin 325 mg daily and clopidogrel 75 mg daily prior to admission, now on aspirin 325 mg daily and clopidogrel 75 mg daily.  Therapy recommendations:  pending  Disposition:  home  Hypertension Home meds:  none Stable Permissive hypertension (OK if < 220/120) but gradually normalize in 5-7 days Long-term BP goal normotensive  Hyperlipidemia Home meds:  atorvastatin 80, resumed in hospital LDL 120, goal < 70 Continue statin at  discharge  Tobacco abuse Current smoker Smoking cessation counseling provided Pt is willing to quit  Other Stroke Risk Factors Cigarette smoker advised to stop smoking Obesity, Body mass index is 42.14 kg/m., BMI >/= 30 associated with increased stroke risk, recommend weight loss, diet and exercise as appropriate  Hx stroke/TIA Likely Obstructive sleep apnea, will need outpatient sleep study   Hospital day # 3    large subcortical right brain infarct.  Repeat MRI does not show significant change  TEE for cardiac source of embolism tomorrow.  Outpatient sleep study for obstructive sleep apnea.  Continue dual antiplatelet therapy and aggressive risk factor modification.   Follow results of anticardiolipin antibodies and ANA will need outpatient sleep study outpt.    Total of 30 mins spent reviewing chart, discussion with patient and family on prognosis, Dx and plan. Discussed case with patient's nurse. Reviewed Imaging personally.   To contact Stroke Continuity provider, please refer to [email or web address]. After hours, contact General Neurology

## 2021-05-28 ENCOUNTER — Encounter (HOSPITAL_COMMUNITY)
Admission: EM | Disposition: A | Discharge status: Home / Self Care | Payer: Self-pay | Source: Home / Self Care | Attending: Internal Medicine

## 2021-05-28 ENCOUNTER — Encounter (HOSPITAL_COMMUNITY): Payer: Self-pay | Admitting: Internal Medicine

## 2021-05-28 ENCOUNTER — Inpatient Hospital Stay (HOSPITAL_COMMUNITY): Payer: 59 | Admitting: Anesthesiology

## 2021-05-28 ENCOUNTER — Other Ambulatory Visit (HOSPITAL_COMMUNITY): Payer: Self-pay

## 2021-05-28 ENCOUNTER — Inpatient Hospital Stay (HOSPITAL_COMMUNITY): Payer: 59

## 2021-05-28 DIAGNOSIS — Q2112 Patent foramen ovale: Secondary | ICD-10-CM

## 2021-05-28 DIAGNOSIS — I639 Cerebral infarction, unspecified: Secondary | ICD-10-CM

## 2021-05-28 DIAGNOSIS — I63511 Cerebral infarction due to unspecified occlusion or stenosis of right middle cerebral artery: Secondary | ICD-10-CM | POA: Diagnosis not present

## 2021-05-28 HISTORY — PX: BUBBLE STUDY: SHX6837

## 2021-05-28 HISTORY — PX: TEE WITHOUT CARDIOVERSION: SHX5443

## 2021-05-28 LAB — COMPREHENSIVE METABOLIC PANEL
ALT: 95 U/L — ABNORMAL HIGH (ref 0–44)
AST: 66 U/L — ABNORMAL HIGH (ref 15–41)
Albumin: 3.1 g/dL — ABNORMAL LOW (ref 3.5–5.0)
Alkaline Phosphatase: 81 U/L (ref 38–126)
Anion gap: 8 (ref 5–15)
BUN: 8 mg/dL (ref 6–20)
CO2: 24 mmol/L (ref 22–32)
Calcium: 8.7 mg/dL — ABNORMAL LOW (ref 8.9–10.3)
Chloride: 103 mmol/L (ref 98–111)
Creatinine, Ser: 0.96 mg/dL (ref 0.61–1.24)
GFR, Estimated: >60 mL/min (ref >60)
Glucose, Bld: 91 mg/dL (ref 70–99)
Potassium: 3.5 mmol/L (ref 3.5–5.1)
Sodium: 135 mmol/L (ref 135–145)
Total Bilirubin: 0.7 mg/dL (ref 0.3–1.2)
Total Protein: 6.9 g/dL (ref 6.5–8.1)

## 2021-05-28 LAB — CBC
HCT: 40.8 % (ref 39.0–52.0)
Hemoglobin: 13.6 g/dL (ref 13.0–17.0)
MCH: 28.3 pg (ref 26.0–34.0)
MCHC: 33.3 g/dL (ref 30.0–36.0)
MCV: 84.8 fL (ref 80.0–100.0)
Platelets: 283 10*3/uL (ref 150–400)
RBC: 4.81 MIL/uL (ref 4.22–5.81)
RDW: 13.1 % (ref 11.5–15.5)
WBC: 11 10*3/uL — ABNORMAL HIGH (ref 4.0–10.5)
nRBC: 0 % (ref 0.0–0.2)

## 2021-05-28 LAB — CARDIOLIPIN ANTIBODIES, IGG, IGM, IGA
Anticardiolipin IgA: <9 APL U/mL (ref 0–11)
Anticardiolipin IgG: <9 GPL U/mL (ref 0–14)
Anticardiolipin IgM: <9 MPL U/mL (ref 0–12)

## 2021-05-28 SURGERY — ECHOCARDIOGRAM, TRANSESOPHAGEAL
Anesthesia: Monitor Anesthesia Care

## 2021-05-28 MED ORDER — CLOPIDOGREL BISULFATE 75 MG PO TABS
75.0000 mg | ORAL_TABLET | Freq: Every day | ORAL | 3 refills | Status: DC
  Filled 2021-05-28: qty 30, 30d supply, fill #0

## 2021-05-28 MED ORDER — SODIUM CHLORIDE 0.9 % IV SOLN: INTRAVENOUS | Status: DC

## 2021-05-28 MED ORDER — ATORVASTATIN CALCIUM 80 MG PO TABS
80.0000 mg | ORAL_TABLET | Freq: Every day | ORAL | 3 refills | Status: DC
  Filled 2021-05-28: qty 30, 30d supply, fill #0

## 2021-05-28 MED ORDER — ASPIRIN 325 MG PO TBEC
325.0000 mg | DELAYED_RELEASE_TABLET | Freq: Every day | ORAL | 3 refills | Status: DC
  Filled 2021-05-28: qty 30, 30d supply, fill #0

## 2021-05-28 MED ORDER — PROPOFOL 500 MG/50ML IV EMUL
INTRAVENOUS | Status: DC | PRN
  Administered 2021-05-28: 125 ug/kg/min via INTRAVENOUS
  Administered 2021-05-28: 20 ug via INTRAVENOUS
  Administered 2021-05-28: 40 ug via INTRAVENOUS
  Administered 2021-05-28: 20 ug via INTRAVENOUS

## 2021-05-28 NOTE — CV Procedure (Signed)
Brief TEE Note  LVEF 60-65% No LA/LAA thrombus or masses Small PFO with R-->L flow at rest.  For additional details see full report.  Ernest [name] C. Duke Salvia, MD, [facility] 05/28/2021 12:54 PM

## 2021-05-28 NOTE — Interval H&P Note (Signed)
History and Physical Interval Note:  05/28/2021 12:56 PM  Ernest [name]  has presented today for surgery, with the diagnosis of STROKE.  The various methods of treatment have been discussed with the patient and family. After consideration of risks, benefits and other options for treatment, the patient has consented to  Procedure(s): TRANSESOPHAGEAL ECHOCARDIOGRAM (TEE) (N/A) BUBBLE STUDY as a surgical intervention.  The patient's history has been reviewed, patient examined, no change in status, stable for surgery.  I have reviewed the patient's chart and labs.  Questions were answered to the patient's satisfaction.     Chilton Si, MD

## 2021-05-28 NOTE — Progress Notes (Signed)
Pt is out for procedure.

## 2021-05-28 NOTE — TOC Transition Note (Addendum)
Transition of Care [facility]) - CM/SW Discharge Note   Patient Details  Name: Ernest [name] MRN: [id number] Date of Birth: 1986-06-07  [facility] CM/SW Contact:  Harriet Masson, RN Phone Number: 05/28/2021, 3:50 PM   Clinical Narrative:    Patient stable for discharge. Orders for new CPAP. Sleep study done 11/2019. Patient agreeable to use [employer] for DME needs. Spoke to [place] with [employer]. CPAP will be delivered to patient's home.  Address, Phone number and PCP verified (new patient apt with Dr. Andree Coss) Patient has transportation home    Final next level of care: Home/Self Care Barriers to Discharge: Barriers Resolved   Patient Goals and CMS Choice Patient states their goals for this hospitalization and ongoing recovery are:: return home      Discharge Placement                       Discharge Plan and Services   Discharge Planning Services: CM Consult Post Acute Care Choice: Durable Medical Equipment          DME Arranged: Continuous positive airway pressure (CPAP) DME Agency: [employer] Date DME Agency Contacted: 05/28/21 Time DME Agency Contacted: 1500 Representative spoke with at DME Agency: Silvio Pate            Social Determinants of Health (SDOH) Interventions     Readmission Risk Interventions Readmission Risk Prevention Plan 05/28/2021  Post Dischage Appt Complete  Medication Screening Complete  Transportation Screening Complete  Some recent data might be hidden

## 2021-05-28 NOTE — Transfer of Care (Signed)
Immediate Anesthesia Transfer of Care Note  Patient: Ernest [name]  Procedure(s) Performed: TRANSESOPHAGEAL ECHOCARDIOGRAM (TEE) BUBBLE STUDY  Patient Location: PACU  Anesthesia Type:MAC  Level of Consciousness: drowsy  Airway & Oxygen Therapy: Patient Spontanous Breathing  Post-op Assessment: Report given to RN and Post -op Vital signs reviewed and stable  Post vital signs: Reviewed and stable  Last Vitals:  Vitals Value Taken Time  BP 120/82 05/28/21 1301  Temp    Pulse 76 05/28/21 1306  Resp 19 05/28/21 1306  SpO2 98 % 05/28/21 1306  Vitals shown include unvalidated device data.  Last Pain:  Vitals:   05/28/21 1200  TempSrc: Temporal  PainSc: 0-No pain         Complications: No notable events documented.

## 2021-05-28 NOTE — Anesthesia Postprocedure Evaluation (Signed)
Anesthesia Post Note  Patient: Ernest [name]  Procedure(s) Performed: TRANSESOPHAGEAL ECHOCARDIOGRAM (TEE) BUBBLE STUDY     Patient location during evaluation: PACU Anesthesia Type: MAC Level of consciousness: awake and alert and oriented Pain management: pain level controlled Vital Signs Assessment: post-procedure vital signs reviewed and stable Respiratory status: spontaneous breathing, nonlabored ventilation and respiratory function stable Cardiovascular status: blood pressure returned to baseline Postop Assessment: no apparent nausea or vomiting Anesthetic complications: no   No notable events documented.  Last Vitals:  Vitals:   05/28/21 1315 05/28/21 1330  BP: (!) 132/95 140/86  Pulse: 65 67  Resp: 15 10  Temp:    SpO2: 97% 96%    Last Pain:  Vitals:   05/28/21 1200  TempSrc: Temporal  PainSc: 0-No pain                 Marthenia Rolling

## 2021-05-28 NOTE — Progress Notes (Signed)
  Echocardiogrfam Echocardiogram Transesophageal has been performed.  Leta Jungling M 05/28/2021, 3:18 PM

## 2021-05-28 NOTE — Discharge Summary (Addendum)
PATIENT DETAILS Name: Ernest [name] Age: 35 y.o. Sex: male Date of Birth: March 01, 1986 MRN: [id number]. Admitting Physician: Noralee Stain, DO HDQ:[id number], Judeth Cornfield, NP  Admit Date: 05/24/2021 Discharge date: 05/28/2021  Recommendations for Outpatient Follow-up:  Follow up with PCP in 1-2 weeks Please obtain CMP/CBC in one week Please ensure follow-up with stroke clinic. Please ensure compliance with CPAP  Admitted From:  Home  Disposition: Home   Home Health: No  Equipment/Devices: None  Discharge Condition: Stable  CODE STATUS: FULL CODE  Diet recommendation:  Diet Order             Diet - low sodium heart healthy                    Brief Summary: See H&P, Labs, Consult and Test reports for all details in brief, patient is a 35 year old male with history of OSA-noncompliant with CPAP, tobacco use-who was just discharged from the hospital on 10/21 after being diagnosed/treated for acute CVA-upon returning home-he had worsening of his left hand/left facial numbness-and hence returned back to the emergency room.  He was subsequently admitted for further evaluation and treatment.  See below for further details  Brief Hospital Course: Cryptogenic right basal ganglia subacute CVA: Felt to be cryptogenic-continues to have some mild numbness in his left face-and mild left upper extremity weakness.  Admitted and underwent extensive work-up including neurology evaluation.  CTA head/neck did not show any significant stenosis, TEE showed a small PFO but no thrombus.  LDL was 120, A1c was 5.5.  ANA, anticardiolipin antibodies were negative.  Patient was counseled extensively regarding importance of completely stopping tobacco use.  He has been noncompliant to CPAP (never picked up his CPAP after being diagnosed with OSA approximately 1 year back)-after discussion with case management-efforts are ongoing to see if we can get him a CPAP to go home with.  Recommendations from  neurology are to continue dual antiplatelet agents/statin on discharge.  Patient to follow-up with outpatient stroke clinic in 8 weeks.    Tobacco abuse: Counseled extensively regarding importance of quitting tobacco use.  Transaminitis: Appears to be mild-please continue to follow-up closely in the outpatient setting.  Acute hepatitis serology was negative-limited RUQ ultrasound showed hepatic steatosis without any other abnormalities.  OSA: Sleep study performed at [facility] approximately a year Radiographer, therapeutic consulted to arrange for CPAP if possible.  Patient aware that if we are unable to arrange for CPAP, he will need to follow-up with his primary care practitioner and get a repeat outpatient sleep study and CPAP arranged.  Morbid Obesity: Counseled regarding importance of weight loss. Estimated body mass index is 42.14 kg/m as calculated from the following:   Height as of this encounter: 5\' 3"  (1.6 m).   Weight as of this encounter: 107.9 kg.     Procedures 10/26>>TEE  Discharge Diagnoses:  Principal Problem:   CVA (cerebral vascular accident) (HCC) Active Problems:   Obesity, Class III, BMI 40-49.9 (morbid obesity) (HCC)   Leukocytosis   Hypocalcemia   Transaminitis   Paresthesia   Discharge Instructions:  Activity:  As tolerated   Discharge Instructions     Ambulatory referral to Neurology   Complete by: As directed    An appointment is requested in approximately: 8 weeks   Diet - low sodium heart healthy   Complete by: As directed    Increase activity slowly   Complete by: As directed       Allergies as of  05/28/2021   No Known Allergies      Medication List     TAKE these medications    aspirin 325 MG EC tablet Take 1 tablet (325 mg total) by mouth daily.   atorvastatin 80 MG tablet Commonly known as: LIPITOR Take 1 tablet (80 mg total) by mouth daily.   clopidogrel 75 MG tablet Commonly known as: PLAVIX Take 1 tablet  (75 mg total) by mouth daily.               Durable Medical Equipment  (From admission, onward)           Start     Ordered   05/28/21 1525  For home use only DME continuous positive airway pressure (CPAP)  Once       Question Answer Comment  Length of Need Lifetime   Patient has OSA or probable OSA Yes   Is the patient currently using CPAP in the home No   Settings Autotitration   CPAP supplies needed Mask, headgear, cushions, filters, heated tubing and water chamber      05/28/21 1525            Follow-up Information     [facility] Follow up.   Why: They will contact you to deliver equipment Contact information: [address] [place] [address] [phone]         [facility] Follow up on 06/16/2021.   Why: apt [address] with Dr. Andree Coss, Hospital follow up Contact information: [address] [place]  [place], [place] [address] [phone]        [facility] Follow up.   Why: Hospital follow up, Office will call with date/time, If you dont hear from them,please give them a call Contact information: [address]     [address] [place] [address] [phone]               No Known Allergies    Consultations:  neurology    Other Procedures/Studies: CT HEAD WO CONTRAST  Result Date: 05/24/2021 CLINICAL DATA:  MRI head 05/22/2021, CT head 05/22/2021 EXAM: CT HEAD WITHOUT CONTRAST TECHNIQUE: Contiguous axial images were obtained from the base of the skull through the vertex without intravenous contrast. COMPARISON:  CT head 05/22/2021, MR head 05/22/2021 FINDINGS: Brain: Subacute appearing right basal ganglia and corona radiata infarction. No evidence of large-territorial acute infarction. No parenchymal hemorrhage. No mass lesion. No extra-axial collection. No mass effect or midline shift. No hydrocephalus. Basilar cisterns are patent. Vascular: No hyperdense vessel. Skull: No  acute fracture or focal lesion. Sinuses/Orbits: Bilateral maxillary, sphenoid, ethmoid, frontal mucosal thickening. Right mastoid air cell destruction and effusion with fluid noted within the right middle ear. The orbits are unremarkable. Other: None. IMPRESSION: 1. Subacute appearing right basal ganglia and corona radiata infarction. 2. Pansinusitis as well as right mastoid air cell effusion and fluid within the right middle ear. Correlate with infection. Electronically Signed   By: Tish Frederickson M.D.   On: 05/24/2021 20:10   MR ANGIO HEAD WO CONTRAST  Result Date: 05/22/2021 CLINICAL DATA:  Stroke, follow-up. Additional history provided: Left arm weakness and transient shaking, possible left facial droop. EXAM: MRI HEAD WITHOUT AND WITH CONTRAST MRA HEAD WITHOUT CONTRAST TECHNIQUE: Multiplanar, multi-echo pulse sequences of the brain and surrounding structures were acquired without and with intravenous contrast. Angiographic images of the Circle of Willis were acquired using MRA technique without intravenous contrast. CONTRAST:  52mL GADAVIST GADOBUTROL 1 MMOL/ML IV  SOLN COMPARISON:  Noncontrast head CT and CT angiogram head/neck 05/22/2021. FINDINGS: MRI HEAD FINDINGS Brain: Cerebral volume is normal. Acute infarct measuring 3.4 cm in greatest dimension within the right caudate and lentiform nuclei and right corona radiata. Background multifocal T2 FLAIR hyperintense signal abnormality within the bilateral cerebral white matter, overall mild but significantly advanced for age. The hippocampi are symmetric in size and signal. No evidence of an intracranial mass. No chronic intracranial blood products. No extra-axial fluid collection. No midline shift. No pathologic intracranial enhancement identified. Vascular: Reported below. Skull and upper cervical spine: No focal suspicious marrow lesion. Sinuses/Orbits: Rightward gaze. Complete T2 hyperintense opacification of the right frontal sinus. Extensive T2  hyperintense opacification of the left frontal sinus. Scattered mucosal thickening and fluid within the left greater than right ethmoid air cells. Frothy secretions and mild mucosal thickening within the right sphenoid sinus. Mild mucosal thickening within the left sphenoid sinus. Extensive partial opacification of the bilateral maxillary sinuses secondary to the presence mucosal thickening and large fluid levels. Other: Fluid within the right middle ear/mastoid air cells. Trace fluid also present within the left mastoid air cells. MRA HEAD FINDINGS Anterior circulation: The intracranial internal carotid arteries are patent. The M1 middle cerebral arteries are patent. Redemonstrated moderate stenosis within the distal M1 right middle cerebral artery. No M2 proximal branch occlusion or high-grade proximal stenosis is identified. The anterior cerebral arteries are patent. No intracranial aneurysm is identified. Posterior circulation: The intracranial vertebral arteries are patent. Dominant right vertebral artery. The basilar artery is patent. The posterior cerebral arteries are patent. A sizable left posterior communicating artery is present. The right posterior communicating artery is diminutive or absent. Anatomic variants: As described. IMPRESSION: MRI brain: 1. 3.4 cm acute infarct within the right basal ganglia and right corona radiata. 2. Background multifocal T2 FLAIR hyperintense signal abnormality within the bilateral cerebral white matter, overall mild but significantly advanced for age. These signal changes are nonspecific and differential considerations include age-advanced chronic small vessel ischemic disease, sequela of chronic migraine headaches, sequela of a prior infectious/inflammatory process or less likely sequela of a demyelinating process (such as multiple sclerosis), among others. 3. Extensive paranasal sinus disease, as described. Correlate for acute sinusitis. 4. Right greater than left  middle ear/mastoid effusions. MRA head: 1. No intracranial large vessel occlusion is identified. 2. Redemonstrated moderate stenosis within the distal M1 segment of the right middle cerebral artery. Electronically Signed   By: Jackey Loge D.O.   On: 05/22/2021 13:32   MR BRAIN WO CONTRAST  Result Date: 05/25/2021 CLINICAL DATA:  35 year old male with neurologic deficit. Right basal ganglia lacunar infarct 3 days ago. EXAM: MRI HEAD WITHOUT CONTRAST TECHNIQUE: Multiplanar, multiecho pulse sequences of the brain and surrounding structures were obtained without intravenous contrast. COMPARISON:  Brain MRI and intracranial MRA 05/22/2021. FINDINGS: Brain: Slightly increased size and conspicuity of restricted diffusion tracking from the body of the right caudate nucleus into the lentiform. But no significant extension into surrounding tissue from 3 days ago. Compare series 7, image 54 today to series 3, image 22 previously. Associated T2 and FLAIR hyperintense cytotoxic edema. No hemorrhage. No mass effect. No other restricted diffusion. No midline shift, mass effect, evidence of mass lesion, ventriculomegaly, extra-axial collection or acute intracranial hemorrhage. Cervicomedullary junction and pituitary are within normal limits. Stable gray and white matter signal elsewhere. No chronic cortical encephalomalacia or chronic cerebral blood products identified. Vascular: Major intracranial vascular flow voids are stable. Skull and upper cervical spine: Stable. Sinuses/Orbits:  Mildly Disconjugate gaze, otherwise negative orbits. Bilateral paranasal sinus disease is unchanged including opacification and fluid levels. Other: Right greater than left mastoid air cell and/or middle ear fluid appears stable. Symmetric adenoid hypertrophy redemonstrated. Other visible scalp and face soft tissues appear negative. IMPRESSION: 1. Acute lacunar infarct of the right basal ganglia with mildly increased diffusion restriction over  the past three days. But no significant regional extension. No associated hemorrhage or mass effect. 2. No new intracranial abnormality. 3. Bilateral paranasal sinus, middle ear, and mastoid inflammation. Electronically Signed   By: Odessa Fleming M.D.   On: 05/25/2021 04:32   MR BRAIN W WO CONTRAST  Result Date: 05/22/2021 CLINICAL DATA:  Stroke, follow-up. Additional history provided: Left arm weakness and transient shaking, possible left facial droop. EXAM: MRI HEAD WITHOUT AND WITH CONTRAST MRA HEAD WITHOUT CONTRAST TECHNIQUE: Multiplanar, multi-echo pulse sequences of the brain and surrounding structures were acquired without and with intravenous contrast. Angiographic images of the Circle of Willis were acquired using MRA technique without intravenous contrast. CONTRAST:  22mL GADAVIST GADOBUTROL 1 MMOL/ML IV SOLN COMPARISON:  Noncontrast head CT and CT angiogram head/neck 05/22/2021. FINDINGS: MRI HEAD FINDINGS Brain: Cerebral volume is normal. Acute infarct measuring 3.4 cm in greatest dimension within the right caudate and lentiform nuclei and right corona radiata. Background multifocal T2 FLAIR hyperintense signal abnormality within the bilateral cerebral white matter, overall mild but significantly advanced for age. The hippocampi are symmetric in size and signal. No evidence of an intracranial mass. No chronic intracranial blood products. No extra-axial fluid collection. No midline shift. No pathologic intracranial enhancement identified. Vascular: Reported below. Skull and upper cervical spine: No focal suspicious marrow lesion. Sinuses/Orbits: Rightward gaze. Complete T2 hyperintense opacification of the right frontal sinus. Extensive T2 hyperintense opacification of the left frontal sinus. Scattered mucosal thickening and fluid within the left greater than right ethmoid air cells. Frothy secretions and mild mucosal thickening within the right sphenoid sinus. Mild mucosal thickening within the left  sphenoid sinus. Extensive partial opacification of the bilateral maxillary sinuses secondary to the presence mucosal thickening and large fluid levels. Other: Fluid within the right middle ear/mastoid air cells. Trace fluid also present within the left mastoid air cells. MRA HEAD FINDINGS Anterior circulation: The intracranial internal carotid arteries are patent. The M1 middle cerebral arteries are patent. Redemonstrated moderate stenosis within the distal M1 right middle cerebral artery. No M2 proximal branch occlusion or high-grade proximal stenosis is identified. The anterior cerebral arteries are patent. No intracranial aneurysm is identified. Posterior circulation: The intracranial vertebral arteries are patent. Dominant right vertebral artery. The basilar artery is patent. The posterior cerebral arteries are patent. A sizable left posterior communicating artery is present. The right posterior communicating artery is diminutive or absent. Anatomic variants: As described. IMPRESSION: MRI brain: 1. 3.4 cm acute infarct within the right basal ganglia and right corona radiata. 2. Background multifocal T2 FLAIR hyperintense signal abnormality within the bilateral cerebral white matter, overall mild but significantly advanced for age. These signal changes are nonspecific and differential considerations include age-advanced chronic small vessel ischemic disease, sequela of chronic migraine headaches, sequela of a prior infectious/inflammatory process or less likely sequela of a demyelinating process (such as multiple sclerosis), among others. 3. Extensive paranasal sinus disease, as described. Correlate for acute sinusitis. 4. Right greater than left middle ear/mastoid effusions. MRA head: 1. No intracranial large vessel occlusion is identified. 2. Redemonstrated moderate stenosis within the distal M1 segment of the right middle cerebral artery. Electronically Signed  By: Jackey Loge D.O.   On: 05/22/2021 13:32    DG CHEST PORT 1 VIEW  Result Date: 05/25/2021 CLINICAL DATA:  LEFT-sided weakness EXAM: PORTABLE CHEST 1 VIEW COMPARISON:  May 22, 2021 FINDINGS: Evaluation is limited by patient rotation. The cardiomediastinal silhouette is unchanged in contour. No pleural effusion. No pneumothorax. No acute pleuroparenchymal abnormality. Visualized abdomen is unremarkable. IMPRESSION: No acute cardiopulmonary abnormality. Electronically Signed   By: Meda Klinefelter M.D.   On: 05/25/2021 09:44   DG CHEST PORT 1 VIEW  Result Date: 05/22/2021 CLINICAL DATA:  TIA, weakness EXAM: PORTABLE CHEST 1 VIEW COMPARISON:  None. FINDINGS: The cardiomediastinal silhouette is normal. There is no focal consolidation or pulmonary edema. There is no pleural effusion or pneumothorax. There is no acute osseous abnormality. IMPRESSION: No radiographic evidence of acute cardiopulmonary process. Electronically Signed   By: Lesia Hausen M.D.   On: 05/22/2021 08:48   EEG adult  Result Date: 05/26/2021 Charlsie Quest, MD     05/26/2021 10:23 AM Patient Name: Alvia Tory MRN: [id number] Epilepsy Attending: Charlsie Quest Referring Physician/Provider: Dr Marisue Humble Date: 05/26/2021 Duration: 23.40 mins  Patient history:  35 y.o. male with transient left weakness and slurred speech. EEG to evaluate for seizure.  Level of alertness: Awake  AEDs during EEG study: None  Technical aspects: This EEG study was done with scalp electrodes positioned according to the 10-20 International system of electrode placement. Electrical activity was acquired at a sampling rate of 500Hz  and reviewed with a high frequency filter of 70Hz  and a low frequency filter of 1Hz . EEG data were recorded continuously and digitally stored.  Description: The posterior dominant rhythm consists of 10 Hz activity of moderate voltage (25-35 uV) seen predominantly in posterior head regions, symmetric and reactive to eye opening and eye closing. Hyperventilation  and photic stimulation were not performed.    IMPRESSION: This study is within normal limits. No seizures or epileptiform discharges were seen throughout the recording. If concern for ictal-interictal activity persists, consider long-term EEG monitoring.    EEG adult  Result Date: 05/22/2021 , MD     05/22/2021 10:35 AM Patient Name: Luisfelipe Engelstad MRN: Charlsie Quest Epilepsy Attending: 05/24/2021 Referring Physician/Provider: Dr Jefferey Pica Date: 05/22/2021 Duration: 23.26 mins Patient history:  35 y.o. male with transient left weakness and slurred speech. EEG to evaluate for seizure. Level of alertness: Awake, asleep AEDs during EEG study: None Technical aspects: This EEG study was done with scalp electrodes positioned according to the 10-20 International system of electrode placement. Electrical activity was acquired at a sampling rate of 500Hz  and reviewed with a high frequency filter of 70Hz  and a low frequency filter of 1Hz . EEG data were recorded continuously and digitally stored. Description: The posterior dominant rhythm consists of 10 Hz activity of moderate voltage (25-35 uV) seen predominantly in posterior head regions, symmetric and reactive to eye opening and eye closing. Sleep was characterized by vertex waves,  maximal frontocentral region. Hyperventilation and photic stimulation were not performed.   IMPRESSION: This study is within normal limits. No seizures or epileptiform discharges were seen throughout the recording. Priyanka O Yadav   VAS Marisue Humble TRANSCRANIAL DOPPLER W BUBBLES  Result Date: 05/25/2021  Transcranial Doppler with Bubble Patient Name:  [facility]  Date of Exam:   05/25/2021 Medical Rec #:       Accession #:    Date of Birth: 1985-11-28  Patient Gender: M Patient Age:   33 years Exam Location:  [facility] Procedure:      VAS [place] TRANSCRANIAL DOPPLER W BUBBLES Referring Phys: Angelique Blonder WOLFE  --------------------------------------------------------------------------------  Indications: Stroke. Comparison Study: No prior studies. Performing Technologist: Chanda Busing RVT  Examination Guidelines: A complete evaluation includes B-mode imaging, spectral Doppler, color Doppler, and power Doppler as needed of all accessible portions of each vessel. Bilateral testing is considered an integral part of a complete examination. Limited examinations for reoccurring indications may be performed as noted.  Summary:  A vascular evaluation was performed. The right middle cerebral artery was studied. An IV was inserted into the patient's left Cephalic vein. Verbal informed consent was obtained.  Negative for PFO. NEGATIVE TCD Bubble study with no evidence of right to left shunt noted. *See table(s) above for TCD measurements and observations.  Diagnosing physician: Delia Heady MD Electronically signed by Delia Heady MD on 05/25/2021 at 3:48:47 PM.    Final    ECHOCARDIOGRAM COMPLETE  Result Date: 05/23/2021    ECHOCARDIOGRAM REPORT   Patient Name:   [facility] Date of Exam: 05/23/2021 Medical Rec #:  [id number]      Height:       63.0 in Accession #:    [id number]     Weight:       313.1 lb Date of Birth:  April 07, 1986      BSA:          2.339 m Patient Age:    35 years       BP:           124/91 mmHg Patient Gender: M              HR:           75 bpm. Exam Location:  Inpatient Procedure: 2D Echo Indications:    TIA  History:        Patient has no prior history of Echocardiogram examinations.                 Risk Factors:Sleep Apnea, Current Smoker and Dyslipidemia.  Sonographer:    Delcie Roch RDCS Referring Phys: [phone] RONDELL A SMITH IMPRESSIONS  1. Left ventricular ejection fraction, by estimation, is 60 to 65%. The left ventricle has normal function. The left ventricle has no regional wall motion abnormalities. There is mild left ventricular hypertrophy. Left ventricular diastolic parameters were  normal.  2. Right ventricular systolic function is normal. The right ventricular size is normal.  3. The mitral valve is normal in structure. No evidence of mitral valve regurgitation. No evidence of mitral stenosis.  4. The aortic valve has an indeterminant number of cusps. Aortic valve regurgitation is not visualized. No aortic stenosis is present.  5. The inferior vena cava is normal in size with greater than 50% respiratory variability, suggesting right atrial pressure of 3 mmHg. Comparison(s): No prior Echocardiogram. FINDINGS  Left Ventricle: Left ventricular ejection fraction, by estimation, is 60 to 65%. The left ventricle has normal function. The left ventricle has no regional wall motion abnormalities. The left ventricular internal cavity size was normal in size. There is  mild left ventricular hypertrophy. Left ventricular diastolic parameters were normal. Right Ventricle: The right ventricular size is normal. Right ventricular systolic function is normal. Left Atrium: Left atrial size was normal in size. Right Atrium: Right atrial size was normal in size. Pericardium: There is no evidence of pericardial effusion. Mitral Valve: The mitral valve is normal in structure.  No evidence of mitral valve regurgitation. No evidence of mitral valve stenosis. Tricuspid Valve: The tricuspid valve is normal in structure. Tricuspid valve regurgitation is trivial. No evidence of tricuspid stenosis. Aortic Valve: The aortic valve has an indeterminant number of cusps. Aortic valve regurgitation is not visualized. No aortic stenosis is present. Aortic valve mean gradient measures 4.0 mmHg. Aortic valve peak gradient measures 7.5 mmHg. Aortic valve area, by VTI measures 2.51 cm. Pulmonic Valve: The pulmonic valve was not well visualized. Pulmonic valve regurgitation is not visualized. No evidence of pulmonic stenosis. Aorta: The aortic root is normal in size and structure. Venous: The inferior vena cava is normal in size  with greater than 50% respiratory variability, suggesting right atrial pressure of 3 mmHg. IAS/Shunts: No atrial level shunt detected by color flow Doppler.  LEFT VENTRICLE PLAX 2D LVIDd:         4.10 cm   Diastology LVIDs:         3.00 cm   LV e' medial:    8.27 cm/s LV PW:         1.10 cm   LV E/e' medial:  10.6 LV IVS:        1.20 cm   LV e' lateral:   13.70 cm/s LVOT diam:     1.90 cm   LV E/e' lateral: 6.4 LV SV:         64 LV SV Index:   28 LVOT Area:     2.84 cm  RIGHT VENTRICLE             IVC RV Basal diam:  3.70 cm     IVC diam: 1.60 cm RV S prime:     13.80 cm/s TAPSE (M-mode): 1.5 cm LEFT ATRIUM             Index        RIGHT ATRIUM           Index LA diam:        3.40 cm 1.45 cm/m   RA Area:     22.50 cm LA Vol (A2C):   36.9 ml 15.77 ml/m  RA Volume:   75.90 ml  32.45 ml/m LA Vol (A4C):   44.0 ml 18.81 ml/m LA Biplane Vol: 42.4 ml 18.13 ml/m  AORTIC VALVE AV Area (Vmax):    2.65 cm AV Area (Vmean):   2.46 cm AV Area (VTI):     2.51 cm AV Vmax:           137.00 cm/s AV Vmean:          90.200 cm/s AV VTI:            0.256 m AV Peak Grad:      7.5 mmHg AV Mean Grad:      4.0 mmHg LVOT Vmax:         128.00 cm/s LVOT Vmean:        78.400 cm/s LVOT VTI:          0.227 m LVOT/AV VTI ratio: 0.89  AORTA Ao Root diam: 2.90 cm Ao Asc diam:  2.90 cm MITRAL VALVE MV Area (PHT): 3.50 cm    SHUNTS MV Decel Time: 217 msec    Systemic VTI:  0.23 m MV E velocity: 87.40 cm/s  Systemic Diam: 1.90 cm MV A velocity: 69.40 cm/s MV E/A ratio:  1.26 Olga Millers MD Electronically signed by Olga Millers MD Signature Date/Time: 05/23/2021/10:59:21 AM    Final    CT HEAD  CODE STROKE WO CONTRAST  Result Date: 05/22/2021 CLINICAL DATA:  Code stroke.  Left-sided weakness EXAM: CT HEAD WITHOUT CONTRAST TECHNIQUE: Contiguous axial images were obtained from the base of the skull through the vertex without intravenous contrast. COMPARISON:  None. FINDINGS: Brain: No evidence of acute infarction, hemorrhage, cerebral  edema, mass, mass effect, or midline shift. Ventricles and sulci are normal for age. No extra-axial fluid collection. Vascular: No hyperdense vessel or unexpected calcification. Skull: Normal. Negative for fracture or focal lesion. Sinuses/Orbits: No acute finding. Other: The mastoid air cells are well aerated. ASPECTS [facility] Stroke Program Early CT Score) - Ganglionic level infarction (caudate, lentiform nuclei, internal capsule, insula, M1-M3 cortex): 7 - Supraganglionic infarction (M4-M6 cortex): 3 Total score (0-10 with 10 being normal): 10 IMPRESSION: 1. No acute intracranial process. 2. ASPECTS is 10 Code stroke imaging results were communicated on 05/22/2021 at 2:50 am to provider [facility] via secure text paging. Electronically Signed   By: Wiliam Ke M.D.   On: 05/22/2021 02:51   VAS [place] LOWER EXTREMITY VENOUS (DVT)  Result Date: 05/25/2021  Lower Venous DVT Study Patient Name:  [facility]  Date of Exam:   05/25/2021 Medical Rec #: [id number]       Accession #:    [id number] Date of Birth: 1986/02/02       Patient Gender: M Patient Age:   74 years Exam Location:  [facility] Procedure:      VAS [place] LOWER EXTREMITY VENOUS (DVT) Referring Phys: Gevena Mart --------------------------------------------------------------------------------  Indications: Stroke.  Risk Factors: None identified. Limitations: Poor ultrasound/tissue interface and body habitus. Comparison Study: No prior studies. Performing Technologist: Chanda Busing RVT  Examination Guidelines: A complete evaluation includes B-mode imaging, spectral Doppler, color Doppler, and power Doppler as needed of all accessible portions of each vessel. Bilateral testing is considered an integral part of a complete examination. Limited examinations for reoccurring indications may be performed as noted. The reflux portion of the exam is performed with the patient in reverse Trendelenburg.   +---------+---------------+---------+-----------+----------+--------------+ RIGHT    CompressibilityPhasicitySpontaneityPropertiesThrombus Aging +---------+---------------+---------+-----------+----------+--------------+ CFV      Full           Yes      Yes                                 +---------+---------------+---------+-----------+----------+--------------+ SFJ      Full                                                        +---------+---------------+---------+-----------+----------+--------------+ FV Prox  Full                                                        +---------+---------------+---------+-----------+----------+--------------+ FV Mid   Full                                                        +---------+---------------+---------+-----------+----------+--------------+ FV DistalFull                                                        +---------+---------------+---------+-----------+----------+--------------+  PFV      Full                                                        +---------+---------------+---------+-----------+----------+--------------+ POP      Full           Yes      Yes                                 +---------+---------------+---------+-----------+----------+--------------+ PTV      Full                                                        +---------+---------------+---------+-----------+----------+--------------+ PERO     Full                                                        +---------+---------------+---------+-----------+----------+--------------+   +---------+---------------+---------+-----------+----------+--------------+ LEFT     CompressibilityPhasicitySpontaneityPropertiesThrombus Aging +---------+---------------+---------+-----------+----------+--------------+ CFV      Full           Yes      Yes                                  +---------+---------------+---------+-----------+----------+--------------+ SFJ      Full                                                        +---------+---------------+---------+-----------+----------+--------------+ FV Prox  Full                                                        +---------+---------------+---------+-----------+----------+--------------+ FV Mid   Full                                                        +---------+---------------+---------+-----------+----------+--------------+ FV DistalFull                                                        +---------+---------------+---------+-----------+----------+--------------+ PFV      Full                                                        +---------+---------------+---------+-----------+----------+--------------+   POP      Full           Yes      Yes                                 +---------+---------------+---------+-----------+----------+--------------+ PTV      Full                                                        +---------+---------------+---------+-----------+----------+--------------+ PERO     Full                                                        +---------+---------------+---------+-----------+----------+--------------+     Summary: RIGHT: - There is no evidence of deep vein thrombosis in the lower extremity. However, portions of this examination were limited- see technologist comments above.  - No cystic structure found in the popliteal fossa.  LEFT: - There is no evidence of deep vein thrombosis in the lower extremity. However, portions of this examination were limited- see technologist comments above.  - No cystic structure found in the popliteal fossa.  *See table(s) above for measurements and observations. Electronically signed by Waverly Ferrari MD on 05/25/2021 at 2:38:24 PM.    Final    CT ANGIO HEAD CODE STROKE  Addendum Date: 05/22/2021    ADDENDUM REPORT: 05/22/2021 09:19 ADDENDUM: Study discussed by telephone with Dr. Roda Shutters at 9:16 am on 05/22/2021. He questions abnormality of the distal Right MCA M1 segment on this exam, and on review there is moderate to severe irregularity and stenosis of the distal Right M1. See series 10, image 21 and series 9, image 78. That MCA bifurcation does remain patent, and Right MCA M2 branches appear within normal limits. No similar vessel irregularity or stenosis identified. No discrete atherosclerosis elsewhere in the head or neck. Unclear whether the abnormality of the Right MCA represents acute thromboembolic disease versus vaso spasm or other. The patient's NIH score is now approaching zero, and Dr. Roda Shutters and I discussed follow-up Brain MRI with intracranial MRA to re-evaluate. Electronically Signed   By: Odessa Fleming M.D.   On: 05/22/2021 09:19   Result Date: 05/22/2021 CLINICAL DATA:  Left arm weakness and transient shaking, possible left facial droop EXAM: CT ANGIOGRAPHY HEAD AND NECK TECHNIQUE: Multidetector CT imaging of the head and neck was performed using the standard protocol during bolus administration of intravenous contrast. Multiplanar CT image reconstructions and MIPs were obtained to evaluate the vascular anatomy. Carotid stenosis measurements (when applicable) are obtained utilizing NASCET criteria, using the distal internal carotid diameter as the denominator. CONTRAST:  50mL OMNIPAQUE IOHEXOL 350 MG/ML SOLN COMPARISON:  None. FINDINGS: CT HEAD FINDINGS Please see same-day CT stroke code CTA NECK FINDINGS Aortic arch: 4 vessel arch, with the origin of the left vertebral artery originating from the arch imaged portion shows no evidence of aneurysm or dissection. No significant stenosis of the major arch vessel origins. Right carotid system: No evidence of dissection, stenosis (50% or greater) or occlusion. Left carotid system: No evidence of dissection, stenosis (50% or greater) or occlusion.  Retropharyngeal  course of the left CCA and ICA. Vertebral arteries: No evidence of dissection, stenosis (50% or greater) or occlusion. Skeleton: No acute osseous abnormality. Other neck: Negative Upper chest: Negative Review of the MIP images confirms the above findings CTA HEAD FINDINGS Anterior circulation: Both internal carotid arteries are patent to the termini, without stenosis or other abnormality. A1 segments patent, with possible stenosis at the origin of the left A1. Normal anterior communicating artery. Anterior cerebral arteries are patent to their distal aspects. No M1 stenosis or occlusion. Normal MCA bifurcations. Distal MCA branches perfused and symmetric. Posterior circulation: Vertebral arteries widely patent to the vertebrobasilar junction without stenosis, with the majority of the left vertebral artery supplying the left PICA. Posterior inferior cerebral arteries patent bilaterally. Basilar patent to its distal aspect. Superior cerebral arteries patent bilaterally. Fetal origin left PCA. PCAs well perfused to their distal aspects without stenosis. The right posterior communicating artery is not definitively visualized. Venous sinuses: As permitted by contrast timing, patent. Anatomic variants: None significant Review of the MIP images confirms the above findings IMPRESSION: 1. No hemodynamically significant stenosis in the neck. 2. No intracranial large vessel occlusion. Electronically Signed: By: Wiliam Ke M.D. On: 05/22/2021 03:16   CT ANGIO NECK CODE STROKE  Addendum Date: 05/22/2021   ADDENDUM REPORT: 05/22/2021 09:19 ADDENDUM: Study discussed by telephone with Dr. Roda Shutters at 9:16 am on 05/22/2021. He questions abnormality of the distal Right MCA M1 segment on this exam, and on review there is moderate to severe irregularity and stenosis of the distal Right M1. See series 10, image 21 and series 9, image 78. That MCA bifurcation does remain patent, and Right MCA M2 branches appear within  normal limits. No similar vessel irregularity or stenosis identified. No discrete atherosclerosis elsewhere in the head or neck. Unclear whether the abnormality of the Right MCA represents acute thromboembolic disease versus vaso spasm or other. The patient's NIH score is now approaching zero, and Dr. Roda Shutters and I discussed follow-up Brain MRI with intracranial MRA to re-evaluate. Electronically Signed   By: Odessa Fleming M.D.   On: 05/22/2021 09:19   Result Date: 05/22/2021 CLINICAL DATA:  Left arm weakness and transient shaking, possible left facial droop EXAM: CT ANGIOGRAPHY HEAD AND NECK TECHNIQUE: Multidetector CT imaging of the head and neck was performed using the standard protocol during bolus administration of intravenous contrast. Multiplanar CT image reconstructions and MIPs were obtained to evaluate the vascular anatomy. Carotid stenosis measurements (when applicable) are obtained utilizing NASCET criteria, using the distal internal carotid diameter as the denominator. CONTRAST:  50mL OMNIPAQUE IOHEXOL 350 MG/ML SOLN COMPARISON:  None. FINDINGS: CT HEAD FINDINGS Please see same-day CT stroke code CTA NECK FINDINGS Aortic arch: 4 vessel arch, with the origin of the left vertebral artery originating from the arch imaged portion shows no evidence of aneurysm or dissection. No significant stenosis of the major arch vessel origins. Right carotid system: No evidence of dissection, stenosis (50% or greater) or occlusion. Left carotid system: No evidence of dissection, stenosis (50% or greater) or occlusion. Retropharyngeal course of the left CCA and ICA. Vertebral arteries: No evidence of dissection, stenosis (50% or greater) or occlusion. Skeleton: No acute osseous abnormality. Other neck: Negative Upper chest: Negative Review of the MIP images confirms the above findings CTA HEAD FINDINGS Anterior circulation: Both internal carotid arteries are patent to the termini, without stenosis or other abnormality. A1  segments patent, with possible stenosis at the origin of the left A1. Normal anterior communicating artery.  Anterior cerebral arteries are patent to their distal aspects. No M1 stenosis or occlusion. Normal MCA bifurcations. Distal MCA branches perfused and symmetric. Posterior circulation: Vertebral arteries widely patent to the vertebrobasilar junction without stenosis, with the majority of the left vertebral artery supplying the left PICA. Posterior inferior cerebral arteries patent bilaterally. Basilar patent to its distal aspect. Superior cerebral arteries patent bilaterally. Fetal origin left PCA. PCAs well perfused to their distal aspects without stenosis. The right posterior communicating artery is not definitively visualized. Venous sinuses: As permitted by contrast timing, patent. Anatomic variants: None significant Review of the MIP images confirms the above findings IMPRESSION: 1. No hemodynamically significant stenosis in the neck. 2. No intracranial large vessel occlusion. Electronically Signed: By: Wiliam Ke M.D. On: 05/22/2021 03:16   US Abdomen Limited RUQ (LIVER/GB)  Result Date: 05/27/2021 CLINICAL DATA:  Elevated LFTs EXAM: ULTRASOUND ABDOMEN LIMITED RIGHT UPPER QUADRANT COMPARISON:  None. FINDINGS: Gallbladder: No gallstones or wall thickening visualized. No sonographic Murphy sign noted by sonographer. Common bile duct: Diameter: 4 mm Liver: No focal lesion identified. Increased parenchymal echogenicity. Portal vein is patent on color Doppler imaging with normal direction of blood flow towards the liver. Other: None. IMPRESSION: 1.  Hepatic steatosis. 2.  No acute ultrasound findings of the right upper quadrant. Electronically Signed   By: Jearld Lesch M.D.   On: 05/27/2021 17:55     TODAY-DAY OF DISCHARGE:  Subjective:   Ernest [name] today has no headache,no chest abdominal pain,no new weakness tingling or numbness, feels much better wants to go home today.   Objective:    Blood pressure 140/86, pulse 67, temperature (!) 97.2 F (36.2 C), resp. rate 10, height 5\' 3"  (1.6 m), weight 107.9 kg, SpO2 96 %.  Intake/Output Summary (Last 24 hours) at 05/28/2021 1557 Last data filed at 05/28/2021 1254 Gross per 24 hour  Intake 250 ml  Output --  Net 250 ml   Filed Weights   05/25/21 0230 05/25/21 1714  Weight: 110.2 kg 107.9 kg    Exam: Awake Alert, Oriented *3, No new F.N deficits, Normal affect [place].AT,PERRAL Supple Neck,No JVD, No cervical lymphadenopathy appriciated.  Symmetrical Chest wall movement, Good air movement bilaterally, CTAB RRR,No Gallops,Rubs or new Murmurs, No Parasternal Heave +ve B.Sounds, Abd Soft, Non tender, No organomegaly appriciated, No rebound -guarding or rigidity. No Cyanosis, Clubbing or edema, No new Rash or bruise   PERTINENT RADIOLOGIC STUDIES: US Abdomen Limited RUQ (LIVER/GB)  Result Date: 05/27/2021 CLINICAL DATA:  Elevated LFTs EXAM: ULTRASOUND ABDOMEN LIMITED RIGHT UPPER QUADRANT COMPARISON:  None. FINDINGS: Gallbladder: No gallstones or wall thickening visualized. No sonographic Murphy sign noted by sonographer. Common bile duct: Diameter: 4 mm Liver: No focal lesion identified. Increased parenchymal echogenicity. Portal vein is patent on color Doppler imaging with normal direction of blood flow towards the liver. Other: None. IMPRESSION: 1.  Hepatic steatosis. 2.  No acute ultrasound findings of the right upper quadrant. Electronically Signed   By: Jearld Lesch M.D.   On: 05/27/2021 17:55     PERTINENT LAB RESULTS: CBC: Recent Labs    05/27/21 0355 05/28/21 0158  WBC 12.5* 11.0*  HGB 13.7 13.6  HCT 41.1 40.8  PLT 284 283   CMET CMP     Component Value Date/Time   NA 135 05/28/2021 0158   K 3.5 05/28/2021 0158   CL 103 05/28/2021 0158   CO2 24 05/28/2021 0158   GLUCOSE 91 05/28/2021 0158   BUN 8 05/28/2021 0158   CREATININE  0.96 05/28/2021 0158   CALCIUM 8.7 (L) 05/28/2021 0158   PROT 6.9  05/28/2021 0158   ALBUMIN 3.1 (L) 05/28/2021 0158   AST 66 (H) 05/28/2021 0158   ALT 95 (H) 05/28/2021 0158   ALKPHOS 81 05/28/2021 0158   BILITOT 0.7 05/28/2021 0158   GFRNONAA >60 05/28/2021 0158   GFRAA >60 08/07/2017 1426    GFR Estimated Creatinine Clearance: 118.5 mL/min (by C-G formula based on SCr of 0.96 mg/dL). No results for input(s): LIPASE, AMYLASE in the last 72 hours. No results for input(s): CKTOTAL, CKMB, CKMBINDEX, TROPONINI in the last 72 hours. Invalid input(s): POCBNP No results for input(s): DDIMER in the last 72 hours. No results for input(s): HGBA1C in the last 72 hours. No results for input(s): CHOL, HDL, LDLCALC, TRIG, CHOLHDL, LDLDIRECT in the last 72 hours. No results for input(s): TSH, T4TOTAL, T3FREE, THYROIDAB in the last 72 hours.  Invalid input(s): FREET3 No results for input(s): VITAMINB12, FOLATE, FERRITIN, TIBC, IRON, RETICCTPCT in the last 72 hours. Coags: No results for input(s): INR in the last 72 hours.  Invalid input(s): PT Microbiology: Recent Results (from the past 240 hour(s))  Resp Panel by RT-PCR (Flu A&B, Covid) Nasopharyngeal Swab     Status: None   Collection Time: 05/22/21  2:38 AM   Specimen: Nasopharyngeal Swab; Nasopharyngeal(NP) swabs in vial transport medium  Result Value Ref Range Status   SARS Coronavirus 2 by RT PCR NEGATIVE NEGATIVE Final    Comment: (NOTE) SARS-CoV-2 target nucleic acids are NOT DETECTED.  The SARS-CoV-2 RNA is generally detectable in upper respiratory specimens during the acute phase of infection. The lowest concentration of SARS-CoV-2 viral copies this assay can detect is 138 copies/mL. A negative result does not preclude SARS-Cov-2 infection and should not be used as the sole basis for treatment or other patient management decisions. A negative result may occur with  improper specimen collection/handling, submission of specimen other than nasopharyngeal swab, presence of viral mutation(s) within  the areas targeted by this assay, and inadequate number of viral copies(<138 copies/mL). A negative result must be combined with clinical observations, patient history, and epidemiological information. The expected result is Negative.  Fact Sheet for Patients:  BloggerCourse.com  Fact Sheet for Healthcare Providers:  SeriousBroker.it  This test is no t yet approved or cleared by the [place] FDA and  has been authorized for detection and/or diagnosis of SARS-CoV-2 by FDA under an Emergency Use Authorization (EUA). This EUA will remain  in effect (meaning this test can be used) for the duration of the COVID-19 declaration under Section 564(b)(1) of the Act, 21 U.S.C.section 360bbb-3(b)(1), unless the authorization is terminated  or revoked sooner.       Influenza A by PCR NEGATIVE NEGATIVE Final   Influenza B by PCR NEGATIVE NEGATIVE Final    Comment: (NOTE) The Xpert Xpress SARS-CoV-2/FLU/RSV plus assay is intended as an aid in the diagnosis of influenza from Nasopharyngeal swab specimens and should not be used as a sole basis for treatment. Nasal washings and aspirates are unacceptable for Xpert Xpress SARS-CoV-2/FLU/RSV testing.  Fact Sheet for Patients: BloggerCourse.com  Fact Sheet for Healthcare Providers: SeriousBroker.it  This test is not yet approved or cleared by the [place] FDA and has been authorized for detection and/or diagnosis of SARS-CoV-2 by FDA under an Emergency Use Authorization (EUA). This EUA will remain in effect (meaning this test can be used) for the duration of the COVID-19 declaration under Section 564(b)(1) of the Act, 21 U.S.C. section  360bbb-3(b)(1), unless the authorization is terminated or revoked.  Performed at [facility], [address] [place], [place] [address]   Resp Panel by RT-PCR (Flu A&B, Covid) Nasopharyngeal  Swab     Status: None   Collection Time: 05/25/21  4:58 AM   Specimen: Nasopharyngeal Swab; Nasopharyngeal(NP) swabs in vial transport medium  Result Value Ref Range Status   SARS Coronavirus 2 by RT PCR NEGATIVE NEGATIVE Final    Comment: (NOTE) SARS-CoV-2 target nucleic acids are NOT DETECTED.  The SARS-CoV-2 RNA is generally detectable in upper respiratory specimens during the acute phase of infection. The lowest concentration of SARS-CoV-2 viral copies this assay can detect is 138 copies/mL. A negative result does not preclude SARS-Cov-2 infection and should not be used as the sole basis for treatment or other patient management decisions. A negative result may occur with  improper specimen collection/handling, submission of specimen other than nasopharyngeal swab, presence of viral mutation(s) within the areas targeted by this assay, and inadequate number of viral copies(<138 copies/mL). A negative result must be combined with clinical observations, patient history, and epidemiological information. The expected result is Negative.  Fact Sheet for Patients:  BloggerCourse.com  Fact Sheet for Healthcare Providers:  SeriousBroker.it  This test is no t yet approved or cleared by the [place] FDA and  has been authorized for detection and/or diagnosis of SARS-CoV-2 by FDA under an Emergency Use Authorization (EUA). This EUA will remain  in effect (meaning this test can be used) for the duration of the COVID-19 declaration under Section 564(b)(1) of the Act, 21 U.S.C.section 360bbb-3(b)(1), unless the authorization is terminated  or revoked sooner.       Influenza A by PCR NEGATIVE NEGATIVE Final   Influenza B by PCR NEGATIVE NEGATIVE Final    Comment: (NOTE) The Xpert Xpress SARS-CoV-2/FLU/RSV plus assay is intended as an aid in the diagnosis of influenza from Nasopharyngeal swab specimens and should not be used as a sole  basis for treatment. Nasal washings and aspirates are unacceptable for Xpert Xpress SARS-CoV-2/FLU/RSV testing.  Fact Sheet for Patients: BloggerCourse.com  Fact Sheet for Healthcare Providers: SeriousBroker.it  This test is not yet approved or cleared by the [place] FDA and has been authorized for detection and/or diagnosis of SARS-CoV-2 by FDA under an Emergency Use Authorization (EUA). This EUA will remain in effect (meaning this test can be used) for the duration of the COVID-19 declaration under Section 564(b)(1) of the Act, 21 U.S.C. section 360bbb-3(b)(1), unless the authorization is terminated or revoked.  Performed at [facility], [address] [place], [place] [address]   C Difficile Quick Screen w PCR reflex     Status: None   Collection Time: 05/26/21  1:28 AM   Specimen: STOOL  Result Value Ref Range Status   C Diff antigen NEGATIVE NEGATIVE Final   C Diff toxin NEGATIVE NEGATIVE Final   C Diff interpretation No C. difficile detected.  Final    Comment: Performed at [facility], [address], [place], [place] [address]    FURTHER DISCHARGE INSTRUCTIONS:  Get Medicines reviewed and adjusted: Please take all your medications with you for your next visit with your Primary MD  Laboratory/radiological data: Please request your Primary MD to go over all hospital tests and procedure/radiological results at the follow up, please ask your Primary MD to get all Hospital records sent to his/her office.  In some cases, they will be blood work, cultures and biopsy  results pending at the time of your discharge. Please request that your primary care M.D. goes through all the records of your hospital data and follows up on these results.  Also Note the following: If you experience worsening of your admission symptoms, develop shortness of breath, life threatening emergency, suicidal or homicidal thoughts  you must seek medical attention immediately by calling 911 or calling your MD immediately  if symptoms less severe.  You must read complete instructions/literature along with all the possible adverse reactions/side effects for all the Medicines you take and that have been prescribed to you. Take any new Medicines after you have completely understood and accpet all the possible adverse reactions/side effects.   Do not drive when taking Pain medications or sleeping medications (Benzodaizepines)  Do not take more than prescribed Pain, Sleep and Anxiety Medications. It is not advisable to combine anxiety,sleep and pain medications without talking with your primary care practitioner  Special Instructions: If you have smoked or chewed Tobacco  in the last 2 yrs please stop smoking, stop any regular Alcohol  and or any Recreational drug use.  Wear Seat belts while driving.  Please note: You were cared for by a hospitalist during your hospital stay. Once you are discharged, your primary care physician will handle any further medical issues. Please note that NO REFILLS for any discharge medications will be authorized once you are discharged, as it is imperative that you return to your primary care physician (or establish a relationship with a primary care physician if you do not have one) for your post hospital discharge needs so that they can reassess your need for medications and monitor your lab values.  Total Time spent coordinating discharge including counseling, education and face to face time equals 35 minutes.  SignedJeoffrey Massed 05/28/2021 3:57 PM

## 2021-05-28 NOTE — Anesthesia Preprocedure Evaluation (Signed)
Anesthesia Evaluation  Patient identified by MRN, date of birth, ID band Patient awake    Reviewed: Allergy & Precautions, NPO status , Patient's Chart, lab work & pertinent test results  History of Anesthesia Complications Negative for: history of anesthetic complications  Airway Mallampati: II  TM Distance: >3 FB Neck ROM: Full    Dental no notable dental hx.    Pulmonary sleep apnea , Current Smoker,    Pulmonary exam normal        Cardiovascular negative cardio ROS Normal cardiovascular exam     Neuro/Psych CVA negative psych ROS   GI/Hepatic negative GI ROS, Neg liver ROS,   Endo/Other  Morbid obesity  Renal/GU negative Renal ROS  negative genitourinary   Musculoskeletal negative musculoskeletal ROS (+)   Abdominal   Peds  Hematology negative hematology ROS (+)   Anesthesia Other Findings Day of surgery medications reviewed with patient.  Reproductive/Obstetrics negative OB ROS                             Anesthesia Physical Anesthesia Plan  ASA: 3  Anesthesia Plan: MAC   Post-op Pain Management:    Induction:   PONV Risk Score and Plan: Treatment may vary due to age or medical condition and Propofol infusion  Airway Management Planned: Natural Airway and Nasal Cannula  Additional Equipment:   Intra-op Plan:   Post-operative Plan:   Informed Consent: I have reviewed the patients History and Physical, chart, labs and discussed the procedure including the risks, benefits and alternatives for the proposed anesthesia with the patient or authorized representative who has indicated his/her understanding and acceptance.     Interpreter used for [employer] Discussed with: CRNA  Anesthesia Plan Comments: (Nepali ipad interpreter used for consent. Daiva Huge, MD)        Anesthesia Quick Evaluation

## 2021-05-29 ENCOUNTER — Encounter (HOSPITAL_COMMUNITY): Payer: Self-pay | Admitting: Cardiovascular Disease

## 2021-05-30 ENCOUNTER — Telehealth (HOSPITAL_COMMUNITY): Payer: Self-pay | Admitting: Pharmacist

## 2021-05-30 ENCOUNTER — Other Ambulatory Visit (HOSPITAL_COMMUNITY): Payer: Self-pay

## 2021-05-30 ENCOUNTER — Telehealth: Payer: Self-pay

## 2021-05-30 NOTE — Telephone Encounter (Signed)
Transition Care Management Unsuccessful Follow-up Telephone Call  Date of discharge and from where:  05/28/21 [place] [facility]   Attempts:  3rd Attempt  Reason for unsuccessful TCM follow-up call:  No answer/busy

## 2021-06-04 ENCOUNTER — Telehealth: Payer: Self-pay | Admitting: *Deleted

## 2021-06-04 NOTE — Telephone Encounter (Signed)
Pt spouse called regarding delivery of CPAP machine.  RNCM reached out to [place] who set up DME to investigate.

## 2021-06-16 ENCOUNTER — Other Ambulatory Visit: Payer: Self-pay

## 2021-06-16 ENCOUNTER — Encounter: Payer: Self-pay | Admitting: Family

## 2021-06-16 ENCOUNTER — Ambulatory Visit (INDEPENDENT_AMBULATORY_CARE_PROVIDER_SITE_OTHER): Payer: PRIVATE HEALTH INSURANCE | Admitting: Family

## 2021-06-16 VITALS — BP 130/80 | HR 72 | Temp 98.5°F | Ht 63.0 in | Wt 236.0 lb

## 2021-06-16 DIAGNOSIS — G4733 Obstructive sleep apnea (adult) (pediatric): Secondary | ICD-10-CM | POA: Diagnosis not present

## 2021-06-16 DIAGNOSIS — R531 Weakness: Secondary | ICD-10-CM

## 2021-06-16 DIAGNOSIS — Z8673 Personal history of transient ischemic attack (TIA), and cerebral infarction without residual deficits: Secondary | ICD-10-CM | POA: Diagnosis not present

## 2021-06-16 DIAGNOSIS — R29898 Other symptoms and signs involving the musculoskeletal system: Secondary | ICD-10-CM

## 2021-06-16 DIAGNOSIS — E785 Hyperlipidemia, unspecified: Secondary | ICD-10-CM

## 2021-06-16 LAB — COMPREHENSIVE METABOLIC PANEL
ALT: 54 U/L — ABNORMAL HIGH (ref 0–53)
AST: 40 U/L — ABNORMAL HIGH (ref 0–37)
Albumin: 4.3 g/dL (ref 3.5–5.2)
Alkaline Phosphatase: 109 U/L (ref 39–117)
BUN: 8 mg/dL (ref 6–23)
CO2: 27 mEq/L (ref 19–32)
Calcium: 9.3 mg/dL (ref 8.4–10.5)
Chloride: 104 mEq/L (ref 96–112)
Creatinine, Ser: 0.97 mg/dL (ref 0.40–1.50)
GFR: 101.93 mL/min (ref >60.00)
Glucose, Bld: 76 mg/dL (ref 70–99)
Potassium: 4.1 mEq/L (ref 3.5–5.1)
Sodium: 139 mEq/L (ref 135–145)
Total Bilirubin: 1 mg/dL (ref 0.2–1.2)
Total Protein: 7.9 g/dL (ref 6.0–8.3)

## 2021-06-16 LAB — CBC WITH DIFFERENTIAL/PLATELET
Basophils Absolute: 0.1 10*3/uL (ref 0.0–0.1)
Basophils Relative: 1.1 % (ref 0.0–3.0)
Eosinophils Absolute: 0.6 10*3/uL (ref 0.0–0.7)
Eosinophils Relative: 5.7 % — ABNORMAL HIGH (ref 0.0–5.0)
HCT: 43.2 % (ref 39.0–52.0)
Hemoglobin: 14.4 g/dL (ref 13.0–17.0)
Lymphocytes Relative: 27.6 % (ref 12.0–46.0)
Lymphs Abs: 2.7 10*3/uL (ref 0.7–4.0)
MCHC: 33.3 g/dL (ref 30.0–36.0)
MCV: 85.3 fl (ref 78.0–100.0)
Monocytes Absolute: 0.9 10*3/uL (ref 0.1–1.0)
Monocytes Relative: 9.1 % (ref 3.0–12.0)
Neutro Abs: 5.5 10*3/uL (ref 1.4–7.7)
Neutrophils Relative %: 56.5 % (ref 43.0–77.0)
Platelets: 278 10*3/uL (ref 150.0–400.0)
RBC: 5.07 Mil/uL (ref 4.22–5.81)
RDW: 13.5 % (ref 11.5–15.5)
WBC: 9.8 10*3/uL (ref 4.0–10.5)

## 2021-06-16 LAB — LIPID PANEL
Cholesterol: 116 mg/dL (ref 0–200)
HDL: 35.8 mg/dL — ABNORMAL LOW (ref >39.00)
LDL Cholesterol: 61 mg/dL (ref 0–99)
NonHDL: 79.99
Total CHOL/HDL Ratio: 3
Triglycerides: 96 mg/dL (ref 0.0–149.0)
VLDL: 19.2 mg/dL (ref 0.0–40.0)

## 2021-06-16 MED ORDER — ASPIRIN 325 MG PO TBEC: 325.0000 mg | DELAYED_RELEASE_TABLET | Freq: Every day | ORAL | 1 refills | Status: DC

## 2021-06-16 MED ORDER — CLOPIDOGREL BISULFATE 75 MG PO TABS: 75.0000 mg | ORAL_TABLET | Freq: Every day | ORAL | 1 refills | Status: DC

## 2021-06-16 MED ORDER — ATORVASTATIN CALCIUM 80 MG PO TABS: 80.0000 mg | ORAL_TABLET | Freq: Every day | ORAL | 3 refills | Status: DC

## 2021-06-16 NOTE — Progress Notes (Signed)
New Patient Office Visit  Subjective:  Patient ID: Ernest [name], male    DOB: [birth year]  Age: 35 y.o. MRN: [id number]  CC:  Chief Complaint  Patient presents with   Annual Exam   Cerebrovascular Accident    05/2021; Since Stroke pt has not been able to use his left hand due to stiffness.    Dizziness    All symptoms started 1 month ago after Stroke.    Blurred Vision    HPI Ernest [name] presents to establish care after a recent hospitalization and to discuss a few acute problems. Sleep Apnea: He presents for a sleep evaluation. He complains of snoring, decreased concentration, excessive daytime sleepiness.  Symptoms began a few months ago, unchanged since that time. He denies kicking, decreased sexual drive. Previous evaluation and treatment has included PSG with C PAP titration while in the hospital, but pt reports he has not received his machine for home yet. LEFT sided weakness: pt had a CVA 1 month ago, still having residual weakness on his left side. Reports he has not had any physical therapy to date. He has a follow up appt with [facility] on 07/14/2021. He also has some slurred speech that is resolving, no other new symptoms. He reports he is taking his daily ASA, Lipitor, and Plavix. He has been unable to return to work.   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[place])     Past Surgical History:  Procedure Laterality Date   BUBBLE STUDY  05/28/2021   Procedure: BUBBLE STUDY;  Surgeon: Skeet Latch, MD;  Location: [place];  Service: Cardiovascular;;   DECOMPRESSION FASCIOTOMY FOREARM Right    TEE WITHOUT CARDIOVERSION N/A 05/28/2021   Procedure: TRANSESOPHAGEAL ECHOCARDIOGRAM (TEE);  Surgeon: Skeet Latch, MD;  Location[facility] ENDOSCOPY;  Service: Cardiovascular;  Laterality: N/A;    Family History  Problem Relation Age of Onset   Diabetes Neg Hx    Cancer Neg Hx    Heart failure Neg Hx    Hyperlipidemia Neg Hx     Hypertension Neg Hx     Social History   Socioeconomic History   Marital status: Significant Other    Spouse name: Not on file   Number of children: Not on file   Years of education: Not on file   Highest education level: Not on file  Occupational History   Not on file  Tobacco Use   Smoking status: Every Day    Packs/day: 0.01    Types: Cigarettes   Smokeless tobacco: Never  Substance and Sexual Activity   Alcohol use: Never   Drug use: Never   Sexual activity: Not on file  Other Topics Concern   Not on file  Social History Narrative   Not on file   Social Determinants of Health   Financial Resource Strain: Not on file  Food Insecurity: Not on file  Transportation Needs: Not on file  Physical Activity: Not on file  Stress: Not on file  Social Connections: Not on file  Intimate Partner Violence: Not on file    Objective:   Today's Vitals: BP 130/80   Pulse 72   Temp 98.5 F (36.9 C) (Temporal)   Ht 5' 3" (1.6 m)   Wt 236 lb (107 kg)   SpO2 99%   BMI 41.81 kg/m   Physical Exam Vitals and nursing note reviewed.  Constitutional:      General: He is not in acute distress.  Appearance: Normal appearance. He is obese.  HENT:     Head: Normocephalic.  Cardiovascular:     Rate and Rhythm: Normal rate and regular rhythm.  Pulmonary:     Effort: Pulmonary effort is normal.     Breath sounds: Normal breath sounds.  Musculoskeletal:        General: Normal range of motion.     Cervical back: Normal range of motion.  Skin:    General: Skin is warm and dry.  Neurological:     Mental Status: He is alert and oriented to person, place, and time.     Cranial Nerves: No facial asymmetry.     Sensory: Sensation is intact.     Motor: Weakness (left arm, leg) present.     Gait: Gait normal.     Comments: Unable to close left hand  Psychiatric:        Mood and Affect: Mood normal.    Assessment & Plan:   Problem List Items Addressed This Visit        Respiratory   OSA (obstructive sleep apnea)    Reviewed old records thru hospital -Non-compliant - dx a year ago by [facility], but never picked up CPAP- hospital reordered w/[facility], pt waiting to receive, called DME and they are trying to reach pt and GF to set up delivery.        Nervous and Auditory   Left-sided weakness    s/p CVA, pt reports left arm was weak before CVA due to injury at work. Also having left leg weakness, denies paresthesias. Not doing physical therapy, he was not told this would be ordered.        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 Has f/u [facility] appt on 12/12.      Relevant Medications   aspirin 325 MG EC tablet   atorvastatin (LIPITOR) 80 MG tablet   clopidogrel (PLAVIX) 75 MG tablet   Other Relevant Orders   Lipid panel (Completed)   Comp Met (CMET) (Completed)   CBC with Differential/Platelet (Completed)   Time spent with patient today was 40 minutes which consisted of chart review, discussing diagnoses, work up, treatment, answering questions, and documentation.   Outpatient Encounter Medications as of 06/16/2021  Medication Sig   [DISCONTINUED] aspirin 325 MG EC tablet Take 1 tablet (325 mg total) by mouth daily.   [DISCONTINUED] atorvastatin (LIPITOR) 80 MG tablet Take 1 tablet (80 mg total) by mouth daily.   [DISCONTINUED] clopidogrel (PLAVIX) 75 MG tablet Take 1 tablet (75 mg total) by mouth daily.   aspirin 325 MG EC tablet Take 1 tablet (325 mg total) by mouth daily.   atorvastatin (LIPITOR) 80 MG tablet Take 1 tablet (80 mg total) by mouth daily.   clopidogrel (PLAVIX) 75 MG tablet Take 1 tablet (75 mg total) by mouth daily.   No facility-administered  encounter medications on file as of 06/16/2021.    Follow-up: Return in about 4 weeks (around 07/14/2021) for post CVA.   Jeanie Sewer, NP

## 2021-06-16 NOTE — Patient Instructions (Addendum)
Welcome to [facility]! It was a pleasure meeting you today.  As discussed, Your medication refills have been sent to your pharmacy. Remember to follow up with the neurologist in regards to your recent stroke and left sided weakness. I will call regarding your CPAP machine, if you receive this, please call our office. Please schedule a 1 month follow up visit today.  PLEASE NOTE:  If you had any LAB tests please let us know if you have not heard back within a few days. You may see your results on MyChart before we have a chance to review them but we will give you a call once they are reviewed by [place]. If we ordered any REFERRALS today, please let us know if you have not heard from their office within the next week.  Let us know through MyChart if you are needing REFILLS, or have your pharmacy send [place] the request. You can also use MyChart to communicate with me or any office staff.  Please try these tips to maintain a healthy lifestyle:  Eat most of your calories during the day when you are active. Eliminate processed foods including packaged sweets (pies, cakes, cookies), reduce intake of potatoes, white bread, white pasta, and white rice. Look for whole grain options, oat flour or almond flour.  Each meal should contain half fruits/vegetables, one quarter protein, and one quarter carbs (no bigger than a computer mouse).  Cut down on sweet beverages. This includes juice, soda, and sweet tea. Also watch fruit intake, though this is a healthier sweet option, it still contains natural sugar! Limit to 3 servings daily.  Drink at least 1 glass of water with each meal and aim for at least 8 glasses per day  Exercise at least 150 minutes every week.

## 2021-06-17 DIAGNOSIS — R531 Weakness: Secondary | ICD-10-CM | POA: Insufficient documentation

## 2021-06-17 HISTORY — DX: Weakness: R53.1

## 2021-06-17 NOTE — Assessment & Plan Note (Addendum)
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 Has f/u [facility] appt on 12/12.

## 2021-06-17 NOTE — Assessment & Plan Note (Addendum)
Reviewed old records thru hospital -Non-compliant - dx a year ago by [facility], but never picked up CPAP- hospital reordered w/[facility], pt waiting to receive, called DME and they are trying to reach pt and GF to set up delivery.

## 2021-06-17 NOTE — Assessment & Plan Note (Signed)
Taking Lipitor daily, will recheck labs in 2 mos

## 2021-06-17 NOTE — Assessment & Plan Note (Signed)
s/p CVA, pt reports left arm was weak before CVA due to injury at work. Also having left leg weakness, denies paresthesias. Not doing physical therapy, he was not told this would be ordered.

## 2021-07-03 ENCOUNTER — Encounter (HOSPITAL_COMMUNITY): Payer: Self-pay | Admitting: Emergency Medicine

## 2021-07-03 ENCOUNTER — Telehealth: Payer: Self-pay

## 2021-07-03 ENCOUNTER — Emergency Department (HOSPITAL_COMMUNITY)
Admission: EM | Admit: 2021-07-03 | Discharge: 2021-07-04 | Disposition: A | Discharge status: Home / Self Care | Payer: 59 | Attending: Emergency Medicine | Admitting: Emergency Medicine

## 2021-07-03 ENCOUNTER — Other Ambulatory Visit: Payer: Self-pay

## 2021-07-03 DIAGNOSIS — R4781 Slurred speech: Secondary | ICD-10-CM | POA: Insufficient documentation

## 2021-07-03 DIAGNOSIS — F17210 Nicotine dependence, cigarettes, uncomplicated: Secondary | ICD-10-CM | POA: Insufficient documentation

## 2021-07-03 DIAGNOSIS — H538 Other visual disturbances: Secondary | ICD-10-CM | POA: Diagnosis not present

## 2021-07-03 DIAGNOSIS — Z7982 Long term (current) use of aspirin: Secondary | ICD-10-CM | POA: Diagnosis not present

## 2021-07-03 DIAGNOSIS — R531 Weakness: Secondary | ICD-10-CM | POA: Diagnosis not present

## 2021-07-03 DIAGNOSIS — Z79899 Other long term (current) drug therapy: Secondary | ICD-10-CM | POA: Insufficient documentation

## 2021-07-03 DIAGNOSIS — G4733 Obstructive sleep apnea (adult) (pediatric): Secondary | ICD-10-CM

## 2021-07-03 HISTORY — DX: Cerebral infarction, unspecified: I63.9

## 2021-07-03 LAB — CBC WITH DIFFERENTIAL/PLATELET
Abs Immature Granulocytes: 0.03 10*3/uL (ref 0.00–0.07)
Basophils Absolute: 0.1 10*3/uL (ref 0.0–0.1)
Basophils Relative: 1 %
Eosinophils Absolute: 0.5 10*3/uL (ref 0.0–0.5)
Eosinophils Relative: 5 %
HCT: 43.7 % (ref 39.0–52.0)
Hemoglobin: 14.2 g/dL (ref 13.0–17.0)
Immature Granulocytes: 0 %
Lymphocytes Relative: 24 %
Lymphs Abs: 2.3 10*3/uL (ref 0.7–4.0)
MCH: 28 pg (ref 26.0–34.0)
MCHC: 32.5 g/dL (ref 30.0–36.0)
MCV: 86.2 fL (ref 80.0–100.0)
Monocytes Absolute: 1 10*3/uL (ref 0.1–1.0)
Monocytes Relative: 11 %
Neutro Abs: 5.6 10*3/uL (ref 1.7–7.7)
Neutrophils Relative %: 59 %
Platelets: 289 10*3/uL (ref 150–400)
RBC: 5.07 MIL/uL (ref 4.22–5.81)
RDW: 13.3 % (ref 11.5–15.5)
WBC: 9.5 10*3/uL (ref 4.0–10.5)
nRBC: 0 % (ref 0.0–0.2)

## 2021-07-03 LAB — COMPREHENSIVE METABOLIC PANEL
ALT: 54 U/L — ABNORMAL HIGH (ref 0–44)
AST: 49 U/L — ABNORMAL HIGH (ref 15–41)
Albumin: 3.7 g/dL (ref 3.5–5.0)
Alkaline Phosphatase: 118 U/L (ref 38–126)
Anion gap: 6 (ref 5–15)
BUN: 9 mg/dL (ref 6–20)
CO2: 26 mmol/L (ref 22–32)
Calcium: 9.1 mg/dL (ref 8.9–10.3)
Chloride: 105 mmol/L (ref 98–111)
Creatinine, Ser: 1.08 mg/dL (ref 0.61–1.24)
GFR, Estimated: >60 mL/min (ref >60)
Glucose, Bld: 117 mg/dL — ABNORMAL HIGH (ref 70–99)
Potassium: 3.9 mmol/L (ref 3.5–5.1)
Sodium: 137 mmol/L (ref 135–145)
Total Bilirubin: 1 mg/dL (ref 0.3–1.2)
Total Protein: 7.5 g/dL (ref 6.5–8.1)

## 2021-07-03 LAB — RAPID URINE DRUG SCREEN, HOSP PERFORMED
Amphetamines: NOT DETECTED
Barbiturates: NOT DETECTED
Benzodiazepines: NOT DETECTED
Cocaine: NOT DETECTED
Opiates: NOT DETECTED
Tetrahydrocannabinol: NOT DETECTED

## 2021-07-03 NOTE — Telephone Encounter (Signed)
Pt currently in the hospital getting this addressed.

## 2021-07-03 NOTE — Telephone Encounter (Signed)
I spoke with the pt's wife, they are currently still at the Hospital. She says that he is doing okay and that they are waiting for lab results. I made her aware that referral was placed for Pulmonology. Information was given to her, because she speaks better English than pt.

## 2021-07-03 NOTE — Telephone Encounter (Signed)
Pt's wife called stating that at the last appt on 11/14, Ernest [name] told him that she would call him regarding a CPAP machine. Pt has not received information for the machine. She would like a call back. Please Advise.

## 2021-07-03 NOTE — Telephone Encounter (Signed)
FYI

## 2021-07-03 NOTE — ED Provider Notes (Signed)
Emergency Medicine Provider Triage Evaluation Note  Ernest [name] , a 35 y.o. male  was evaluated in triage.  Pt complains of blurred vision and dizziness x > 1 month.  Patient suffered from a CVA in the month of October, according to significant other at the bedside, his symptoms have been worsening.  They called his PCP as his vision now has worsened who recommended they be seen in the ED.  He reports he cannot see clear, has to read stuff very closely.  He also complains of "not being able to fall asleep at night ".  He does take plavix reports compliance with this.  Review of Systems  Positive: Vision changes, left arm weakness Negative: Headache, fever, loss of vision  Physical Exam  BP (!) 155/102 (BP Location: Left Arm)   Pulse 82   Temp 98.5 F (36.9 C) (Oral)   Resp 18   SpO2 100%  Gen:   Awake, no distress   Resp:  Normal effort  MSK:   Moves extremities without difficulty  Other:  Moves upper and lower extremities adequately, some decreased strength with the left arm noted.  Medical Decision Making  Medically screening exam initiated at 12:37 PM.  Appropriate orders placed.  Ernest [name] was informed that the remainder of the evaluation will be completed by another provider, this initial triage assessment does not replace that evaluation, and the importance of remaining in the ED until their evaluation is complete.  Used Ernest [name] services.  Lab has been ordered.   Ernest Manges, PA-C 07/03/21 1249    Ernest Logan, DO 07/04/21 1016

## 2021-07-03 NOTE — ED Provider Notes (Signed)
[facility] EMERGENCY DEPARTMENT Provider Note   CSN: [id number] Arrival date & time: 07/03/21  1122     History Chief Complaint  Patient presents with   Blurred Vision    Ernest [name] is a 35 y.o. male.  The history is provided by the patient. The history is limited by a language barrier. A language interpreter was used [phone]).  Eye Problem Location:  Both eyes Quality: blurred vision. Onset quality:  Gradual Duration:  1 month Timing:  Constant Progression:  Worsening Chronicity:  New Context: not contact lens problem and not direct trauma   Relieved by:  Nothing Worsened by:  Nothing Associated symptoms: blurred vision   Associated symptoms: no headaches and no vomiting      This has been present since recent stroke but is getting worse and difficulty differentiating "a kid from a dog" He also reports slurred speech and left hand weakness since recent stroke but nothing acutely worsened He also reports difficulty sleeping that is not new He is med compliant 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H[facility])    Stroke [facility])     Patient Active Problem List   Diagnosis Date Noted   Left-sided weakness 06/17/2021   Status post CVA 06/16/2021   Left arm weakness 06/16/2021   Paresthesia 05/25/2021   CVA (cerebral vascular accident) (HCC) 05/22/2021   Obesity, Class III, BMI 40-49.9 (morbid obesity) (HCC) 05/22/2021   Leukocytosis 05/22/2021   Hypocalcemia 05/22/2021   Transaminitis 05/22/2021   Compartment syndrome (HCC) 05/22/2021   Hyperlipidemia 05/22/2021   OSA (obstructive sleep apnea)    Smoker     Past Surgical History:  Procedure Laterality Date   BUBBLE STUDY  05/28/2021   Procedure: BUBBLE STUDY;  Surgeon: Chilton Si, MD;  Location: [facility] ENDOSCOPY;  Service: Cardiovascular;;   DECOMPRESSION FASCIOTOMY FOREARM Right    TEE WITHOUT CARDIOVERSION N/A 05/28/2021   Procedure:  TRANSESOPHAGEAL ECHOCARDIOGRAM (TEE);  Surgeon: Chilton Si, MD;  Location: [facility] ENDOSCOPY;  Service: Cardiovascular;  Laterality: N/A;       Family History  Problem Relation Age of Onset   Diabetes Neg Hx    Cancer Neg Hx    Heart failure Neg Hx    Hyperlipidemia Neg Hx    Hypertension Neg Hx     Social History   Tobacco Use   Smoking status: Every Day    Packs/day: 0.01    Types: Cigarettes   Smokeless tobacco: Never  Substance Use Topics   Alcohol use: Never   Drug use: Never    Home Medications Prior to Admission medications   Medication Sig Start Date End Date Taking? Authorizing Provider  aspirin 325 MG EC tablet Take 1 tablet (325 mg total) by mouth daily. 06/16/21   Dulce Sellar, NP  atorvastatin (LIPITOR) 80 MG tablet Take 1 tablet (80 mg total) by mouth daily. 06/16/21   Dulce Sellar, NP  clopidogrel (PLAVIX) 75 MG tablet Take 1 tablet (75 mg total) by mouth daily. 06/16/21   Dulce Sellar, NP    Allergies    Patient has no known allergies.  Review of Systems   Review of Systems  Constitutional:  Negative for fever.  Eyes:  Positive for blurred vision and visual disturbance. Negative for pain.       Denies diplopia, blurred vision in both eyes  Gastrointestinal:  Negative for vomiting.  Neurological:  Negative for headaches.       Difficulty sleeping, left hand  weakness since stroke  Psychiatric/Behavioral:  Positive for sleep disturbance.   All other systems reviewed and are negative.  Physical Exam Updated Vital Signs BP (!) 120/100   Pulse (!) 57   Temp 98.2 F (36.8 C)   Resp 18   SpO2 97%   Physical Exam CONSTITUTIONAL: Well developed/well nourished HEAD: Normocephalic/atraumatic EYES: EOMI/PERRL, no nystagmus, no ptosis, no corneal haziness, no conjunctival erythema or scleral icterus Visual acuity 20/80 in each eye ENMT: Mucous membranes moist NECK: supple no meningeal signs CV: S1/S2 noted, no murmurs/rubs/gallops  noted LUNGS: Lungs are clear to auscultation bilaterally, no apparent distress ABDOMEN: soft, nontender, no rebound or guarding GU:no cva tenderness NEURO:Awake/alert, mild left facial droop, no arm or leg drift is noted Equal 5/5 strength with shoulder abduction, elbow flex/extension, wrist flex/extension in upper extremities and equal hand grips bilaterally Equal 5/5 strength with hip flexion,knee flex/extension, foot dorsi/plantar flexion Sensation to light touch intact in all extremities EXTREMITIES: pulses normal, full ROM SKIN: warm, color normal PSYCH: no abnormalities of mood noted  ED Results / Procedures / Treatments   Labs (all labs ordered are listed, but only abnormal results are displayed) Labs Reviewed  COMPREHENSIVE METABOLIC PANEL - Abnormal; Notable for the following components:      Result Value   Glucose, Bld 117 (*)    AST 49 (*)    ALT 54 (*)    All other components within normal limits  CBC WITH DIFFERENTIAL/PLATELET  RAPID URINE DRUG SCREEN, HOSP PERFORMED    EKG EKG Interpretation  Date/Time:  Thursday July 03 2021 11:54:02 EST Ventricular Rate:  68 PR Interval:  132 QRS Duration: 86 QT Interval:  364 QTC Calculation: 387 R Axis:   44 Text Interpretation: Normal sinus rhythm Nonspecific T wave abnormality Abnormal ECG Confirmed by Ripley Fraise [phone]) on 07/03/2021 11:23:15 PM  Radiology No results found.  Procedures Procedures   Medications Ordered in ED Medications - No data to display  ED Course  I have reviewed the triage vital signs and the nursing notes.  Pertinent labs results that were available during my care of the patient were reviewed by me and considered in my medical decision making (see chart for details).    MDM Rules/Calculators/A&P                           Patient presents for evaluation for blurred vision.  Patient had a basal ganglia stroke back in October with residual facial weakness and hand weakness. He  reports that he has had increasing blurred vision since the stroke.  It seems to be worse over the past 2 weeks.  He called his PCP who instructed him to go to the ER. I spent a significant time with patient discussing his symptoms with the nepali interpreter and it does not appear that anything acute is occurring tonight.  There are no emergent neurologic symptoms or findings I spoke to Dr. Midge Aver  with ophthalmology.  He will see patient at 9 AM today.  This was discussed with patient and family.  He is safe for discharge  Final Clinical Impression(s) / ED Diagnoses Final diagnoses:  Blurred vision    Rx / DC Orders ED Discharge Orders     None        Ripley Fraise, MD 07/04/21 [phone]

## 2021-07-03 NOTE — ED Triage Notes (Signed)
Patient here with complaint of blurred vision and dizziness that started one month ago after the patient had a stroke. Patient states blurred vision has gotten worse over the last month. Patient takes plavix. Patient is alert, oriented, and in no apparent distress at this time.

## 2021-07-03 NOTE — Telephone Encounter (Signed)
Nurse Assessment Nurse: Yetta Barre, RN, Miranda Date/Time (Eastern Time): 07/03/2021 10:24:49 AM Confirm and document reason for call. If symptomatic, describe symptoms. ---Caller states her husband has had a sudden change in his vision. He cannot see clearly. This started 2-3 weeks ago. The skin around his eyes is starting to look yellow.  Does the patient have any new or worsening symptoms? ---Yes Will a triage be completed? ---Yes Related visit to physician within the last 2 weeks? ---No Does the PT have any chronic conditions? (i.e. diabetes, asthma, this includes High risk factors for pregnancy, etc.) ---Yes List chronic conditions. ---Stroke 1-2 months ago, High Cholesterol Is this a behavioral health or substance abuse call? ---No  Vision Loss or Change Double vision Yetta Barre RN, Miranda 07/03/2021 10:30:16  07/03/2021 10:23:27 AM Send to Urgent Queue Salvatore Marvel 07/03/2021 10:35:31 AM Go to ED Now (or PCP triage) Yes Yetta Barre, RN, Miranda

## 2021-07-10 ENCOUNTER — Ambulatory Visit (INDEPENDENT_AMBULATORY_CARE_PROVIDER_SITE_OTHER): Payer: PRIVATE HEALTH INSURANCE | Admitting: Adult Health

## 2021-07-10 ENCOUNTER — Encounter: Payer: Self-pay | Admitting: Adult Health

## 2021-07-10 VITALS — BP 140/91 | HR 71 | Ht 63.0 in | Wt 228.0 lb

## 2021-07-10 DIAGNOSIS — I639 Cerebral infarction, unspecified: Secondary | ICD-10-CM

## 2021-07-10 DIAGNOSIS — G4733 Obstructive sleep apnea (adult) (pediatric): Secondary | ICD-10-CM

## 2021-07-10 DIAGNOSIS — E785 Hyperlipidemia, unspecified: Secondary | ICD-10-CM | POA: Diagnosis not present

## 2021-07-10 DIAGNOSIS — Q2112 Patent foramen ovale: Secondary | ICD-10-CM | POA: Diagnosis not present

## 2021-07-10 MED ORDER — MECLIZINE HCL 12.5 MG PO TABS: 12.5000 mg | ORAL_TABLET | Freq: Three times a day (TID) | ORAL | 5 refills | Status: DC | PRN

## 2021-07-10 NOTE — Progress Notes (Signed)
[facility] [address] [place]. [place] [address] [phone]       HOSPITAL FOLLOW UP NOTE  Mr. [name] Date of Birth:  1985/11/30 Medical Record Number:  [id number]   Reason for Referral:  hospital stroke follow up    SUBJECTIVE:   CHIEF COMPLAINT:  Chief Complaint  Patient presents with   Follow-up    RM 3 with girlfriend Saprana  (who is interpreting) Pt is well, pt & girlfriend states he is having blurred vision, L hand weakness and dizziness when walking    HPI:   [name] is a 35 y.o. 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  He presented to the ED on 05/22/2021 after awakening around 0130 with left sided weakness, left sided facial droop and dysphasia.  Symptoms resolved spontaneously, but patient and significant other state that they come and go.  Personally reviewed hospitalization pertinent progress notes, lab work and imaging.  Evaluated by Dr. 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  CTA performed reveals narrowing of the right distal M1 segment of the MCA.  EEG performed was negative for epileptiform discharges.  EF 60 to 65%.  LDL 120.  A1c 5.5.  Recommended DAPT for 3 months due to intracranial stenosis and aspirin alone as well as initiated atorvastatin 80 mg daily.  Smoking cessation counseling provided.  PT/OT no therapy needs and discharged home on 05/23/2021.  He returned to ED on 05/24/2021 for worsening confusion, slurred speech, left facial weakness and left hand numbness.  Repeat MRI no significant change from recent imaging or evidence of new stroke.  Evaluated by Dr. Leonie Man who felt recent infarct likely cryptogenic given absence of significant arthrosclerosis elsewhere.  LE Doppler negative for DVT.  TCD bubble negative for PFO.  TEE small PFO, no  evidence of thrombus.  ANA and anticardiolipin antibodies negative. Known OSA dx'd over 1 year ago at [facility] but never started CPAP - he was advised to follow back up with them to obtain CPAP.  Recommended continuation of DAPT and statin and discharged home on 05/28/2021   Today, 07/10/2021, being seen for initial hospital follow-up accompanied by his girlfriend who assists with interpretation.  Interpreter waiver signed.  C/o blurred vision - seen by eye doctor and currently waiting for rx glasses  C/o dizziness after walking for a few minutes.describes this as room spinning sensation.  Will feel off balance at times Left arm weakness - some improvement but does feel increased tightness at night Occasional slurring  No new stroke/TIA symptoms  He has not participated in any type of therapies.  He is currently not working -previously working as a Freight forwarder.  Requesting FMLA paperwork to be completed.  He is currently applying for disability and Medicaid. He and g/f are concerned regarding cost of doing therapy at this point.   Compliant on Plavix and atorvastatin 80 mg daily -denies side effects Blood pressure today 140/91 -does not routinely monitor at home Appointment pulmonology 08/05/2021 for CPAP  No further concerns at this time    [place]  Per hospitalization 05/22/2021 Code Stroke CT head No acute abnormality. ASPECTS 10.  CTA head & neck narrowing and irregularity of the right distal M1 segment of the MCA MRI brain done shows 3.4 cm acute infarct within the right basal ganglia and right corona radiata. MRA  head shows moderate stenosis within the distal M1 right middle cerebral artery. No M2 proximal branch occlusion or high-grade proximal stenosis is identified 2D Echo EF 60 to 65%  Per hospitalization 05/24/2021 CTH subacute appearing right basal ganglia and corona radiata infarction MR Brain right BG infarct with no significant regional extension, no new  intracranial abnormality 2D Echo EF 60-65%. No atrial shunt TCD no evidence of PFO ANA and anticardiolipin antibodies negative TEE EF 60 to 65%, no evidence of thrombus, evidence of small PFO lower extremities negative for DVT TCD with bubble study negative for right-to-left shunt EEG no seizure activity UDS negative      ROS:   14 system review of systems performed and negative with exception of those listed in HPI  PMH:  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[place])    Stroke ([place])     PSH:  Past Surgical History:  Procedure Laterality Date   BUBBLE STUDY  05/28/2021   Procedure: BUBBLE STUDY;  Surgeon: Skeet Latch, MD;  Location: [place];  Service: Cardiovascular;;   DECOMPRESSION FASCIOTOMY FOREARM Right    TEE WITHOUT CARDIOVERSION N/A 05/28/2021   Procedure: TRANSESOPHAGEAL ECHOCARDIOGRAM (TEE);  Surgeon: Skeet Latch, MD;  Location: [facility] ENDOSCOPY;  Service: Cardiovascular;  Laterality: N/A;    Social History:  Social History   Socioeconomic History   Marital status: Significant Other    Spouse name: Not on file   Number of children: Not on file   Years of education: Not on file   Highest education level: Not on file  Occupational History   Not on file  Tobacco Use   Smoking status: Every Day    Packs/day: 0.01    Types: Cigarettes   Smokeless tobacco: Never  Substance and Sexual Activity   Alcohol use: Never   Drug use: Never   Sexual activity: Not on file  Other Topics Concern   Not on file  Social History Narrative   Not on file   Social Determinants of Health   Financial Resource Strain: Not on file  Food Insecurity: Not on file  Transportation Needs: Not on file  Physical Activity: Not on file  Stress: Not on file  Social Connections: Not on file  Intimate Partner Violence: Not on file    Family History:  Family History  Problem Relation Age of Onset   Diabetes Neg Hx    Cancer Neg Hx     Heart failure Neg Hx    Hyperlipidemia Neg Hx    Hypertension Neg Hx     Medications:   Current Outpatient Medications on File Prior to Visit  Medication Sig Dispense Refill   atorvastatin (LIPITOR) 80 MG tablet Take 1 tablet (80 mg total) by mouth daily. 30 tablet 3   clopidogrel (PLAVIX) 75 MG tablet Take 1 tablet (75 mg total) by mouth daily. 30 tablet 1   No current facility-administered medications on file prior to visit.    Allergies:  No Known Allergies    OBJECTIVE:  Physical Exam  Vitals:   07/10/21 0942  BP: (!) 140/91  Pulse: 71  Weight: 228 lb (103.4 kg)  Height: 5\' 3"  (1.6 m)   Body mass index is 40.39 kg/m. No results found.  Post stroke PHQ 2/9 Depression screen PHQ 2/9 06/16/2021  Decreased Interest 0  Down, Depressed, Hopeless 0  PHQ - 2 Score 0     General: well developed, well nourished, pleasant middle-age male, seated, in no  evident distress Head: head normocephalic and atraumatic.   Neck: supple with no carotid or supraclavicular bruits Cardiovascular: regular rate and rhythm, no murmurs Musculoskeletal: no deformity Skin:  no rash/petichiae Vascular:  Normal pulses all extremities   Neurologic Exam Mental Status: Awake and fully alert.  Possible slight slurred speech but strong accent.  Oriented to place and time. Recent and remote memory intact. Attention span, concentration and fund of knowledge appropriate. Mood and affect appropriate.  Cranial Nerves: Fundoscopic exam reveals sharp disc margins. Pupils equal, briskly reactive to light. Extraocular movements full without nystagmus. Visual fields full to confrontation. Hearing intact. Facial sensation intact. Left lower facial weakness. tongue, palate moves normally and symmetrically.  Motor: Normal bulk and tone. Normal strength in all tested extremity muscles except mild LUE weakness greater distally Sensory.: intact to touch , pinprick , position and vibratory sensation.   Coordination: Rapid alternating movements normal in all extremities except slightly decreased left hand. Finger-to-nose and heel-to-shin performed accurately bilaterally. Mildly orbits right arm over left arm Gait and Station: Arises from chair without difficulty. Stance is normal. Gait demonstrates normal stride length and balance with out use of AD. Tandem walk and heel toe with mild difficulty.  Romberg negative Reflexes: 1+ and symmetric. Toes downgoing.     NIHSS  1 Modified Rankin  2      ASSESSMENT: Ernest [name] is a 35 y.o. year old male with recent right BG/CR infarct on 05/22/2021 initially felt to be due to large vessel disease with distal right M1 stenosis per Dr. Erlinda Hong but represented on 05/24/2021 with worsening symptoms without evidence of new stroke or extension of prior stroke - eval by Dr. Leonie Man who felt more likely etiology cryptogenic given absence of significant arthrosclerosis elsewhere. Vascular risk factors include HLD, tobacco use, obesity and OSA not on CPAP.      PLAN:  Right BG/CR infarct:  Residual deficit: mild LUE weakness, facial weakness and dizziness/vertigo. He wishes to hold off on therapies until Medicaid approved. Trial of meclizine 12.5 mg 3 times daily as needed for dizziness which has been greatly limiting daily functioning.  Willing to assist with FMLA but due to cost of completing paperwork, they plan on discussing with PCP first Continue clopidogrel 75 mg daily  and atorvastatin 80 mg daily for secondary stroke prevention.   Discussed secondary stroke prevention measures and importance of close PCP follow up for aggressive stroke risk factor management. I have gone over the pathophysiology of stroke, warning signs and symptoms, risk factors and their management in some detail with instructions to go to the closest emergency room for symptoms of concern. PFO: as evidenced on TEE. Will reach out to Dr. Leonie Man re: indication for PFO closure - small PFO  per report although high rope score HLD: LDL goal <70. Recent LDL 120.  Continue atorvastatin 80 mg daily. Plans to repeat lipid panel with PCP OSA: has f/u with pulmonology 08/05/2021 to discuss initiating CPAP    Follow up in 4 months or call earlier if needed   CC:  [facility] provider: Dr. Leonie Man PCP: Jeanie Sewer, NP    I spent 59 minutes of face-to-face and non-face-to-face time with patient and girlfriend.  This included previsit chart review including review of recent hospitalization, lab review, study review, electronic health record documentation, patient and girlfriend education regarding recent stroke including potential etiology, secondary stroke prevention measures and importance of managing stroke risk factors, residual deficits and typical recovery time and answered all other questions to patient  and girlfriend's satisfaction  Ihor Austin, AGNP-BC  [facility] [address] [place], [place] [address]  Phone [phone] Fax [phone] Note: This document was prepared with digital dictation and possible smart phrase technology. Any transcriptional errors that result from this process are unintentional.

## 2021-07-10 NOTE — Patient Instructions (Signed)
Continue clopidogrel 75 mg daily  and atorvastatin 80mg  daily  for secondary stroke prevention  Continue to follow up with PCP regarding cholesterol and blood pressure management  Maintain strict control of hypertension with blood pressure goal below 130/90 and cholesterol with LDL cholesterol (bad cholesterol) goal below 70 mg/dL.   Once you get Medicaid, please let know so we can get you started working with therapies  Try meclizine 12.5mg  three times daily AS NEEDED for dizziness/spinning sensation   Follow up with pulmonology on 08/05/2021 for sleep apnea - untreated sleep apnea increases risk of additional strokes  I will follow up with Dr. 10/03/2021 regarding your PFO that was found during testing - we will let you know if your PFO could have contributed to your stroke and to be further evaluated by cardiology      Followup in the future with me in 4 months or call earlier if needed       Thank you for coming to see Pearlean Brownie at [facility]. I hope we have been able to provide you high quality care today.  You may receive a patient satisfaction survey over the next few weeks. We would appreciate your feedback and comments so that we may continue to improve ourselves and the health of our patients.

## 2021-07-11 NOTE — Progress Notes (Signed)
I agree with the above plan 

## 2021-07-14 ENCOUNTER — Telehealth: Payer: Self-pay | Admitting: Adult Health

## 2021-07-14 ENCOUNTER — Inpatient Hospital Stay: Payer: Self-pay | Admitting: Adult Health

## 2021-07-14 NOTE — Telephone Encounter (Signed)
At recent visit, discussed evidence of PFO and possible need of closure. Per Dr. Pearlean Brownie, "I have reviewed his chart.  TCD bubble study was negative so the PFO seen on TEE is unlikely to be clinically significant.  No need for referral to cardiology"  Can you please let patient and girlfriend know? Thank you!

## 2021-07-21 ENCOUNTER — Ambulatory Visit: Payer: PRIVATE HEALTH INSURANCE | Admitting: Family

## 2021-07-24 ENCOUNTER — Ambulatory Visit (INDEPENDENT_AMBULATORY_CARE_PROVIDER_SITE_OTHER): Payer: PRIVATE HEALTH INSURANCE | Admitting: Family

## 2021-07-24 ENCOUNTER — Encounter: Payer: Self-pay | Admitting: Family

## 2021-07-24 VITALS — BP 129/89 | HR 77 | Temp 98.2°F | Ht 63.0 in | Wt 223.6 lb

## 2021-07-24 DIAGNOSIS — Z8673 Personal history of transient ischemic attack (TIA), and cerebral infarction without residual deficits: Secondary | ICD-10-CM | POA: Diagnosis not present

## 2021-07-24 DIAGNOSIS — Q2112 Patent foramen ovale: Secondary | ICD-10-CM | POA: Diagnosis not present

## 2021-07-24 DIAGNOSIS — R29898 Other symptoms and signs involving the musculoskeletal system: Secondary | ICD-10-CM

## 2021-07-24 DIAGNOSIS — R42 Dizziness and giddiness: Secondary | ICD-10-CM | POA: Diagnosis not present

## 2021-07-24 NOTE — Assessment & Plan Note (Addendum)
seen by Neuro, ordered Meclizine for pt reported dizziness, want him referred to cardiology for PFO found on TEE, sending referral today. pt educated again on importance of taking medications daily, trying to lose weight, and s/s of possible subsequent CVA. has f/u app in 4 mos with Neuro & Pulmonology on 1/3 for CPAP orders. pt requesting FMLA paperwork completed as he is unable to return to work at this time.

## 2021-07-24 NOTE — Assessment & Plan Note (Signed)
mostly left hand, has not improved yet, not seen by PT, but pt GF is helping him with exercises at home.

## 2021-07-24 NOTE — Assessment & Plan Note (Signed)
new onset - s/p CVA - discussed with Neuro and started on Meclizine which has helped some but causes drowsiness, advised cutting pills in half. Neuro also wanting him f/u for PFO thru Cardiology.

## 2021-07-24 NOTE — Assessment & Plan Note (Addendum)
found on hospital TEE after pt CVA, unsure if causing pt dizziness or sequelae from CVA. pt requesting FMLA paperwork completed, advised pt will notify when ready.

## 2021-07-24 NOTE — Progress Notes (Signed)
Subjective:     Patient ID: Ernest [name], male    DOB: [birth year], 35 y.o.   MRN: [id number]  Chief Complaint  Patient presents with   Form Completion    FMLA-Dizziness   *Due to language barrier, an interpreter was present via telephone during the history-taking and subsequent discussion (and for part of the physical exam) with this patient.    HPI:  LEFT sided weakness: pt had a CVA 1.5 month ago, still having residual weakness on his left side, mostly left hand. He also reports vision changes, has been seen by ophthalmology and no concerns found other than needing glasses.  Reports he has not had any physical therapy to date due to cost, his girlfriend is helping him at home with strengthening exercises. He has had a follow up appt with NEURO and will see again in 4 months, they recommend he see a cardiologist for the PFO found on TEE. He also has some slurred speech that is resolving, no other new symptoms. He reports he is taking his daily Lipitor, and Plavix, and ASA was discontinued. He has been unable to return to work. He is requesting FMLA paperwork completed today. Dizziness He reports new onset dizziness. He describes it as feeling like room is spinning, occurs intermittently, and typically lasts a few minutes.  It typically occurs when he is lying back from sitting position, sitting up from lying position, sitting still, standing up from siting position, and walking. It is usually relieved by rest and Meclizine helps sometimes . He has started new medications around the time the dizziness started, but these are not normally known to cause this sx.  Health Maintenance Due  Topic Date Due   Pneumococcal Vaccine 52-45 Years old (1 - PCV) Never done   TETANUS/TDAP  Never done    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[facility])    Left-sided weakness 06/17/2021    Past Surgical History:  Procedure Laterality Date   BUBBLE STUDY  05/28/2021    Procedure: BUBBLE STUDY;  Surgeon: Chilton Si, MD;  Location: [facility] ENDOSCOPY;  Service: Cardiovascular;;   DECOMPRESSION FASCIOTOMY FOREARM Right    TEE WITHOUT CARDIOVERSION N/A 05/28/2021   Procedure: TRANSESOPHAGEAL ECHOCARDIOGRAM (TEE);  Surgeon: Chilton Si, MD;  Location: [facility] ENDOSCOPY;  Service: Cardiovascular;  Laterality: N/A;    Outpatient Medications Prior to Visit  Medication Sig Dispense Refill   atorvastatin (LIPITOR) 80 MG tablet Take 1 tablet (80 mg total) by mouth daily. 30 tablet 3   clopidogrel (PLAVIX) 75 MG tablet Take 1 tablet (75 mg total) by mouth daily. 30 tablet 1   meclizine (ANTIVERT) 12.5 MG tablet Take 1 tablet (12.5 mg total) by mouth 3 (three) times daily as needed for dizziness. 90 tablet 5   No facility-administered medications prior to visit.    No Known Allergies      Objective:    Physical Exam Vitals and nursing note reviewed.  Constitutional:      General: He is not in acute distress.    Appearance: Normal appearance. He is obese.  HENT:     Head: Normocephalic.  Cardiovascular:     Rate and Rhythm: Normal rate and regular rhythm.  Pulmonary:     Effort: Pulmonary effort is normal.     Breath sounds: Normal breath sounds.  Musculoskeletal:        General: Normal range of motion.     Cervical back: Normal range of motion.  Skin:  General: Skin is warm and dry.  Neurological:     Mental Status: He is alert and oriented to person, place, and time.     Motor: Weakness (left hand) present.  Psychiatric:        Mood and Affect: Mood normal.    BP 129/89    Pulse 77    Temp 98.2 F (36.8 C) (Temporal)    Ht 5\' 3"  (1.6 m)    Wt 223 lb 9.6 oz (101.4 kg)    SpO2 96%    BMI 39.61 kg/m  Wt Readings from Last 3 Encounters:  07/24/21 223 lb 9.6 oz (101.4 kg)  07/10/21 228 lb (103.4 kg)  06/16/21 236 lb (107 kg)       Assessment & Plan:   Problem List Items Addressed This Visit       Cardiovascular and Mediastinum   PFO  (patent foramen ovale)    found on hospital TEE after pt CVA, unsure if causing pt dizziness or sequelae from CVA. pt requesting FMLA paperwork completed, advised pt will notify when ready.      Relevant Orders   Ambulatory referral to Cardiology     Nervous and Auditory   Left arm weakness - Primary    mostly left hand, has not improved yet, not seen by PT, but pt GF is helping him with exercises at home.        Other   Status post CVA    seen by Neuro, ordered Meclizine for pt reported dizziness, want him referred to cardiology for PFO found on TEE, sending referral today. pt educated again on importance of taking medications daily, trying to lose weight, and s/s of possible subsequent CVA. has f/u app in 4 mos with Neuro & Pulmonology on 1/3 for CPAP orders. pt requesting FMLA paperwork completed as he is unable to return to work at this time.      Dizziness    new onset - s/p CVA - discussed with Neuro and started on Meclizine which has helped some but causes drowsiness, advised cutting pills in half. Neuro also wanting him f/u for PFO thru Cardiology.      Extra time (30 min.) spent with patient today which consisted of chart review, discussing diagnoses, work up, treatment, answering questions, and documentation.

## 2021-07-24 NOTE — Patient Instructions (Addendum)
It was nice to see you today!  I have sent a referral to [facility] and they will call you directly for an appointment. Continue taking all your meds and let us know when refills needed if before your next appt. in 3 months. Refer to the attached handout on signs & symptoms of a stroke.  Hope you have a happy holiday!  PLEASE NOTE:  If you had any lab tests please let us know if you have not heard back within a few days. You may see your results on MyChart before we have a chance to review them but we will give you a call once they are reviewed by [place]. If we ordered any referrals today, please let us know if you have not heard from their office within the next week.   Please try these tips to maintain a healthy lifestyle:  Eat most of your calories during the day when you are active. Eliminate processed foods including packaged sweets (pies, cakes, cookies), reduce intake of potatoes, white bread, white pasta, and white rice. Look for whole grain options, oat flour or almond flour.  Each meal should contain half fruits/vegetables, one quarter protein, and one quarter carbs (no bigger than a computer mouse).  Cut down on sweet beverages. This includes juice, soda, and sweet tea. Also watch fruit intake, though this is a healthier sweet option, it still contains natural sugar! Limit to 3 servings daily.  Drink at least 1 glass of water with each meal and aim for at least 8 glasses per day  Exercise at least 150 minutes every week.

## 2021-08-03 NOTE — Progress Notes (Signed)
08/05/21- 35 yoM Smoker  from [place], for sleep evaluation courtesy of Dulce Sellar, NP with concern of OSA Medical problem list includes CVA, Hx Seizure, Patent Foramen Ovale, OSA, Obesity, Hypocalcemia, Hyperlipidemia,  Epworth score-3 Body weight today- Covid vax-2 Phizer Flu vax-had ------Patient reports he has been snoring worse in last year.  Here with wife/girlfriend.  They report he snores loudly.  Unemployed.  No ENT surgery.  History of patent foramen ovale and surgical repair was recommended in the past but he did not do it.  He had a sleep study last year in [place].  We are requesting results.  He was told he should have CPAP but he chose to defer and then to come here to get CPAP initiated. As I finished discussion of OSA, medical concerns and treatment options, his girlfriend/wife indicated an interpreter would be helpful, having not raised the issue earlier.  I gave him an [place] patient handout on sleep apnea which she indicated they could read.  We will work to have interpreter here in the future.  Prior to Admission medications   Medication Sig Start Date End Date Taking? Authorizing Provider  atorvastatin (LIPITOR) 80 MG tablet Take 1 tablet (80 mg total) by mouth daily. 06/16/21  Yes Dulce Sellar, NP  clopidogrel (PLAVIX) 75 MG tablet Take 1 tablet (75 mg total) by mouth daily. 06/16/21  Yes Dulce Sellar, NP  meclizine (ANTIVERT) 12.5 MG tablet Take 1 tablet (12.5 mg total) by mouth 3 (three) times daily as needed for dizziness. 07/10/21  Yes Ihor Austin, NP   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HCC)    Left-sided weakness 06/17/2021   Past Surgical History:  Procedure Laterality Date   BUBBLE STUDY  05/28/2021   Procedure: BUBBLE STUDY;  Surgeon: Chilton Si, MD;  Location: [facility] ENDOSCOPY;  Service: Cardiovascular;;   DECOMPRESSION FASCIOTOMY FOREARM Right    TEE WITHOUT CARDIOVERSION N/A 05/28/2021    Procedure: TRANSESOPHAGEAL ECHOCARDIOGRAM (TEE);  Surgeon: Chilton Si, MD;  Location: [facility] ENDOSCOPY;  Service: Cardiovascular;  Laterality: N/A;   Family History  Problem Relation Age of Onset   Diabetes Neg Hx    Cancer Neg Hx    Heart failure Neg Hx    Hyperlipidemia Neg Hx    Hypertension Neg Hx    Social History   Socioeconomic History   Marital status: Significant Other    Spouse name: Not on file   Number of children: Not on file   Years of education: Not on file   Highest education level: Not on file  Occupational History   Not on file  Tobacco Use   Smoking status: Former    Packs/day: 0.01    Types: Cigarettes    Start date: 08/03/2005    Quit date: 05/03/2021    Years since quitting: 0.2   Smokeless tobacco: Never  Substance and Sexual Activity   Alcohol use: Never   Drug use: Never   Sexual activity: Not on file  Other Topics Concern   Not on file  Social History Narrative   Not on file   Social Determinants of Health   Financial Resource Strain: Not on file  Food Insecurity: Not on file  Transportation Needs: Not on file  Physical Activity: Not on file  Stress: Not on file  Social Connections: Not on file  Intimate Partner Violence: Not on file   ROS-see HPI  + = positive Constitutional:    weight loss, night sweats, fevers,  chills, fatigue, lassitude. HEENT:    headaches, difficulty swallowing, tooth/dental problems, sore throat,       sneezing, itching, ear ache, nasal congestion, post nasal drip, snoring CV:    chest pain, orthopnea, PND, swelling in lower extremities, anasarca,                                   dizziness, palpitations Resp:   +shortness of breath with exertion or at rest.                productive cough,   non-productive cough, coughing up of blood.              change in color of mucus.  wheezing.   Skin:    rash or lesions. GI:  No-   heartburn, indigestion, abdominal pain, nausea, vomiting, diarrhea,                  change in bowel habits, loss of appetite GU: dysuria, change in color of urine, no urgency or frequency.   flank pain. MS:   joint pain, stiffness, decreased range of motion, back pain. Neuro-    L hand numb on waking in AM-? impingement Psych:  change in mood or affect.  depression or anxiety.   memory loss.  OBJ- Physical Exam General- Alert, Oriented, Affect-appropriate, Distress- none acute, + obese Skin- rash-none, lesions- none, excoriation- none Lymphadenopathy- none Head- atraumatic            Eyes- Gross vision intact, PERRLA, conjunctivae and secretions clear            Ears- Hearing, canals-normal            Nose- Clear, no-Septal dev, mucus, polyps, erosion, perforation             Throat- Mallampati III-IV , mucosa clear , drainage- none, tonsils+, teeth+ Neck- flexible , trachea midline, no stridor , thyroid nl, carotid no bruit Chest - symmetrical excursion , unlabored           Heart/CV- RRR , +no murmur heard, no gallop  , no rub, nl s1 s2                           - JVD- none , edema- none, stasis changes- none, varices- none           Lung- clear to P&A, wheeze- none, cough- none , dullness-none, rub- none           Chest wall-  Abd-  Br/ Gen/ Rectal- Not done, not indicated Extrem- cyanosis- none, clubbing, none, atrophy- none, strength- nl Neuro- grossly intact to observation

## 2021-08-05 ENCOUNTER — Ambulatory Visit (INDEPENDENT_AMBULATORY_CARE_PROVIDER_SITE_OTHER): Payer: PRIVATE HEALTH INSURANCE | Admitting: Internal Medicine

## 2021-08-05 ENCOUNTER — Encounter: Payer: Self-pay | Admitting: Internal Medicine

## 2021-08-05 ENCOUNTER — Telehealth: Payer: Self-pay

## 2021-08-05 ENCOUNTER — Other Ambulatory Visit: Payer: Self-pay

## 2021-08-05 DIAGNOSIS — G4733 Obstructive sleep apnea (adult) (pediatric): Secondary | ICD-10-CM | POA: Diagnosis not present

## 2021-08-05 DIAGNOSIS — Q2112 Patent foramen ovale: Secondary | ICD-10-CM

## 2021-08-05 NOTE — Assessment & Plan Note (Signed)
He is obese and a sustained educational intervention may be very beneficial.  Consider referral to a formal program like [facility] if appropriate.

## 2021-08-05 NOTE — Patient Instructions (Addendum)
Order- record release for sleep study from [facility]  When we have that, we will order you a CPAP machine through a DME (home care company)  In the future when you come, we will work with you to have a [place] speaking interpreter.  Please call if we can help.

## 2021-08-05 NOTE — Assessment & Plan Note (Signed)
He says surgical intervention to correct PFO was recommended but he chose not to proceed.

## 2021-08-05 NOTE — Assessment & Plan Note (Signed)
He had a sleep study in [place] in 2021.  This may be sufficient since he never did get CPAP and is only now starting treatment.  Treatment information given.  Anticipate CPAP once we have documentation.

## 2021-08-06 NOTE — Telephone Encounter (Signed)
Error

## 2021-08-14 ENCOUNTER — Other Ambulatory Visit: Payer: Self-pay

## 2021-08-14 DIAGNOSIS — Z8673 Personal history of transient ischemic attack (TIA), and cerebral infarction without residual deficits: Secondary | ICD-10-CM

## 2021-08-14 MED ORDER — CLOPIDOGREL BISULFATE 75 MG PO TABS: 75.0000 mg | ORAL_TABLET | Freq: Every day | ORAL | 0 refills | Status: DC

## 2021-08-25 ENCOUNTER — Ambulatory Visit (INDEPENDENT_AMBULATORY_CARE_PROVIDER_SITE_OTHER): Payer: PRIVATE HEALTH INSURANCE | Admitting: Family

## 2021-08-25 ENCOUNTER — Encounter: Payer: Self-pay | Admitting: Family

## 2021-08-25 VITALS — BP 122/72 | HR 77 | Temp 98.7°F | Ht 63.0 in | Wt 213.0 lb

## 2021-08-25 DIAGNOSIS — Z8673 Personal history of transient ischemic attack (TIA), and cerebral infarction without residual deficits: Secondary | ICD-10-CM

## 2021-08-25 DIAGNOSIS — R42 Dizziness and giddiness: Secondary | ICD-10-CM

## 2021-08-25 NOTE — Progress Notes (Signed)
Subjective:     Patient ID: Ernest [name], male    DOB: July 18, 1986, 35 y.o.   MRN: [id number]  Chief Complaint  Patient presents with   Surgical Clearance    Pt is not sick. Pt is requesting permission to receive  "cold" Stress testing    Dizziness    *Due to language barrier, an interpreter was present in person during the history-taking and subsequent discussion (and for part of the physical exam) with this patient.    HPI:  Need for stress test: pt seeing Dr. Montserrat in [place] (who did surgery in past on his right arm) and he is requesting a cold stress test to determine if he can have work at his job again. pt has applied for disability and they are handling his medical requirements. Dizziness He reports new onset dizziness. He describes it as feeling like room is spinning, occurs intermittently, and typically lasts a few minutes.  It typically occurs when he is lying back from sitting position, sitting up from lying position, sitting still, standing up from siting position, and walking. It is usually relieved by rest and Meclizine helps sometimes. He has started new medications around the time the dizziness started, but these are not normally known to cause this sx.    Health Maintenance Due  Topic Date Due   TETANUS/TDAP  Never done    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[place])    Left-sided weakness 06/17/2021    Past Surgical History:  Procedure Laterality Date   BUBBLE STUDY  05/28/2021   Procedure: BUBBLE STUDY;  Surgeon: Skeet Latch, MD;  Location: [place];  Service: Cardiovascular;;   DECOMPRESSION FASCIOTOMY FOREARM Right    TEE WITHOUT CARDIOVERSION N/A 05/28/2021   Procedure: TRANSESOPHAGEAL ECHOCARDIOGRAM (TEE);  Surgeon: Skeet Latch, MD;  Location: [facility] ENDOSCOPY;  Service: Cardiovascular;  Laterality: N/A;    Outpatient Medications Prior to Visit  Medication Sig Dispense Refill   atorvastatin  (LIPITOR) 80 MG tablet Take 1 tablet (80 mg total) by mouth daily. 30 tablet 3   clopidogrel (PLAVIX) 75 MG tablet Take 1 tablet (75 mg total) by mouth daily. 90 tablet 0   meclizine (ANTIVERT) 12.5 MG tablet Take 1 tablet (12.5 mg total) by mouth 3 (three) times daily as needed for dizziness. 90 tablet 5   No facility-administered medications prior to visit.    No Known Allergies      Objective:    Physical Exam Vitals and nursing note reviewed.  Constitutional:      General: He is not in acute distress.    Appearance: Normal appearance. He is obese.  HENT:     Head: Normocephalic.  Cardiovascular:     Rate and Rhythm: Normal rate and regular rhythm.  Pulmonary:     Effort: Pulmonary effort is normal.     Breath sounds: Normal breath sounds.  Musculoskeletal:        General: Normal range of motion.     Cervical back: Normal range of motion.  Skin:    General: Skin is warm and dry.  Neurological:     Mental Status: He is alert and oriented to person, place, and time.  Psychiatric:        Mood and Affect: Mood normal.    BP 122/72    Pulse 77    Temp 98.7 F (37.1 C) (Temporal)    Ht 5\' 3"  (1.6 m)    Wt 213 lb (96.6 kg)  SpO2 97%    BMI 37.73 kg/m  Wt Readings from Last 3 Encounters:  08/25/21 213 lb (96.6 kg)  08/05/21 222 lb (100.7 kg)  07/24/21 223 lb 9.6 oz (101.4 kg)       Assessment & Plan:   Problem List Items Addressed This Visit       Other   Status post CVA    pt still having weakness, has filed for disability and he is seeing Dr. Montserrat who did surgery on him in [place] and needs to do a cold stress test on him related to the disability physical. pt GF states they need me to say it is safe for him to have study. pt has not contraindications that I see to having this done.      Dizziness - Primary    still not seen by Cardiology, per referral note they have tried to reach pt several times. Phone number to their office provided today and pt & GF  present encouraged to call their office.      *Extra time (40min) spent with patient today which consisted of chart review, working with an interpreter, discussing diagnoses, work up, answering questions, and documentation.

## 2021-08-25 NOTE — Patient Instructions (Signed)

## 2021-08-25 NOTE — Assessment & Plan Note (Addendum)
pt still having weakness, has filed for disability and he is seeing Dr. [place] who did surgery on him in [place] and needs to do a cold stress test on him related to the disability physical. pt GF states they need me to say it is safe for him to have study. pt has no contraindications that I see to having this done.

## 2021-08-25 NOTE — Assessment & Plan Note (Signed)
still not seen by Cardiology, per referral note they have tried to reach pt several times. Phone number to their office provided today and pt & GF present encouraged to call their office.

## 2021-09-09 NOTE — Progress Notes (Signed)
Cardiology Office Note:    Date:  09/11/2021   ID:  Ernest [name], DOB [birth year], MRN [id number]  PCP:  Dulce SellarHudnell, Stephanie, NP   [facility] Providers Cardiologist:  None     Referring MD: Dulce SellarHudnell, Stephanie, NP   CC: TIA Consulted for the evaluation of  PFO at the behest of Dulce SellarHudnell, Stephanie, NP  History of Present Illness:    Ernest [name] is a 36 y.o. male with a hx of Morbid Obesity and recent TIA.    Patient notes that he has note fully recovered his 10/23 CVA.  Has had no chest pain, chest pressure, chest tightness, chest stinging.  Discomfort occurs as dizziness, without syncope.  He notes that his also feels the room shaking.  This has improved with meclizine and eating food.  No shortness of breath, DOE .  No PND or orthopnea.  No weight gain, leg swelling , or abdominal swelling.    He has no issues of plavix.  He has completely stopped smoking.  His wife is worried about the Bubble study.  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HCC)    Left-sided weakness 06/17/2021    Past Surgical History:  Procedure Laterality Date   BUBBLE STUDY  05/28/2021   Procedure: BUBBLE STUDY;  Surgeon: Chilton Siandolph, Tiffany, MD;  [facility];  Service: Cardiovascular;;   DECOMPRESSION FASCIOTOMY FOREARM Right    TEE WITHOUT CARDIOVERSION N/A 05/28/2021   Procedure: TRANSESOPHAGEAL ECHOCARDIOGRAM (TEE);  Surgeon: Chilton Siandolph, Tiffany, MD;  [facility];  Service: Cardiovascular;  Laterality: N/A;    Current Medications: Current Meds  Medication Sig   atorvastatin (LIPITOR) 80 MG tablet Take 1 tablet (80 mg total) by mouth daily.   clopidogrel (PLAVIX) 75 MG tablet Take 1 tablet (75 mg total) by mouth daily.   meclizine (ANTIVERT) 12.5 MG tablet Take 1 tablet (12.5 mg total) by mouth 3 (three) times daily as needed for dizziness.     Allergies:   Patient has no known allergies.   Social History   Socioeconomic History    Marital status: Significant Other    Spouse name: Not on file   Number of children: Not on file   Years of education: Not on file   Highest education level: Not on file  Occupational History   Not on file  Tobacco Use   Smoking status: Former    Packs/day: 0.01    Types: Cigarettes    Start date: 08/03/2005    Quit date: 05/03/2021    Years since quitting: 0.3   Smokeless tobacco: Never  Substance and Sexual Activity   Alcohol use: Never   Drug use: Never   Sexual activity: Not on file  Other Topics Concern   Not on file  Social History Narrative   Not on file   Social Determinants of Health   Financial Resource Strain: Not on file  Food Insecurity: Not on file  Transportation Needs: Not on file  Physical Activity: Not on file  Stress: Not on file  Social Connections: Not on file     Family History: The patient's family history is negative for Diabetes, Cancer, Heart failure, Hyperlipidemia, and Hypertension.  ROS:   Please see the history of present illness.     All other systems reviewed and are negative.  EKGs/Labs/Other Studies Reviewed:    The following studies were reviewed today:  EKG:   07/04/21: SR 68 Non specific TWI    Transesophageal Echocardiogram: Date: 05/28/22  Results: positive bubble study timing unclear by retrospective review  1. Left ventricular ejection fraction, by estimation, is 60 to 65%. The  left ventricle has normal function. The left ventricle has no regional  wall motion abnormalities.   2. Right ventricular systolic function is normal. The right ventricular  size is normal.   3. No left atrial/left atrial appendage thrombus was detected.   4. The mitral valve is normal in structure. Trivial mitral valve  regurgitation. No evidence of mitral stenosis.   5. The aortic valve is tricuspid. Aortic valve regurgitation is not  visualized. No aortic stenosis is present.   6. The inferior vena cava is normal in size with greater than 50%   respiratory variability, suggesting right atrial pressure of 3 mmHg.   7. Evidence of atrial level shunting detected by color flow Doppler.  Agitated saline contrast bubble study was positive with shunting observed  within 3-6 cardiac cycles suggestive of interatrial shunt. There is a  small patent foramen ovale with  predominantly right to left shunting across the atrial septum.    Recent Labs: 05/22/2021: TSH 0.687 07/03/2021: ALT 54; BUN 9; Creatinine, Ser 1.08; Hemoglobin 14.2; Platelets 289; Potassium 3.9; Sodium 137  Recent Lipid Panel    Component Value Date/Time   CHOL 116 06/16/2021 1420   TRIG 96.0 06/16/2021 1420   HDL 35.80 (L) 06/16/2021 1420   CHOLHDL 3 06/16/2021 1420   VLDL 19.2 06/16/2021 1420   LDLCALC 61 06/16/2021 1420    Physical Exam:    VS:  BP (!) 135/92    Pulse 67    Ht 5\' 3"  (1.6 m)    Wt 93.4 kg    SpO2 98%    BMI 36.49 kg/m     Wt Readings from Last 3 Encounters:  09/11/21 93.4 kg  08/25/21 96.6 kg  08/05/21 100.7 kg     Gen: no distress, Morbid Obesity   Neck: No JVD Cardiac: No Rubs or Gallops,  no murmur +2 radial pulses, RRR Respiratory: Clear to auscultation bilaterally, normal effort, normal  respiratory rate GI: Soft, nontender, non-distended  MS: No  edema;  moves all extremities Integument: He has a well healed scar on his R forearm from prior compartment syndrome Neuro:  At time of evaluation, alert and oriented to person/place/time/situation  Psych: Normal affect, patient feels better   ASSESSMENT:    1. History of TIA (transient ischemic attack)   2. PFO (patent foramen ovale)   3. OSA (obstructive sleep apnea)    PLAN:    PFO CVA OSA Former Smoker - patient likely has small PFO, had negative TCD; Dr. Pearlean Brownie, who has done some of the foundational research of PFO closure did not feel that this needed closing; I agree, discussed incident in the population - patient has done well with smoking cessation - on atorvastatin  and plavix - will place 14 day non live heart monitor; if negative will only need PRN follow up   For future visits, would benefit from a [place] interrpretor      Medication Adjustments/Labs and Tests Ordered: Current medicines are reviewed at length with the patient today.  Concerns regarding medicines are outlined above.  No orders of the defined types were placed in this encounter.  No orders of the defined types were placed in this encounter.   Patient Instructions  Medication Instructions:  Your physician recommends that you continue on your current medications as directed. Please refer to the Current Medication list given to you  today.  *If you need a refill on your cardiac medications before your next appointment, please call your pharmacy*   Lab Work: NONE If you have labs (blood work) drawn today and your tests are completely normal, you will receive your results only by: MyChart Message (if you have MyChart) OR A paper copy in the mail If you have any lab test that is abnormal or we need to change your treatment, we will call you to review the results.   Testing/Procedures: Your physician has requested that you wear a 14 day heart monitor.    Follow-Up: At [facility], you and your health needs are our priority.  As part of our continuing mission to provide you with exceptional heart care, we have created designated Provider Care Teams.  These Care Teams include your primary Cardiologist (physician) and Advanced Practice Providers (APPs -  Physician Assistants and Nurse Practitioners) who all work together to provide you with the care you need, when you need it.   Provider:   Riley Lam, MD {  Other Instructions  Christena Deem- Long Term Monitor Instructions  Your physician has requested you wear a ZIO patch monitor for 14 days.  This is a single patch monitor. [employer] supplies one patch monitor per enrollment. Additional stickers are not available.  Please do not apply patch if you will be having a Nuclear Stress Test,  Echocardiogram, Cardiac CT, MRI, or Chest Xray during the period you would be wearing the  monitor. The patch cannot be worn during these tests. You cannot remove and re-apply the  ZIO XT patch monitor.  Your ZIO patch monitor will be mailed 3 day USPS to your address on file. It may take 3-5 days  to receive your monitor after you have been enrolled.  Once you have received your monitor, please review the enclosed instructions. Your monitor  has already been registered assigning a specific monitor serial # to you.  Billing and Patient Assistance Program Information  We have supplied [employer] with any of your insurance information on file for billing purposes. [employer] offers a sliding scale Patient Assistance Program for patients that do not have  insurance, or whose insurance does not completely cover the cost of the ZIO monitor.  You must apply for the Patient Assistance Program to qualify for this discounted rate.  To apply, please call [employer] at [phone], select option 4, select option 2, ask to apply for  Patient Assistance Program. Meredeth Ide will ask your household income, and how many people  are in your household. They will quote your out-of-pocket cost based on that information.  [employer] will also be able to set up a 64-month, interest-free payment plan if needed.  Applying the monitor   Shave hair from upper left chest.  Hold abrader disc by orange tab. Rub abrader in 40 strokes over the upper left chest as  indicated in your monitor instructions.  Clean area with 4 enclosed alcohol pads. Let dry.  Apply patch as indicated in monitor instructions. Patch will be placed under collarbone on left  side of chest with arrow pointing upward.  Rub patch adhesive wings for 2 minutes. Remove white label marked "1". Remove the white  label marked "2". Rub patch adhesive wings for 2 additional minutes.  While  looking in a mirror, press and release button in center of patch. A small green light will  flash 3-4 times. This will be your only indicator that the monitor has been turned on.  Do not  shower for the first 24 hours. You may shower after the first 24 hours.  Press the button if you feel a symptom. You will hear a small click. Record Date, Time and  Symptom in the Patient Logbook.  When you are ready to remove the patch, follow instructions on the last 2 pages of Patient  Logbook. Stick patch monitor onto the last page of Patient Logbook.  Place Patient Logbook in the blue and white box. Use locking tab on box and tape box closed  securely. The blue and white box has prepaid postage on it. Please place it in the mailbox as  soon as possible. Your physician should have your test results approximately 7 days after the  monitor has been mailed back to [facility].  Call [facility] Customer Care at [phone] if you have questions regarding  your ZIO XT patch monitor. Call them immediately if you see an orange light blinking on your  monitor.  If your monitor falls off in less than 4 days, contact our Monitor department at [phone].  If your monitor becomes loose or falls off after 4 days call [employer] at [phone] for  suggestions on securing your monitor     Signed, Christell Constant, MD  09/11/2021 2:47 PM    [facility]

## 2021-09-11 ENCOUNTER — Other Ambulatory Visit: Payer: Self-pay

## 2021-09-11 ENCOUNTER — Encounter: Payer: Self-pay | Admitting: Internal Medicine

## 2021-09-11 ENCOUNTER — Ambulatory Visit: Payer: PRIVATE HEALTH INSURANCE | Admitting: Internal Medicine

## 2021-09-11 ENCOUNTER — Other Ambulatory Visit: Payer: Self-pay | Admitting: Internal Medicine

## 2021-09-11 ENCOUNTER — Ambulatory Visit (INDEPENDENT_AMBULATORY_CARE_PROVIDER_SITE_OTHER): Payer: PRIVATE HEALTH INSURANCE

## 2021-09-11 VITALS — BP 135/92 | HR 67 | Ht 63.0 in | Wt 206.0 lb

## 2021-09-11 DIAGNOSIS — Z8673 Personal history of transient ischemic attack (TIA), and cerebral infarction without residual deficits: Secondary | ICD-10-CM

## 2021-09-11 DIAGNOSIS — Q2112 Patent foramen ovale: Secondary | ICD-10-CM

## 2021-09-11 DIAGNOSIS — R42 Dizziness and giddiness: Secondary | ICD-10-CM

## 2021-09-11 DIAGNOSIS — I639 Cerebral infarction, unspecified: Secondary | ICD-10-CM

## 2021-09-11 DIAGNOSIS — G4733 Obstructive sleep apnea (adult) (pediatric): Secondary | ICD-10-CM | POA: Diagnosis not present

## 2021-09-11 NOTE — Progress Notes (Unsigned)
Enrolled for [employer] to mail a ZIO XT long term holter monitor to the patients address on file.  

## 2021-09-11 NOTE — Patient Instructions (Signed)
Medication Instructions:  Your physician recommends that you continue on your current medications as directed. Please refer to the Current Medication list given to you today.  *If you need a refill on your cardiac medications before your next appointment, please call your pharmacy*   Lab Work: NONE If you have labs (blood work) drawn today and your tests are completely normal, you will receive your results only by: MyChart Message (if you have MyChart) OR A paper copy in the mail If you have any lab test that is abnormal or we need to change your treatment, we will call you to review the results.   Testing/Procedures: Your physician has requested that you wear a 14 day heart monitor.    Follow-Up: At [facility], you and your health needs are our priority.  As part of our continuing mission to provide you with exceptional heart care, we have created designated Provider Care Teams.  These Care Teams include your primary Cardiologist (physician) and Advanced Practice Providers (APPs -  Physician Assistants and Nurse Practitioners) who all work together to provide you with the care you need, when you need it.   Provider:   Riley Lam, MD {  Other Instructions  Ernest [name]- Long Term Monitor Instructions  Your physician has requested you wear a ZIO patch monitor for 14 days.  This is a single patch monitor. [employer] supplies one patch monitor per enrollment. Additional stickers are not available. Please do not apply patch if you will be having a Nuclear Stress Test,  Echocardiogram, Cardiac CT, MRI, or Chest Xray during the period you would be wearing the  monitor. The patch cannot be worn during these tests. You cannot remove and re-apply the  ZIO XT patch monitor.  Your ZIO patch monitor will be mailed 3 day USPS to your address on file. It may take 3-5 days  to receive your monitor after you have been enrolled.  Once you have received your monitor, please review the  enclosed instructions. Your monitor  has already been registered assigning a specific monitor serial # to you.  Billing and Patient Assistance Program Information  We have supplied [employer] with any of your insurance information on file for billing purposes. [employer] offers a sliding scale Patient Assistance Program for patients that do not have  insurance, or whose insurance does not completely cover the cost of the ZIO monitor.  You must apply for the Patient Assistance Program to qualify for this discounted rate.  To apply, please call [employer] at [phone], select option 4, select option 2, ask to apply for  Patient Assistance Program. Ernest [name] will ask your household income, and how many people  are in your household. They will quote your out-of-pocket cost based on that information.  [employer] will also be able to set up a 46-month, interest-free payment plan if needed.  Applying the monitor   Shave hair from upper left chest.  Hold abrader disc by orange tab. Rub abrader in 40 strokes over the upper left chest as  indicated in your monitor instructions.  Clean area with 4 enclosed alcohol pads. Let dry.  Apply patch as indicated in monitor instructions. Patch will be placed under collarbone on left  side of chest with arrow pointing upward.  Rub patch adhesive wings for 2 minutes. Remove white label marked "1". Remove the white  label marked "2". Rub patch adhesive wings for 2 additional minutes.  While looking in a mirror, press and release button in center of patch. A  small green light will  flash 3-4 times. This will be your only indicator that the monitor has been turned on.  Do not shower for the first 24 hours. You may shower after the first 24 hours.  Press the button if you feel a symptom. You will hear a small click. Record Date, Time and  Symptom in the Patient Logbook.  When you are ready to remove the patch, follow instructions on the last 2 pages of Patient  Logbook.  Stick patch monitor onto the last page of Patient Logbook.  Place Patient Logbook in the blue and white box. Use locking tab on box and tape box closed  securely. The blue and white box has prepaid postage on it. Please place it in the mailbox as  soon as possible. Your physician should have your test results approximately 7 days after the  monitor has been mailed back to [facility].  Call [facility] Customer Care at [phone] if you have questions regarding  your ZIO XT patch monitor. Call them immediately if you see an orange light blinking on your  monitor.  If your monitor falls off in less than 4 days, contact our Monitor department at [phone].  If your monitor becomes loose or falls off after 4 days call [employer] at [phone] for  suggestions on securing your monitor

## 2021-09-14 DIAGNOSIS — Z8673 Personal history of transient ischemic attack (TIA), and cerebral infarction without residual deficits: Secondary | ICD-10-CM

## 2021-09-14 DIAGNOSIS — I639 Cerebral infarction, unspecified: Secondary | ICD-10-CM | POA: Diagnosis not present

## 2021-09-14 DIAGNOSIS — R42 Dizziness and giddiness: Secondary | ICD-10-CM

## 2021-10-08 ENCOUNTER — Other Ambulatory Visit: Payer: Self-pay | Admitting: Family

## 2021-10-08 DIAGNOSIS — Z8673 Personal history of transient ischemic attack (TIA), and cerebral infarction without residual deficits: Secondary | ICD-10-CM

## 2021-10-22 ENCOUNTER — Telehealth: Payer: Self-pay | Admitting: Family

## 2021-10-22 NOTE — Telephone Encounter (Signed)
Pt's wife stated pt has been having dizziness and fell. Currently being triaged. ?

## 2021-10-22 NOTE — Telephone Encounter (Signed)
Pt's spouse was advised to call EMS 911 ? ?Patient ?Name: ?Ernest [name] ?URUNG ?Gender: Male ?DOB: [birth year] ?Age: 36 Y 40 M 4 D ?Return ?Phone ?Number: ?[phone] ?(Primary), ?[phone] ?(Secondary) ?Address: ?City/ ?State/ ?Zip: [name]  ? [address] ?[facility] - ?Client ?Health visitor at [facility] ?Provider Tana Conch- MD ?Contact Type Call ?Who Is Calling Patient / Member / Family / Caregiver ?Call Type Triage / Clinical ?Caller Name Rob Bunting ?Relationship To Patient Spouse ?Return Phone Number [phone] (Primary) ?Chief Complaint WEAKNESS - sudden on one side of face or body ?Reason for Call Symptomatic / Request for Health Information ?Initial Comment Caller states her husband has been experiencing ?dizziness spells and has fallen due to the dizziness. ?He states that his left hand and leg feel weak. ?GOTO Facility Not Listed EMS to decide ?Translation No ?Nurse Assessment ?Nurse: Fayrene Fearing, RN, Judeth Cornfield Date/Time Lamount Cohen Time): 10/22/2021 10:41:15 AM ?Confirm and document reason for call. If ?symptomatic, describe symptoms. ?---Caller states her husband has been dizzy and has ?fallen down. He has a stroke in October. ?Does the patient have any new or worsening ?symptoms? ---Yes ?Will a triage be completed? ---Yes ?Related visit to physician within the last 2 weeks? ---No ?Does the PT have any chronic conditions? (i.e. ?diabetes, asthma, this includes High risk factors for ?pregnancy, etc.) ?---Yes ?List chronic conditions. ---stroke, small hole in heart, surgery in right hand ?Is this a behavioral health or substance abuse call? ---No ?Guidelines ?Guideline Title Affirmed Question Affirmed Notes Nurse Date/Time (Eastern ?Time) ?Neurologic Deficit [1] Weakness (i.e., ?paralysis, loss of ?muscle strength) of ?the face, arm / hand, ?or leg / foot on one ?side of the body AND ?Fayrene Fearing, RN, ?Judeth Cornfield ?10/22/2021  10:44:09 ?AM ?Guidelines ?Guideline Title Affirmed Question Affirmed Notes Nurse Date/Time (Eastern ?Time) ?[2] sudden onset ?AND [3] present now ?(Exception: Bell's ?palsy suspected [i.e., ?weakness only on ?one side of the face, ?developing over ?hours to days, no ?other symptoms]) ?Disp. Time (Eastern ?Time) Disposition Final User ?10/22/2021 10:40:02 AM Send to Urgent Queue Dennison Mascot ?10/22/2021 10:52:18 AM 911 Outcome Documentation Fayrene Fearing, RN, Judeth Cornfield ?Reason: Follow up. No VM to leave a ?message ?10/22/2021 10:46:22 AM Call EMS 911 Now Yes Fayrene Fearing, RN, Judeth Cornfield ?Caller Disagree/Comply Comply ?Caller Understands Yes ?PreDisposition Did not know what to do ?Care Advice Given Per Guideline ?CALL EMS 911 NOW: * Immediate medical attention is needed. You need to hang up and call 911 (or an ambulance). * Triager ?Discretion: I'll call you back in a few minutes to be sure you were able to reach them. CARE ADVICE given per Neurologic Deficit ?(Adult) guideline. ?Referrals ?GO TO FACILITY OTHER - SPECIFY ?

## 2021-11-05 ENCOUNTER — Encounter: Payer: Self-pay | Admitting: Family

## 2021-11-05 ENCOUNTER — Ambulatory Visit (INDEPENDENT_AMBULATORY_CARE_PROVIDER_SITE_OTHER): Payer: PRIVATE HEALTH INSURANCE | Admitting: Family

## 2021-11-05 VITALS — BP 116/82 | HR 70 | Temp 98.0°F | Ht 63.0 in | Wt 194.1 lb

## 2021-11-05 DIAGNOSIS — R531 Weakness: Secondary | ICD-10-CM

## 2021-11-05 DIAGNOSIS — E785 Hyperlipidemia, unspecified: Secondary | ICD-10-CM

## 2021-11-05 DIAGNOSIS — Z8673 Personal history of transient ischemic attack (TIA), and cerebral infarction without residual deficits: Secondary | ICD-10-CM

## 2021-11-05 LAB — COMPREHENSIVE METABOLIC PANEL
ALT: 29 U/L (ref 0–53)
AST: 40 U/L — ABNORMAL HIGH (ref 0–37)
Albumin: 4.2 g/dL (ref 3.5–5.2)
Alkaline Phosphatase: 97 U/L (ref 39–117)
BUN: 7 mg/dL (ref 6–23)
CO2: 28 mEq/L (ref 19–32)
Calcium: 9.3 mg/dL (ref 8.4–10.5)
Chloride: 104 mEq/L (ref 96–112)
Creatinine, Ser: 0.94 mg/dL (ref 0.40–1.50)
GFR: 105.55 mL/min (ref >60.00)
Glucose, Bld: 83 mg/dL (ref 70–99)
Potassium: 4 mEq/L (ref 3.5–5.1)
Sodium: 137 mEq/L (ref 135–145)
Total Bilirubin: 1 mg/dL (ref 0.2–1.2)
Total Protein: 7.3 g/dL (ref 6.0–8.3)

## 2021-11-05 LAB — LIPID PANEL
Cholesterol: 99 mg/dL (ref 0–200)
HDL: 37.1 mg/dL — ABNORMAL LOW (ref >39.00)
LDL Cholesterol: 44 mg/dL (ref 0–99)
NonHDL: 61.8
Total CHOL/HDL Ratio: 3
Triglycerides: 89 mg/dL (ref 0.0–149.0)
VLDL: 17.8 mg/dL (ref 0.0–40.0)

## 2021-11-05 MED ORDER — ATORVASTATIN CALCIUM 80 MG PO TABS: ORAL_TABLET | ORAL | 1 refills | Status: AC

## 2021-11-05 MED ORDER — CLOPIDOGREL BISULFATE 75 MG PO TABS: 75.0000 mg | ORAL_TABLET | Freq: Every day | ORAL | 0 refills | Status: DC

## 2021-11-05 NOTE — Assessment & Plan Note (Signed)
Chronic - no improvement in sx, pt still unable to work, needing FMLA forms signed again, but he does not have with him today. Showed his GF the form I completed last time, she will get a new blank one and drop off. ?

## 2021-11-05 NOTE — Patient Instructions (Addendum)
It was very nice to see you today! ? ?Go to the lab for blood work today. ? ?For continued FMLA, you need to have Frantz's place of employment send [place] new paperwork to complete. I have provided you with a copy today of the last one I completed. ? ?Keep your Neurology appointment on 4/11 and be sure to discuss his ongoing dizziness and the recent episode where he passed out at home and EMS called, and he has been evaluated by Cardiology and cleared. ? ? ?PLEASE NOTE: ? ?If you had any lab tests please let us know if you have not heard back within a few days. You may see your results on MyChart before we have a chance to review them but we will give you a call once they are reviewed by [place]. If we ordered any referrals today, please let us know if you have not heard from their office within the next week.  ? ?Please try these tips to maintain a healthy lifestyle: ? ?Eat most of your calories during the day when you are active. Eliminate processed foods including packaged sweets (pies, cakes, cookies), reduce intake of potatoes, white bread, white pasta, and white rice. Look for whole grain options, oat flour or almond flour. ? ?Each meal should contain half fruits/vegetables, one quarter protein, and one quarter carbs (no bigger than a computer mouse). ? ?Cut down on sweet beverages. This includes juice, soda, and sweet tea. Also watch fruit intake, though this is a healthier sweet option, it still contains natural sugar! Limit to 3 servings daily. ? ?Drink at least 1 glass of water with each meal and aim for at least 8 glasses per day ? ?Exercise at least 150 minutes every week.  ? ?

## 2021-11-05 NOTE — Progress Notes (Signed)
? ?Subjective:  ? ? ? Patient ID: Ernest [name], male    DOB: 26-Feb-1986, 36 y.o.   MRN: [id number] ? ?Chief Complaint  ?Patient presents with  ? FMLA  ? Dizziness  ?  Pt states Meclizine is not working. He is still having dizziness.   ? ?HPI: ?Dizziness: He describes it as feeling like room is spinning, occurs intermittently, and typically lasts a few minutes.  It typically occurs when he is lying back from sitting position, sitting up from lying position, sitting still, standing up from siting position, and walking. pt fell at home - called EMS - but checked out ok, so didn't go to ER. Has appt on 4/11 with Neurology again. Seen by Cardiology, all testing negative. Meclizine no longer helping. Denies any new meds. ?LEFT sided weakness:  CVA 6 months ago, still having residual weakness on his left side, mostly left hand. He also reports vision changes, has been seen by ophthalmology and no concerns found other than needing glasses.  Reports he has not had any physical therapy to date due to cost, his girlfriend is helping him at home with strengthening exercises. He has had a follow up appt with [facility] and will see again on 4/11. Seen by Cardiology for PFO, not found to be causing pt sx, repair not needed, f/u prn or annually. He also has some slurred speech that is resolving, no other new symptoms. He reports he is taking his daily Lipitor, and Plavix.  He has been unable to return to work. He is requesting FMLA paperwork completed again today. ? ?Health Maintenance Due  ?Topic Date Due  ? TETANUS/TDAP  Never done  ? COVID-19 Vaccine (3 - Booster for Pfizer series) 03/19/2020  ? ? ?Past Medical History:  ?Diagnosis Date  ? Compartment syndrome of upper arm ([place])   ? Left-sided weakness 06/17/2021  ? ? ?Past Surgical History:  ?Procedure Laterality Date  ? BUBBLE STUDY  05/28/2021  ? Procedure: BUBBLE STUDY;  Surgeon: Skeet Latch, MD;  Location: [place];  Service: Cardiovascular;;  ? DECOMPRESSION  FASCIOTOMY FOREARM Right   ? TEE WITHOUT CARDIOVERSION N/A 05/28/2021  ? Procedure: TRANSESOPHAGEAL ECHOCARDIOGRAM (TEE);  Surgeon: Skeet Latch, MD;  Location: [place];  Service: Cardiovascular;  Laterality: N/A;  ? ? ?Outpatient Medications Prior to Visit  ?Medication Sig Dispense Refill  ? meclizine (ANTIVERT) 12.5 MG tablet Take 1 tablet (12.5 mg total) by mouth 3 (three) times daily as needed for dizziness. 90 tablet 5  ? atorvastatin (LIPITOR) 80 MG tablet TAKE 1 TABLET(80 MG) BY MOUTH DAILY 30 tablet 3  ? clopidogrel (PLAVIX) 75 MG tablet Take 1 tablet (75 mg total) by mouth daily. 90 tablet 0  ? ?No facility-administered medications prior to visit.  ? ? ?No Known Allergies ? ? ?   ?Objective:  ?  ?Physical Exam ?Vitals and nursing note reviewed.  ?Constitutional:   ?   General: He is not in acute distress. ?   Appearance: Normal appearance. He is obese.  ?HENT:  ?   Head: Normocephalic.  ?Cardiovascular:  ?   Rate and Rhythm: Normal rate and regular rhythm.  ?Pulmonary:  ?   Effort: Pulmonary effort is normal.  ?   Breath sounds: Normal breath sounds.  ?Musculoskeletal:     ?   General: Normal range of motion.  ?   Cervical back: Normal range of motion.  ?Skin: ?   General: Skin is warm and dry.  ?Neurological:  ?   Mental Status:  He is alert and oriented to person, place, and time.  ?   Motor: Weakness (left arm, hand, leg) present.  ?   Gait: Gait abnormal.  ?Psychiatric:     ?   Mood and Affect: Mood normal.  ? ? ?BP 116/82 (BP Location: Right Arm, Patient Position: Sitting, Cuff Size: Normal)   Pulse 70   Temp 98 ?F (36.7 ?C) (Temporal)   Ht '5\' 3"'  (1.6 m)   Wt 194 lb 2 oz (88.1 kg)   SpO2 97%   BMI 34.39 kg/m?  ?Wt Readings from Last 3 Encounters:  ?11/05/21 194 lb 2 oz (88.1 kg)  ?09/11/21 206 lb (93.4 kg)  ?08/25/21 213 lb (96.6 kg)  ? ? ?   ?Assessment & Plan:  ? ?Problem List Items Addressed This Visit   ? ?  ? Nervous and Auditory  ? Left-sided weakness  ?  Chronic - no improvement  in sx, pt still unable to work, needing FMLA forms signed again, but he does not have with him today. Showed his GF the form I completed last time, she will get a new blank one and drop off. ?  ?  ?  ? Other  ? Hyperlipidemia  ? Relevant Medications  ? atorvastatin (LIPITOR) 80 MG tablet  ? Other Relevant Orders  ? Lipid panel (Completed)  ? Comp Met (CMET) (Completed)  ? Status post CVA - Primary  ? Relevant Medications  ? clopidogrel (PLAVIX) 75 MG tablet  ? ? ?Meds ordered this encounter  ?Medications  ? clopidogrel (PLAVIX) 75 MG tablet  ?  Sig: Take 1 tablet (75 mg total) by mouth daily.  ?  Dispense:  90 tablet  ?  Refill:  0  ?  Order Specific Question:   Supervising Provider  ?  Answer:   ANDY, CAMILLE L [[id number]]  ? atorvastatin (LIPITOR) 80 MG tablet  ?  Sig: TAKE 1 TABLET(80 MG) BY MOUTH DAILY  ?  Dispense:  90 tablet  ?  Refill:  1  ?  Order Specific Question:   Supervising Provider  ?  Answer:   ANDY, CAMILLE L [[id number]]  ? ? ?Jeanie Sewer, NP ? ?

## 2021-11-06 ENCOUNTER — Telehealth: Payer: Self-pay

## 2021-11-06 NOTE — Telephone Encounter (Signed)
States employer is refusing to accept FMLA forms but now is requesting a letter stating why patient is not able to work.

## 2021-11-09 ENCOUNTER — Encounter: Payer: Self-pay | Admitting: Family

## 2021-11-09 NOTE — Telephone Encounter (Signed)
Letter sent via MyChart - ok to print if needed.

## 2021-11-09 NOTE — Progress Notes (Signed)
Hi Ernest [name], ? ?Your cholesterol numbers all look good, just your good # (HDL) is a little low, this can go up with eating more fish or taking a fish oil supplement and exercise. ?Continue taking the same dose of Lipitor every day - a refill was sent to your pharmacy. ?Your glucose, electrolytes, liver & kidney function are all in normal range. ? ?I also attached a letter for your work to [employer]. ?Take care.

## 2021-11-10 ENCOUNTER — Other Ambulatory Visit: Payer: Self-pay | Admitting: Family

## 2021-11-10 DIAGNOSIS — Z8673 Personal history of transient ischemic attack (TIA), and cerebral infarction without residual deficits: Secondary | ICD-10-CM

## 2021-11-11 ENCOUNTER — Ambulatory Visit (INDEPENDENT_AMBULATORY_CARE_PROVIDER_SITE_OTHER): Payer: PRIVATE HEALTH INSURANCE | Admitting: Adult Health

## 2021-11-11 ENCOUNTER — Encounter: Payer: Self-pay | Admitting: Adult Health

## 2021-11-11 VITALS — BP 116/82 | HR 60 | Ht 63.0 in | Wt 197.0 lb

## 2021-11-11 DIAGNOSIS — R42 Dizziness and giddiness: Secondary | ICD-10-CM

## 2021-11-11 DIAGNOSIS — I69398 Other sequelae of cerebral infarction: Secondary | ICD-10-CM

## 2021-11-11 DIAGNOSIS — I639 Cerebral infarction, unspecified: Secondary | ICD-10-CM

## 2021-11-11 MED ORDER — MECLIZINE HCL 12.5 MG PO TABS: 12.5000 mg | ORAL_TABLET | Freq: Three times a day (TID) | ORAL | 5 refills | Status: DC | PRN

## 2021-11-11 NOTE — Progress Notes (Signed)
?[facility] ?[address] ?[place]. [place] [address] ?[phone] ? ?     STROKE FOLLOW UP NOTE ? ?[name] ?Date of Birth:  1985/11/02 ?Medical Record Number:  [id number]  ? ?Reason for Referral: stroke follow up ? ? ? ?SUBJECTIVE: ? ? ?CHIEF COMPLAINT:  ?Chief Complaint  ?Patient presents with  ? Follow-up  ?  Rm 3 with girlfriend (helping with interpretation). Reports lingering left hand/leg weakness. 1 fall back in March (in the restroom) landed on his side.  ? ? ?HPI:  ? ?Update 11/11/2021 JM: 36 year old male with right BG/CR infarct in 05/2021, returns for 68-month follow-up visit.  He is accompanied by his girlfriend who assists with interpretation.  Reports persistent symptoms as discussed at prior visit including intermittent dizziness (only with ambulation), gait impairment, left hand weakness and dysarthria.  GF also mentions some difficulty with short-term memory loss.  He did have a fall back in March due to losing his balance, was evaluated by EMS and as stable, did not proceed to ED.  He has not had any additional falls since that time. Will use cane for long distance. Reports no benefit with use of meclizine currently using 12.5 mg tablet daily (rx'd TID), denies any side effects - he questions increasing dosage.  Still has not been able to participate in any therapies as he remains uninsured, per GF, was denied by Medicaid. GF continues to work with him on exercises. 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  Denies new stroke/TIA symptoms.  Compliant on Plavix and atorvastatin, denies side effects.  Blood pressure today 116/82.  He has since been seen by PCP.  He was evaluated by cardiology who agreed with Dr. Leonie Man regarding no indication for PFO closure.  Completed cardiac monitor which was negative for atrial fibrillation.  No further concerns at this time. ? ? ? ? ? ?History provided for reference purposes  only ?Initial visit 07/10/2021 JM: Being seen for initial hospital follow-up accompanied by his girlfriend who assists with interpretation.  Interpreter waiver signed. ? ?C/o blurred vision - seen by eye doctor and currently waiting for rx glasses  ?C/o dizziness after walking for a few minutes.describes this as room spinning sensation.  Will feel off balance at times ?Left arm weakness - some improvement but does feel increased tightness at night ?Occasional slurring  ?No new stroke/TIA symptoms ? ?He has not participated in any type of therapies.  He is currently not working -previously working as a Freight forwarder.  Requesting FMLA paperwork to be completed.  He is currently applying for disability and Medicaid. He and g/f are concerned regarding cost of doing therapy at this point.  ? ?Compliant on Plavix and atorvastatin 80 mg daily -denies side effects ?Blood pressure today 140/91 -does not routinely monitor at home ?Appointment pulmonology 08/05/2021 for CPAP ? ?No further concerns at this time ? ? ?Stroke admission 05/22/2021 ?[name] is a 36 y.o. 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  He presented to the ED on 05/22/2021 after awakening around 0130 with left sided weakness, left sided facial droop and dysphasia.  Symptoms resolved spontaneously, but patient and significant other state that they come and go.  Personally reviewed hospitalization pertinent progress notes, lab work and imaging.  Evaluated by Dr. 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  CTA performed reveals narrowing of the right distal M1 segment of the MCA.  EEG performed was negative for epileptiform discharges.  EF 60 to 65%.  LDL 120.  A1c 5.5.  Recommended DAPT for 3 months due to intracranial stenosis and aspirin alone as well as initiated atorvastatin 80  mg daily.  Smoking cessation counseling provided.  PT/OT no therapy needs and discharged home on 05/23/2021. ? ?He returned to ED on 05/24/2021 for worsening confusion, slurred speech, left facial weakness and left hand numbness.  Repeat MRI no significant change from recent imaging or evidence of new stroke.  Evaluated by Dr. Leonie Man who felt recent infarct likely cryptogenic given absence of significant arthrosclerosis elsewhere.  LE Doppler negative for DVT.  TCD bubble negative for PFO.  TEE small PFO, no evidence of thrombus.  ANA and anticardiolipin antibodies negative. Known OSA dx'd over 1 year ago at [facility] but never started CPAP - he was advised to follow back up with them to obtain CPAP.  Recommended continuation of DAPT and statin and discharged home on 05/28/2021 ? ? ? ? ?PERTINENT IMAGING ? ?Per hospitalization 05/22/2021 ?Code Stroke CT head No acute abnormality. ASPECTS 10.  ?CTA head & neck narrowing and irregularity of the right distal M1 segment of the MCA ?MRI brain done shows 3.4 cm acute infarct within the right basal ganglia and right corona radiata. ?MRA head shows moderate stenosis within the distal M1 right middle cerebral artery. No M2 proximal branch occlusion or high-grade proximal stenosis is identified ?2D Echo EF 60 to 65% ? ?Per hospitalization 05/24/2021 ?CTH subacute appearing right basal ganglia and corona radiata infarction ?MR Brain right BG infarct with no significant regional extension, no new intracranial abnormality ?2D Echo EF 60-65%. No atrial shunt ?TCD no evidence of PFO ?ANA and anticardiolipin antibodies negative ?TEE EF 60 to 65%, no evidence of thrombus, evidence of small PFO ?lower extremities negative for DVT  ?EEG no seizure activity UDS negative  ? ? ? ? ?ROS:   ?14 system review of systems performed and negative with exception of those listed in HPI ? ?PMH:  ?Past Medical History:  ?Diagnosis Date  ? Compartment syndrome of upper arm ([place])   ? Left-sided weakness  06/17/2021  ? ? ?PSH:  ?Past Surgical History:  ?Procedure Laterality Date  ? BUBBLE STUDY  05/28/2021  ? Procedure: BUBBLE STUDY;  Surgeon: Skeet Latch, MD;  Location: [facility];  Service: Cardiovascular;;  ? DECOMPRESSION FASCIOTOMY FOREARM Right   ? TEE WITHOUT CARDIOVERSION N/A 05/28/2021  ? Procedure: TRANSESOPHAGEAL ECHOCARDIOGRAM (TEE);  Surgeon: Skeet Latch, MD;  Location: [place];  Service: Cardiovascular;  Laterality: N/A;  ? ? ?Social History:  ?Social History  ? ?Socioeconomic History  ? Marital status: Significant Other  ?  Spouse name: Not on file  ? Number of children: Not on file  ? Years of education: Not on file  ? Highest education level: Not on file  ?Occupational History  ? Not on file  ?Tobacco Use  ? Smoking status: Former  ?  Packs/day: 0.01  ?  Types: Cigarettes  ?  Start date: 08/03/2005  ?  Quit date: 05/03/2021  ?  Years since quitting: 0.5  ? Smokeless tobacco: Never  ?Substance and Sexual Activity  ? Alcohol use: Never  ? Drug use: Never  ? Sexual activity: Not on file  ?Other Topics Concern  ? Not on file  ?Social History Narrative  ?  Not on file  ? ?Social Determinants of Health  ? ?Financial Resource Strain: Not on file  ?Food Insecurity: Not on file  ?Transportation Needs: Not on file  ?Physical Activity: Not on file  ?Stress: Not on file  ?Social Connections: Not on file  ?Intimate Partner Violence: Not on file  ? ? ?Family History:  ?Family History  ?Problem Relation Age of Onset  ? Diabetes Neg Hx   ? Cancer Neg Hx   ? Heart failure Neg Hx   ? Hyperlipidemia Neg Hx   ? Hypertension Neg Hx   ? ? ?Medications:   ?Current Outpatient Medications on File Prior to Visit  ?Medication Sig Dispense Refill  ? atorvastatin (LIPITOR) 80 MG tablet TAKE 1 TABLET(80 MG) BY MOUTH DAILY 90 tablet 1  ? clopidogrel (PLAVIX) 75 MG tablet TAKE 1 TABLET(75 MG) BY MOUTH DAILY 90 tablet 0  ? meclizine (ANTIVERT) 12.5 MG tablet Take 1 tablet (12.5 mg total) by mouth 3 (three) times daily  as needed for dizziness. 90 tablet 5  ? ?No current facility-administered medications on file prior to visit.  ? ? ?Allergies:  No Known Allergies ? ? ? ?OBJECTIVE: ? ?Physical Exam ? ?Vitals:  ? 04/11/2

## 2021-11-11 NOTE — Patient Instructions (Addendum)
Continue use of Meclizine at 12.5mg  three times daily (can try just twice a day for a few days then increase to 3 times daily if needed) - the best way to get your feeling better is to get you in to doing therapies - please ensure you complete paperwork for [facility] Financial assistance - if this gets approved, please let me know and I will place orders for therapies - if unable to obtain [facility] assistance, would recommend looking in to one of the local colleges such as [employer] and [employer] who offer physical therapy programs as you may be able to work with the students to get therapy  ? ?Continue clopidogrel 75 mg daily  and atorvastatin  for secondary stroke prevention ? ?Continue to follow up with PCP regarding cholesterol management  ?Maintain strict control of cholesterol with LDL cholesterol (bad cholesterol) goal below 70 mg/dL.  ? ?Signs of a Stroke? Follow the BEFAST method:  ?Balance Watch for a sudden loss of balance, trouble with coordination or vertigo ?Eyes Is there a sudden loss of vision in one or both eyes? Or double vision?  ?Face: Ask the person to smile. Does one side of the face droop or is it numb?  ?Arms: Ask the person to raise both arms. Does one arm drift downward? Is there weakness or numbness of a leg? ?Speech: Ask the person to repeat a simple phrase. Does the speech sound slurred/strange? Is the person confused ? ?Time: If you observe any of these signs, call 911. ? ? ? ? ?Followup in the future with me in 6 months or call earlier if needed ? ? ? ? ? ? ?Thank you for coming to see [place] at [facility]. I hope we have been able to provide you high quality care today. ? ?You may receive a patient satisfaction survey over the next few weeks. We would appreciate your feedback and comments so that we may continue to improve ourselves and the health of our patients. ? ? ?

## 2021-12-03 NOTE — Progress Notes (Signed)
08/05/21- 35 yoM Smoker  from [place], for sleep evaluation courtesy of Dulce Sellar, NP with concern of OSA Medical problem list includes CVA, Hx Seizure, Patent Foramen Ovale, OSA, Obesity, Hypocalcemia, Hyperlipidemia,  Epworth score-3 Body weight today- Covid vax-2 Phizer Flu vax-had ------Patient reports he has been snoring worse in last year.  Here with wife/girlfriend.  They report he snores loudly.  Unemployed.  No ENT surgery.  History of patent foramen ovale and surgical repair was recommended in the past but he did not do it.  He had a sleep study last year in [place].  We are requesting results.  He was told he should have CPAP but he chose to defer and then to come here to get CPAP initiated. As I finished discussion of OSA, medical concerns and treatment options, his girlfriend/wife indicated an interpreter would be helpful, having not raised the issue earlier.  I gave him an [place] patient handout on sleep apnea which she indicated they could read.  We will work to have interpreter here in the future.  12/04/21- 35 yoM Smoker followed for OSA, complicated by CVA, Hx Seizure, Patent Foramen Ovale, OSA, Obesity, Hypocalcemia, Hyperlipidemia,  [facility] 11/09/19   AHI 18.9/ hr, desaturation to 77%,  Body weight today-190 lbs Comes with a translator today.  Wanted explanation of sleep study results and treatment choices. We are going to start CPAP. He denies new neurologic problems although I see he has been evaluated for dizziness.  ROS-see HPI  + = positive Constitutional:    weight loss, night sweats, fevers, chills, fatigue, lassitude. HEENT:    headaches, difficulty swallowing, tooth/dental problems, sore throat,       sneezing, itching, ear ache, nasal congestion, post nasal drip, snoring CV:    chest pain, orthopnea, PND, swelling in lower extremities, anasarca,                                   dizziness, palpitations Resp:   +shortness of breath with exertion or  at rest.                productive cough,   non-productive cough, coughing up of blood.              change in color of mucus.  wheezing.   Skin:    rash or lesions. GI:  No-   heartburn, indigestion, abdominal pain, nausea, vomiting, diarrhea,                 change in bowel habits, loss of appetite GU: dysuria, change in color of urine, no urgency or frequency.   flank pain. MS:   joint pain, stiffness, decreased range of motion, back pain. Neuro-    L hand numb on waking in AM-? Impingement, + dizziness Psych:  change in mood or affect.  depression or anxiety.   memory loss.  OBJ- Physical Exam General- Alert, Oriented, Affect-appropriate, Distress- none acute, + obese Skin- rash-none, lesions- none, excoriation- none Lymphadenopathy- none Head- atraumatic            Eyes- Gross vision intact, PERRLA, conjunctivae and secretions clear            Ears- Hearing, canals-normal            Nose- Clear, no-Septal dev, mucus, polyps, erosion, perforation             Throat- Mallampati III-IV , mucosa clear ,  drainage- none, tonsils+, teeth+ Neck- flexible , trachea midline, no stridor , thyroid nl, carotid no bruit Chest - symmetrical excursion , unlabored           Heart/CV- RRR , +no murmur heard, no gallop  , no rub, nl s1 s2                           - JVD- none , edema- none, stasis changes- none, varices- none           Lung- + trace rattle right base unlabored, wheeze- none, cough- none , dullness-none, rub- none           Chest wall-  Abd-  Br/ Gen/ Rectal- Not done, not indicated Extrem- cyanosis- none, clubbing, none, atrophy- none, strength- nl Neuro- grossly intact to observation

## 2021-12-04 ENCOUNTER — Ambulatory Visit: Payer: Self-pay | Admitting: Internal Medicine

## 2021-12-04 ENCOUNTER — Ambulatory Visit: Payer: PRIVATE HEALTH INSURANCE | Admitting: Internal Medicine

## 2021-12-04 ENCOUNTER — Encounter: Payer: Self-pay | Admitting: Internal Medicine

## 2021-12-04 ENCOUNTER — Other Ambulatory Visit: Payer: Self-pay | Admitting: Internal Medicine

## 2021-12-04 VITALS — BP 110/81 | HR 62 | Temp 98.0°F | Ht 63.0 in | Wt 190.6 lb

## 2021-12-04 DIAGNOSIS — G4733 Obstructive sleep apnea (adult) (pediatric): Secondary | ICD-10-CM

## 2021-12-04 DIAGNOSIS — I63511 Cerebral infarction due to unspecified occlusion or stenosis of right middle cerebral artery: Secondary | ICD-10-CM

## 2021-12-04 NOTE — Patient Instructions (Addendum)
Order- new DME, new CPAP auto 5-15, mask of choice, humidifier, supplies, AirView/ card. Patient has limited English and will need language help(N[place]alese) communicating with DME company. ? ?Please call if we can help ? ? ?

## 2021-12-19 ENCOUNTER — Encounter: Payer: Self-pay | Admitting: Family

## 2021-12-19 ENCOUNTER — Ambulatory Visit (INDEPENDENT_AMBULATORY_CARE_PROVIDER_SITE_OTHER): Payer: Self-pay | Admitting: Family

## 2021-12-19 VITALS — BP 111/69 | HR 67 | Temp 98.3°F | Ht 63.0 in | Wt 190.2 lb

## 2021-12-19 DIAGNOSIS — R531 Weakness: Secondary | ICD-10-CM

## 2021-12-19 DIAGNOSIS — R42 Dizziness and giddiness: Secondary | ICD-10-CM

## 2021-12-19 DIAGNOSIS — Z8673 Personal history of transient ischemic attack (TIA), and cerebral infarction without residual deficits: Secondary | ICD-10-CM

## 2021-12-19 DIAGNOSIS — N399 Disorder of urinary system, unspecified: Secondary | ICD-10-CM

## 2021-12-19 LAB — POCT URINALYSIS DIPSTICK
Bilirubin, UA: NEGATIVE
Blood, UA: NEGATIVE
Glucose, UA: NEGATIVE
Ketones, UA: NEGATIVE
Leukocytes, UA: NEGATIVE
Nitrite, UA: NEGATIVE
Protein, UA: NEGATIVE
Spec Grav, UA: 1.02 (ref 1.010–1.025)
Urobilinogen, UA: 0.2 E.U./dL
pH, UA: 6.5 (ref 5.0–8.0)

## 2021-12-19 MED ORDER — OXYBUTYNIN CHLORIDE ER 5 MG PO TB24: 5.0000 mg | ORAL_TABLET | Freq: Every day | ORAL | 0 refills | Status: DC

## 2021-12-19 NOTE — Patient Instructions (Addendum)
It was very nice to see you today!  For the dizziness, you can try and take 2 pills at a time of the Meclizine, however, this may cause drowsiness, he needs to be careful to avoid falling.   For his urinary symptoms, I am sending Oxybutynin, extended release to take at bedtime.  Side effects can include dry mouth and dizziness. So I prefer he NOT start this until he sees if the extra Meclizine helps his dizziness first, if it does, then start the Oxybutynin, but if dizziness returns, he needs to let me know.  MUST DRINK AT LEAST  2 liters of water every day! Do not go long periods without eating - try to eat mini meals every 2 hours.  For his disability, you need to contact the DSS office again and ask how you get in touch with a lawyer who handles permanent disability. We only take care of short term disability forms here.    PLEASE NOTE:  If you had any lab tests please let us know if you have not heard back within a few days. You may see your results on MyChart before we have a chance to review them but we will give you a call once they are reviewed by [place]. If we ordered any referrals today, please let us know if you have not heard from their office within the next week.

## 2021-12-19 NOTE — Assessment & Plan Note (Signed)
Chronic - pt here with permanent disability forms, states he was denied by medicaid and let go from his job. Advised pt and GF that I do not do permanent disability, he needs to find a disability lawyer. I will refer to [facility] to see if they can guide them to the right source.

## 2021-12-19 NOTE — Assessment & Plan Note (Signed)
Chronic - since CVA last year, pt having daily dizziness, mainly when up ambulating. Seen by Neuro and told to continue the Meclizine. Advised to increase dose to 2 pills tid, but may cause drowsiness, will need to be extra careful to avoid falls.

## 2021-12-19 NOTE — Progress Notes (Signed)
Subjective:     Patient ID: Ernest [name], male    DOB: 1986/03/29, 36 y.o.   MRN: [id number]  Chief Complaint  Patient presents with   Urinary Incontinence    Pt is not able to control when he needs to urinate for about a week.     HPI: Dizziness: He describes it as feeling like room is spinning, occurs intermittently, and typically lasts a few minutes.  It typically occurs when he is lying back from sitting position, sitting up from lying position, sitting still, standing up from siting position, and walking. pt has had falls at home. Advised last visit to discuss with NEURO, seen in April. Has been also seen by Cardiology, all testing negative. Meclizine no longer helping, cg is asking if dose can be increased. Denies any new meds. Urinary symptoms: Patient c/o  frequency, urgency & incontinence. Other sx: denies hematuria, pelvic or back pain. Duration of sx: for about a week, also having diarrhea; Denies  nausea, fever. Reports last UTI mild during hospital stay last October.    Assessment & Plan:   Problem List Items Addressed This Visit       Nervous and Auditory   Left-sided weakness    Chronic - pt here with permanent disability forms, states he was denied by medicaid and let go from his job. Advised pt and GF that I do not do permanent disability, he needs to find a disability lawyer. I will refer to [facility] to see if they can guide them to the right source.       Relevant Orders   AMB Referral to [facility]     Other   Status post CVA   Relevant Orders   AMB Referral to [facility]   Dizziness    Chronic - since CVA last year, pt having daily dizziness, mainly when up ambulating. Seen by Neuro and told to continue the Meclizine. Advised to increase dose to 2 pills tid, but may cause drowsiness, will need to be extra careful to avoid falls.       Relevant Orders   AMB Referral to [facility]   Other Visit Diagnoses     Urinary problem in  male    -  Primary   Sending Oxybutynin to take qhs, advised on SE of dizziness, needs to see if increased Meclizine improves dizziness before starting as do not want to worsen sx. f/u prn  Relevant Medications   oxybutynin (DITROPAN-XL) 5 MG 24 hr tablet   Other Relevant Orders   POCT Urinalysis Dipstick (Completed)      Outpatient Medications Prior to Visit  Medication Sig Dispense Refill   atorvastatin (LIPITOR) 80 MG tablet TAKE 1 TABLET(80 MG) BY MOUTH DAILY 90 tablet 1   clopidogrel (PLAVIX) 75 MG tablet TAKE 1 TABLET(75 MG) BY MOUTH DAILY 90 tablet 0   meclizine (ANTIVERT) 12.5 MG tablet Take 1 tablet (12.5 mg total) by mouth 3 (three) times daily as needed for dizziness. 90 tablet 5   No facility-administered medications prior to visit.    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  Constitutional:      General: He is not in acute distress.    Appearance: Normal appearance. He is obese.  HENT:     Head: Normocephalic.  Cardiovascular:     Rate and Rhythm: Normal rate and regular rhythm.  Pulmonary:     Effort: Pulmonary effort is normal.     Breath sounds: Normal breath sounds.  Musculoskeletal:        General: Normal range of motion.     Cervical back: Normal range of motion.  Skin:    General: Skin is warm and dry.  Neurological:     Mental Status: He is alert and oriented  to person, place, and time.     Motor: Weakness present.     Coordination: Coordination abnormal.     Gait: Gait abnormal.  Psychiatric:        Mood and Affect: Mood normal.    BP 111/69 (BP Location: Left Arm, Patient Position: Sitting, Cuff Size: Large)   Pulse 67   Temp 98.3 F (36.8 C) (Temporal)   Ht 5\' 3"  (1.6 m)   Wt 190 lb 4 oz (86.3 kg)   SpO2 99%   BMI 33.70 kg/m  Wt Readings from Last 3 Encounters:  12/19/21 190 lb 4 oz (86.3 kg)  12/04/21 190 lb 9.6 oz (86.5 kg)  11/11/21 197 lb (89.4 kg)       Meds ordered this encounter  Medications   oxybutynin (DITROPAN-XL) 5 MG 24 hr tablet    Sig: Take 1 tablet (5 mg total) by mouth at bedtime.    Dispense:  30 tablet    Refill:  0    Order Specific Question:   Supervising Provider    Answer:   ANDY, CAMILLE L [id number]    Jeanie Sewer, NP

## 2022-01-02 ENCOUNTER — Encounter: Payer: Self-pay | Admitting: Internal Medicine

## 2022-01-02 NOTE — Assessment & Plan Note (Signed)
No new events.  He continues to follow with neurology.

## 2022-01-02 NOTE — Assessment & Plan Note (Signed)
Moderately severe obstructive sleep apnea based on study now available from [facility] in 2021. Plan-treatment options discussed.  Start CPAP auto 5-15

## 2022-01-02 NOTE — Assessment & Plan Note (Signed)
Encourage continued efforts at diet and exercise with a goal of normal body weight.

## 2022-02-11 ENCOUNTER — Other Ambulatory Visit: Payer: Self-pay | Admitting: Family

## 2022-02-11 DIAGNOSIS — Z8673 Personal history of transient ischemic attack (TIA), and cerebral infarction without residual deficits: Secondary | ICD-10-CM

## 2022-03-08 NOTE — Progress Notes (Signed)
HPI M Smoker, from [place],  followed for OSA, complicated by CVA, Hx Seizure, Patent Foramen Ovale, OSA, Obesity, Hypocalcemia, Hyperlipidemia,  [facility] 11/09/19   AHI 18.9/ hr, desaturation to 77%,  ======================================================================  12/04/21- 35 yoM Smoker followed for OSA, complicated by CVA, Hx Seizure, Patent Foramen Ovale, OSA, Obesity, Hypocalcemia, Hyperlipidemia,  [facility]PS[facility] 11/09/19   AHI 18.9/ hr, desaturation to 77%,  Body weight today-190 lbs Comes with a translator today.  Wanted explanation of sleep study results and treatment choices. We are going to start CPAP. He denies new neurologic problems although I see he has been evaluated for dizziness.  03/10/22- 36 yoM Smoker followed for OSA, complicated by CVA, Hx Seizure, Patent Foramen Ovale, OSA, Obesity, Hypocalcemia, Hyperlipidemia,  CPAP auto 5-15/ [employer] Download compliance- Body weight today- Covid vax- -----Pt here for OSA but still hasn't received a CPAP machine. Getting approx 6-7hrs/night, does snore sometimes.  I sent him home, no charge for visit. Return after he gets CPAP    ROS-see HPI  + = positive Constitutional:    weight loss, night sweats, fevers, chills, fatigue, lassitude. HEENT:    headaches, difficulty swallowing, tooth/dental problems, sore throat,       sneezing, itching, ear ache, nasal congestion, post nasal drip, snoring CV:    chest pain, orthopnea, PND, swelling in lower extremities, anasarca,                                   dizziness, palpitations Resp:   +shortness of breath with exertion or at rest.                productive cough,   non-productive cough, coughing up of blood.              change in color of mucus.  wheezing.   Skin:    rash or lesions. GI:  No-   heartburn, indigestion, abdominal pain, nausea, vomiting, diarrhea,                 change in bowel habits, loss of appetite GU: dysuria, change in color of urine, no urgency or  frequency.   flank pain. MS:   joint pain, stiffness, decreased range of motion, back pain. Neuro-    L hand numb on waking in AM-? Impingement, + dizziness Psych:  change in mood or affect.  depression or anxiety.   memory loss.  OBJ- Physical Exam General- Alert, Oriented, Affect-appropriate, Distress- none acute, + obese Skin- rash-none, lesions- none, excoriation- none Lymphadenopathy- none Head- atraumatic            Eyes- Gross vision intact, PERRLA, conjunctivae and secretions clear            Ears- Hearing, canals-normal            Nose- Clear, no-Septal dev, mucus, polyps, erosion, perforation             Throat- Mallampati III-IV , mucosa clear , drainage- none, tonsils+, teeth+ Neck- flexible , trachea midline, no stridor , thyroid nl, carotid no bruit Chest - symmetrical excursion , unlabored           Heart/CV- RRR , +no murmur heard, no gallop  , no rub, nl s1 s2                           - JVD- none , edema-  none, stasis changes- none, varices- none           Lung- + trace rattle right base unlabored, wheeze- none, cough- none , dullness-none, rub- none           Chest wall-  Abd-  Br/ Gen/ Rectal- Not done, not indicated Extrem- cyanosis- none, clubbing, none, atrophy- none, strength- nl Neuro- grossly intact to observation

## 2022-03-10 ENCOUNTER — Telehealth: Payer: Self-pay

## 2022-03-10 ENCOUNTER — Encounter: Payer: Self-pay | Admitting: Internal Medicine

## 2022-03-10 ENCOUNTER — Ambulatory Visit (INDEPENDENT_AMBULATORY_CARE_PROVIDER_SITE_OTHER): Payer: PRIVATE HEALTH INSURANCE | Admitting: Internal Medicine

## 2022-03-10 VITALS — BP 140/80 | HR 73 | Ht 63.0 in | Wt 183.8 lb

## 2022-03-10 DIAGNOSIS — G4733 Obstructive sleep apnea (adult) (pediatric): Secondary | ICD-10-CM

## 2022-03-10 NOTE — Patient Instructions (Signed)
Order- [facility]- please clarify why patient still doesn't have his CPAP machine which we ordered in May. If [employer] can't quickly provide a machine, please change DME.   Please let us know if you haven't heard from the [employer] Medical [employer] (DME) [employer] company within a week about getting you a CPAP machine.

## 2022-04-27 ENCOUNTER — Encounter: Payer: Self-pay | Admitting: *Deleted

## 2022-05-12 NOTE — Progress Notes (Deleted)
[facility] [address] [place]. [place] [address] [phone]       STROKE FOLLOW UP NOTE  [name] Date of Birth:  31-Jul-1986 Medical Record Number:  [id number]   Reason for Referral: stroke follow up    SUBJECTIVE:   CHIEF COMPLAINT:  No chief complaint on file.   HPI:   Update 05/13/2022 JM: Patient returns for 76-month stroke follow-up.  Overall stable without new stroke/TIA symptoms.  Reports residual ***.   Compliant on Plavix and atorvastatin.  Blood pressure well controlled.  Closely follows with PCP.  Routinely follows with pulmonology for OSA.       History provided for reference purposes only Update 11/11/2021 JM: 36 year old male with right BG/CR infarct in 05/2021, returns for 75-month follow-up visit.  He is accompanied by his girlfriend who assists with interpretation.  Reports persistent symptoms as discussed at prior visit including intermittent dizziness (only with ambulation), gait impairment, left hand weakness and dysarthria.  GF also mentions some difficulty with short-term memory loss.  He did have a fall back in March due to losing his balance, was evaluated by EMS and as stable, did not proceed to ED.  He has not had any additional falls since that time. Will use cane for long distance. Reports no benefit with use of meclizine currently using 12.5 mg tablet daily (rx'd TID), denies any side effects - he questions increasing dosage.  Still has not been able to participate in any therapies as he remains uninsured, per GF, was denied by Medicaid. GF continues to work with him on exercises. 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  Denies new stroke/TIA symptoms.  Compliant on Plavix and atorvastatin, denies side effects.  Blood pressure today 116/82.  He has since been seen by PCP.  He was evaluated by cardiology who agreed with Dr. Pearlean Brownie regarding no indication for PFO  closure.  Completed cardiac monitor which was negative for atrial fibrillation.  No further concerns at this time.  Initial visit 07/10/2021 JM: Being seen for initial hospital follow-up accompanied by his girlfriend who assists with interpretation.  Interpreter waiver signed.  C/o blurred vision - seen by eye doctor and currently waiting for rx glasses  C/o dizziness after walking for a few minutes.describes this as room spinning sensation.  Will feel off balance at times Left arm weakness - some improvement but does feel increased tightness at night Occasional slurring  No new stroke/TIA symptoms  He has not participated in any type of therapies.  He is currently not working -previously working as a Estate agent.  Requesting FMLA paperwork to be completed.  He is currently applying for disability and Medicaid. He and g/f are concerned regarding cost of doing therapy at this point.   Compliant on Plavix and atorvastatin 80 mg daily -denies side effects Blood pressure today 140/91 -does not routinely monitor at home Appointment pulmonology 08/05/2021 for CPAP  No further concerns at this time   Stroke admission 05/22/2021 [name] is a 36 y.o. 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  He presented to the ED on 05/22/2021 after awakening around 0130 with left sided weakness, left sided facial droop and dysphasia.  Symptoms resolved spontaneously, but patient and significant other state that they come and go.  Personally reviewed hospitalization pertinent progress notes, lab work  and imaging.  Evaluated by Dr. 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  CTA performed reveals narrowing of the right distal M1 segment of the MCA.  EEG performed was negative for epileptiform discharges.  EF 60 to 65%.  LDL 120.  A1c 5.5.   Recommended DAPT for 3 months due to intracranial stenosis and aspirin alone as well as initiated atorvastatin 80 mg daily.  Smoking cessation counseling provided.  PT/OT no therapy needs and discharged home on 05/23/2021.  He returned to ED on 05/24/2021 for worsening confusion, slurred speech, left facial weakness and left hand numbness.  Repeat MRI no significant change from recent imaging or evidence of new stroke.  Evaluated by Dr. Pearlean Brownie who felt recent infarct likely cryptogenic given absence of significant arthrosclerosis elsewhere.  LE Doppler negative for DVT.  TCD bubble negative for PFO.  TEE small PFO, no evidence of thrombus.  ANA and anticardiolipin antibodies negative. Known OSA dx'd over 1 year ago at [facility] but never started CPAP - he was advised to follow back up with them to obtain CPAP.  Recommended continuation of DAPT and statin and discharged home on 05/28/2021     PERTINENT IMAGING  Per hospitalization 05/22/2021 Code Stroke CT head No acute abnormality. ASPECTS 10.  CTA head & neck narrowing and irregularity of the right distal M1 segment of the MCA MRI brain done shows 3.4 cm acute infarct within the right basal ganglia and right corona radiata. MRA head shows moderate stenosis within the distal M1 right middle cerebral artery. No M2 proximal branch occlusion or high-grade proximal stenosis is identified 2D Echo EF 60 to 65%  Per hospitalization 05/24/2021 CTH subacute appearing right basal ganglia and corona radiata infarction MR Brain right BG infarct with no significant regional extension, no new intracranial abnormality 2D Echo EF 60-65%. No atrial shunt TCD no evidence of PFO ANA and anticardiolipin antibodies negative TEE EF 60 to 65%, no evidence of thrombus, evidence of small PFO lower extremities negative for DVT  EEG no seizure activity UDS negative      ROS:   14 system review of systems performed and negative with exception of those listed in  HPI  PMH:  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HCC)    Left-sided weakness 06/17/2021    PSH:  Past Surgical History:  Procedure Laterality Date   BUBBLE STUDY  05/28/2021   Procedure: BUBBLE STUDY;  Surgeon: Chilton Si, MD;  Location: [facility] ENDOSCOPY;  Service: Cardiovascular;;   DECOMPRESSION FASCIOTOMY FOREARM Right    TEE WITHOUT CARDIOVERSION N/A 05/28/2021   Procedure: TRANSESOPHAGEAL ECHOCARDIOGRAM (TEE);  Surgeon: Chilton Si, MD;  Location: [facility] ENDOSCOPY;  Service: Cardiovascular;  Laterality: N/A;    Social History:  Social History   Socioeconomic History   Marital status: Significant Other    Spouse name: Not on file   Number of children: Not on file   Years of education: Not on file   Highest education level: Not on file  Occupational History   Not on file  Tobacco Use   Smoking status: Former    Packs/day: 0.01    Types: Cigarettes    Start date: 08/03/2005    Quit date: 05/03/2021    Years since quitting: 1.0   Smokeless tobacco: Never  Vaping Use   Vaping Use: Never used  Substance and Sexual Activity   Alcohol use: Never   Drug  use: Never   Sexual activity: Not on file  Other Topics Concern   Not on file  Social History Narrative   Not on file   Social Determinants of Health   Financial Resource Strain: Not on file  Food Insecurity: Not on file  Transportation Needs: Not on file  Physical Activity: Not on file  Stress: Not on file  Social Connections: Not on file  Intimate Partner Violence: Not on file    Family History:  Family History  Problem Relation Age of Onset   Diabetes Neg Hx    Cancer Neg Hx    Heart failure Neg Hx    Hyperlipidemia Neg Hx    Hypertension Neg Hx     Medications:   Current Outpatient Medications on File Prior to Visit  Medication Sig Dispense Refill   atorvastatin (LIPITOR) 80 MG tablet TAKE 1 TABLET(80 MG) BY MOUTH DAILY 90 tablet 1   clopidogrel (PLAVIX)  75 MG tablet TAKE 1 TABLET(75 MG) BY MOUTH DAILY 90 tablet 0   meclizine (ANTIVERT) 12.5 MG tablet Take 1 tablet (12.5 mg total) by mouth 3 (three) times daily as needed for dizziness. 90 tablet 5   oxybutynin (DITROPAN-XL) 5 MG 24 hr tablet Take 1 tablet (5 mg total) by mouth at bedtime. 30 tablet 0   No current facility-administered medications on file prior to visit.    Allergies:  No Known Allergies    OBJECTIVE:  Physical Exam  There were no vitals filed for this visit.  There is no height or weight on file to calculate BMI. No results found.  General: well developed, well nourished, pleasant middle-age male, seated, in no evident distress Head: head normocephalic and atraumatic.   Neck: supple with no carotid or supraclavicular bruits Cardiovascular: regular rate and rhythm, no murmurs Musculoskeletal: no deformity Skin:  no rash/petichiae Vascular:  Normal pulses all extremities   Neurologic Exam Mental Status: Awake and fully alert.  Possible slight slurred speech but difficulty fully evaluating due to language barrier.  Is able to understand some English but limited - GF assists with interpretation.  Oriented to place and time. Recent memory mildly impaired and remote memory intact. Attention span, concentration and fund of knowledge appropriate during visit. Mood and affect flat.  Cranial Nerves: Pupils equal, briskly reactive to light. Extraocular movements full without nystagmus. Visual fields full to confrontation. Hearing intact. Facial sensation intact.  Mild left lower facial weakness. tongue, palate moves normally and symmetrically.  Motor: Normal bulk and tone. Normal strength in all tested extremity muscles except mild LUE weakness greater distally although difficulty fully testing due to giveaway weakness Sensory.: intact to touch , pinprick , position and vibratory sensation.  Coordination: Rapid alternating movements normal in all extremities except slightly  decreased left hand. Finger-to-nose and heel-to-shin performed accurately bilaterally. Mildly orbits right arm over left arm Gait and Station: Arises from chair without difficulty, able to stand from seated position with arms crossed. Stance is normal. Gait demonstrates slightly decreased stride length and step height with slow cautious steps with use of cane and GF holding on to other arm, mild unsteadiness initially but quickly resolved.  tandem walk and heel toe with mild difficulty.  Romberg negative Reflexes: 1+ and symmetric. Toes downgoing.          ASSESSMENT: Ernest [name] is a 36 y.o. year old male with right BG/CR infarct on 05/22/2021 initially felt to be due to large vessel disease with distal right M1 stenosis per Dr. Erlinda Hong  but represented on 05/24/2021 with worsening symptoms without evidence of new stroke or extension of prior stroke - eval by Dr. Pearlean Brownie who felt more likely etiology cryptogenic given absence of significant arthrosclerosis elsewhere. Vascular risk factors include HLD, tobacco use, obesity and OSA not on CPAP.      PLAN:  Right BG/CR infarct:  Residual deficit: mild LUE weakness, facial weakness and dizziness/vertigo. Denied by [facility] for insurance coverage.  Provided [facility] financial assistance paperwork in hopes of getting him into therapy as he has not made much progress since prior visit 4 months ago.  Also encouraged GF to contact [employer] and [employer] to possibly be able to work with PT students.  Advised to try taking meclizine 12.5mg  up to three times daily as currently only taking daily with limited benefit.  Discussed potential side effects and possibly that this medication may not relieve his symptoms. Advised to look straight out towards horizon when ambulating instead of  looking down as this could contribute to his dizziness.  Continue to follow with PCP for FMLA paperwork Continue clopidogrel 75 mg daily  and atorvastatin 80 mg  daily for secondary stroke prevention.   Discussed secondary stroke prevention measures and importance of close PCP follow up for aggressive stroke risk factor management including HLD with LDL goal<70.  Lipid panel 11/2021: LDL 44 I have gone over the pathophysiology of stroke, warning signs and symptoms, risk factors and their management in some detail with instructions to go to the closest emergency room for symptoms of concern. PFO: as evidenced on TEE but no evidence on TCD therefore unlikely to be clinically significant OSA: continue to follow with pulmonology     Follow up in 6 months or call earlier if needed   CC:  PCP: Dulce Sellar, NP    I spent 29 minutes of face-to-face and non-face-to-face time with patient and girlfriend.  This included previsit chart review, lab review, study review, electronic health record documentation, patient and girlfriend education regarding prior stroke with residual deficits, secondary stroke prevention measures and importance of managing stroke risk factors, and answered all other questions to patient and girlfriend's satisfaction  Ihor Austin, AGNP-BC  [facility] [address] [place], [place] [address]  Phone [phone] Fax [phone] Note: This document was prepared with digital dictation and possible smart phrase technology. Any transcriptional errors that result from this process are unintentional.

## 2022-05-13 ENCOUNTER — Encounter: Payer: Self-pay | Admitting: Adult Health

## 2022-05-13 ENCOUNTER — Ambulatory Visit: Payer: PRIVATE HEALTH INSURANCE | Admitting: Adult Health

## 2022-06-11 ENCOUNTER — Ambulatory Visit: Payer: PRIVATE HEALTH INSURANCE | Admitting: Internal Medicine

## 2022-07-10 NOTE — Progress Notes (Deleted)
HPI Ernest [name], from [place],  followed for OSA, complicated by CVA, Hx Seizure, Patent Foramen Ovale, OSA, Obesity, Hypocalcemia, Hyperlipidemia,  NPSG [facility] 11/09/19   AHI 18.9/ hr, desaturation to 77%,  ======================================================================   03/10/22- 36 yoM [name] followed for OSA, complicated by CVA, Hx Seizure, Patent Foramen Ovale, OSA, Obesity, Hypocalcemia, Hyperlipidemia,  CPAP auto 5-15/ [employer] Download compliance- Body weight today- Covid vax- -----Pt here for OSA but still hasn't received a CPAP machine. Getting approx 6-7hrs/night, does snore sometimes.  I sent him home, no charge for visit. Return after he gets CPAP  122/11/23- 36 yoM [name] followed for OSA, complicated by CVA, Hx Seizure, Patent Foramen Ovale, OSA, Obesity, Hypocalcemia, Hyperlipidemia,  CPAP auto 5-15/ [employer] Download compliance- Body weight today- Covid vax- Flu vax-      ROS-see HPI  + = positive Constitutional:    weight loss, night sweats, fevers, chills, fatigue, lassitude. HEENT:    headaches, difficulty swallowing, tooth/dental problems, sore throat,       sneezing, itching, ear ache, nasal congestion, post nasal drip, snoring CV:    chest pain, orthopnea, PND, swelling in lower extremities, anasarca,                                   dizziness, palpitations Resp:   +shortness of breath with exertion or at rest.                productive cough,   non-productive cough, coughing up of blood.              change in color of mucus.  wheezing.   Skin:    rash or lesions. GI:  No-   heartburn, indigestion, abdominal pain, nausea, vomiting, diarrhea,                 change in bowel habits, loss of appetite GU: dysuria, change in color of urine, no urgency or frequency.   flank pain. MS:   joint pain, stiffness, decreased range of motion, back pain. Neuro-    L hand numb on waking in AM-? Impingement, + dizziness Psych:  change in mood or affect.  depression or  anxiety.   memory loss.  OBJ- Physical Exam General- Alert, Oriented, Affect-appropriate, Distress- none acute, + obese Skin- rash-none, lesions- none, excoriation- none Lymphadenopathy- none Head- atraumatic            Eyes- Gross vision intact, PERRLA, conjunctivae and secretions clear            Ears- Hearing, canals-normal            Nose- Clear, no-Septal dev, mucus, polyps, erosion, perforation             Throat- Mallampati III-IV , mucosa clear , drainage- none, tonsils+, teeth+ Neck- flexible , trachea midline, no stridor , thyroid nl, carotid no bruit Chest - symmetrical excursion , unlabored           Heart/CV- RRR , +no murmur heard, no gallop  , no rub, nl s1 s2                           - JVD- none , edema- none, stasis changes- none, varices- none           Lung- + trace rattle right base unlabored, wheeze- none, cough- none , dullness-none, rub- none  Chest wall-  Abd-  Br/ Gen/ Rectal- Not done, not indicated Extrem- cyanosis- none, clubbing, none, atrophy- none, strength- nl Neuro- grossly intact to observation

## 2022-07-13 ENCOUNTER — Ambulatory Visit: Payer: PRIVATE HEALTH INSURANCE | Admitting: Internal Medicine

## 2022-07-13 ENCOUNTER — Telehealth: Payer: Self-pay

## 2022-07-16 ENCOUNTER — Encounter: Payer: Self-pay | Admitting: *Deleted

## 2022-07-16 NOTE — Telephone Encounter (Signed)
[place] girlfriend states patient does not have  CPAP machine. Patient does not speak [place]. [employer] phone number is [phone].

## 2022-07-16 NOTE — Telephone Encounter (Signed)
Left number with [employer] for girlfriend. Asked [employer] to call the girlfriend is regards to cpap machine. Nothing further needed

## 2022-08-06 ENCOUNTER — Other Ambulatory Visit: Payer: Self-pay | Admitting: Family

## 2022-08-06 DIAGNOSIS — Z8673 Personal history of transient ischemic attack (TIA), and cerebral infarction without residual deficits: Secondary | ICD-10-CM

## 2022-09-21 ENCOUNTER — Ambulatory Visit: Payer: PRIVATE HEALTH INSURANCE | Admitting: Internal Medicine

## 2022-10-12 ENCOUNTER — Other Ambulatory Visit: Payer: Self-pay

## 2022-10-12 DIAGNOSIS — G4733 Obstructive sleep apnea (adult) (pediatric): Secondary | ICD-10-CM

## 2022-10-12 NOTE — Progress Notes (Deleted)
HPI M Smoker, from [place],  followed for OSA, complicated by CVA, Hx Seizure, Patent Foramen Ovale, OSA, Obesity, Hypocalcemia, Hyperlipidemia,  NPSG [facility] 11/09/19   AHI 18.9/ hr, desaturation to 77%,  ======================================================================   03/10/22- 67 yoM Smoker followed for OSA, complicated by CVA, Hx Seizure, Patent Foramen Ovale, OSA, Obesity, Hypocalcemia, Hyperlipidemia,  CPAP auto 5-15/ [employer] Download compliance- Body weight today- Covid vax- -----Pt here for OSA but still hasn't received a CPAP machine. Getting approx 6-7hrs/night, does snore sometimes.  I sent him home, no charge for visit. Return after he gets CPAP  10/13/22- 39 yoM Smoker followed for OSA, complicated by CVA, Hx Seizure, Patent Foramen Ovale, OSA, Obesity, Hypocalcemia, Hyperlipidemia,  CPAP auto 5-15/ [employer] Download compliance- Body weight today- Covid vax-    ROS-see HPI  + = positive Constitutional:    weight loss, night sweats, fevers, chills, fatigue, lassitude. HEENT:    headaches, difficulty swallowing, tooth/dental problems, sore throat,       sneezing, itching, ear ache, nasal congestion, post nasal drip, snoring CV:    chest pain, orthopnea, PND, swelling in lower extremities, anasarca,                                   dizziness, palpitations Resp:   +shortness of breath with exertion or at rest.                productive cough,   non-productive cough, coughing up of blood.              change in color of mucus.  wheezing.   Skin:    rash or lesions. GI:  No-   heartburn, indigestion, abdominal pain, nausea, vomiting, diarrhea,                 change in bowel habits, loss of appetite GU: dysuria, change in color of urine, no urgency or frequency.   flank pain. MS:   joint pain, stiffness, decreased range of motion, back pain. Neuro-    L hand numb on waking in AM-? Impingement, + dizziness Psych:  change in mood or affect.  depression or anxiety.    memory loss.  OBJ- Physical Exam General- Alert, Oriented, Affect-appropriate, Distress- none acute, + obese Skin- rash-none, lesions- none, excoriation- none Lymphadenopathy- none Head- atraumatic            Eyes- Gross vision intact, PERRLA, conjunctivae and secretions clear            Ears- Hearing, canals-normal            Nose- Clear, no-Septal dev, mucus, polyps, erosion, perforation             Throat- Mallampati III-IV , mucosa clear , drainage- none, tonsils+, teeth+ Neck- flexible , trachea midline, no stridor , thyroid nl, carotid no bruit Chest - symmetrical excursion , unlabored           Heart/CV- RRR , +no murmur heard, no gallop  , no rub, nl s1 s2                           - JVD- none , edema- none, stasis changes- none, varices- none           Lung- + trace rattle right base unlabored, wheeze- none, cough- none , dullness-none, rub- none  Chest wall-  Abd-  Br/ Gen/ Rectal- Not done, not indicated Extrem- cyanosis- none, clubbing, none, atrophy- none, strength- nl Neuro- grossly intact to observation

## 2022-10-13 ENCOUNTER — Ambulatory Visit: Payer: PRIVATE HEALTH INSURANCE | Admitting: Internal Medicine

## 2022-11-04 ENCOUNTER — Other Ambulatory Visit: Payer: Self-pay | Admitting: Family

## 2022-11-04 DIAGNOSIS — Z8673 Personal history of transient ischemic attack (TIA), and cerebral infarction without residual deficits: Secondary | ICD-10-CM

## 2022-11-04 DIAGNOSIS — E785 Hyperlipidemia, unspecified: Secondary | ICD-10-CM

## 2023-01-13 NOTE — Progress Notes (Deleted)
HPI M Smoker, from [place],  followed for OSA, complicated by CVA, Hx Seizure, Patent Foramen Ovale, OSA, Obesity, Hypocalcemia, Hyperlipidemia,  NPSG [facility] 11/09/19   AHI 18.9/ hr, desaturation to 77%,  ======================================================================  12/04/21- 35 yoM Smoker followed for OSA, complicated by CVA, Hx Seizure, Patent Foramen Ovale, OSA, Obesity, Hypocalcemia, Hyperlipidemia,  NPSG [facility] 11/09/19   AHI 18.9/ hr, desaturation to 77%,  Body weight today-190 lbs Comes with a translator today.  Wanted explanation of sleep study results and treatment choices. We are going to start CPAP. He denies new neurologic problems although I see he has been evaluated for dizziness.  03/10/22- 36 yoM Smoker followed for OSA, complicated by CVA, Hx Seizure, Patent Foramen Ovale, OSA, Obesity, Hypocalcemia, Hyperlipidemia,  CPAP auto 5-15/ [employer] Download compliance- Body weight today- Covid vax- -----Pt here for OSA but still hasn't received a CPAP machine. Getting approx 6-7hrs/night, does snore sometimes.  I sent him home, no charge for visit. Return after he gets CPAP  01/15/23- 36 yoM Smoker followed for OSA, complicated by CVA, Hx Seizure, Patent Foramen Ovale, OSA, Obesity, Hypocalcemia, Hyperlipidemia,  CPAP auto 5-15/ [employer]       ordered 10/12/22 Download compliance-  ROS-see HPI  + = positive Constitutional:    weight loss, night sweats, fevers, chills, fatigue, lassitude. HEENT:    headaches, difficulty swallowing, tooth/dental problems, sore throat,       sneezing, itching, ear ache, nasal congestion, post nasal drip, snoring CV:    chest pain, orthopnea, PND, swelling in lower extremities, anasarca,                                   dizziness, palpitations Resp:   +shortness of breath with exertion or at rest.                productive cough,   non-productive cough, coughing up of blood.              change in color of mucus.  wheezing.   Skin:    rash  or lesions. GI:  No-   heartburn, indigestion, abdominal pain, nausea, vomiting, diarrhea,                 change in bowel habits, loss of appetite GU: dysuria, change in color of urine, no urgency or frequency.   flank pain. MS:   joint pain, stiffness, decreased range of motion, back pain. Neuro-    L hand numb on waking in AM-? Impingement, + dizziness Psych:  change in mood or affect.  depression or anxiety.   memory loss.  OBJ- Physical Exam General- Alert, Oriented, Affect-appropriate, Distress- none acute, + obese Skin- rash-none, lesions- none, excoriation- none Lymphadenopathy- none Head- atraumatic            Eyes- Gross vision intact, PERRLA, conjunctivae and secretions clear            Ears- Hearing, canals-normal            Nose- Clear, no-Septal dev, mucus, polyps, erosion, perforation             Throat- Mallampati III-IV , mucosa clear , drainage- none, tonsils+, teeth+ Neck- flexible , trachea midline, no stridor , thyroid nl, carotid no bruit Chest - symmetrical excursion , unlabored           Heart/CV- RRR , +no murmur heard, no gallop  , no rub, nl  s1 s2                           - JVD- none , edema- none, stasis changes- none, varices- none           Lung- + trace rattle right base unlabored, wheeze- none, cough- none , dullness-none, rub- none           Chest wall-  Abd-  Br/ Gen/ Rectal- Not done, not indicated Extrem- cyanosis- none, clubbing, none, atrophy- none, strength- nl Neuro- grossly intact to observation

## 2023-01-15 ENCOUNTER — Ambulatory Visit: Payer: PRIVATE HEALTH INSURANCE | Admitting: Internal Medicine

## 2023-03-29 ENCOUNTER — Emergency Department (HOSPITAL_COMMUNITY): Payer: Medicaid Other

## 2023-03-29 ENCOUNTER — Encounter (HOSPITAL_COMMUNITY): Payer: Self-pay

## 2023-03-29 ENCOUNTER — Emergency Department (HOSPITAL_COMMUNITY)
Admission: EM | Admit: 2023-03-29 | Discharge: 2023-03-29 | Disposition: A | Discharge status: Home / Self Care | Payer: Medicaid Other | Attending: Emergency Medicine | Admitting: Emergency Medicine

## 2023-03-29 ENCOUNTER — Other Ambulatory Visit: Payer: Self-pay

## 2023-03-29 DIAGNOSIS — Y900 Blood alcohol level of less than 20 mg/100 ml: Secondary | ICD-10-CM | POA: Diagnosis not present

## 2023-03-29 DIAGNOSIS — R519 Headache, unspecified: Secondary | ICD-10-CM | POA: Insufficient documentation

## 2023-03-29 DIAGNOSIS — Z7902 Long term (current) use of antithrombotics/antiplatelets: Secondary | ICD-10-CM | POA: Diagnosis not present

## 2023-03-29 DIAGNOSIS — R42 Dizziness and giddiness: Secondary | ICD-10-CM | POA: Insufficient documentation

## 2023-03-29 DIAGNOSIS — R200 Anesthesia of skin: Secondary | ICD-10-CM | POA: Diagnosis not present

## 2023-03-29 DIAGNOSIS — M79602 Pain in left arm: Secondary | ICD-10-CM | POA: Diagnosis not present

## 2023-03-29 DIAGNOSIS — Z8673 Personal history of transient ischemic attack (TIA), and cerebral infarction without residual deficits: Secondary | ICD-10-CM | POA: Diagnosis not present

## 2023-03-29 LAB — COMPREHENSIVE METABOLIC PANEL
ALT: 41 U/L (ref 0–44)
AST: 41 U/L (ref 15–41)
Albumin: 3.5 g/dL (ref 3.5–5.0)
Alkaline Phosphatase: 85 U/L (ref 38–126)
Anion gap: 10 (ref 5–15)
BUN: 7 mg/dL (ref 6–20)
CO2: 21 mmol/L — ABNORMAL LOW (ref 22–32)
Calcium: 8.6 mg/dL — ABNORMAL LOW (ref 8.9–10.3)
Chloride: 107 mmol/L (ref 98–111)
Creatinine, Ser: 0.95 mg/dL (ref 0.61–1.24)
GFR, Estimated: >60 mL/min (ref >60)
Glucose, Bld: 143 mg/dL — ABNORMAL HIGH (ref 70–99)
Potassium: 3.8 mmol/L (ref 3.5–5.1)
Sodium: 138 mmol/L (ref 135–145)
Total Bilirubin: 1 mg/dL (ref 0.3–1.2)
Total Protein: 7.3 g/dL (ref 6.5–8.1)

## 2023-03-29 LAB — DIFFERENTIAL
Abs Immature Granulocytes: 0.03 10*3/uL (ref 0.00–0.07)
Basophils Absolute: 0 10*3/uL (ref 0.0–0.1)
Basophils Relative: 0 %
Eosinophils Absolute: 0.2 10*3/uL (ref 0.0–0.5)
Eosinophils Relative: 2 %
Immature Granulocytes: 0 %
Lymphocytes Relative: 28 %
Lymphs Abs: 2.4 10*3/uL (ref 0.7–4.0)
Monocytes Absolute: 0.6 10*3/uL (ref 0.1–1.0)
Monocytes Relative: 7 %
Neutro Abs: 5.3 10*3/uL (ref 1.7–7.7)
Neutrophils Relative %: 63 %

## 2023-03-29 LAB — ETHANOL: Alcohol, Ethyl (B): <10 mg/dL (ref <10)

## 2023-03-29 LAB — I-STAT CHEM 8, ED
BUN: 7 mg/dL (ref 6–20)
Calcium, Ion: 1.13 mmol/L — ABNORMAL LOW (ref 1.15–1.40)
Chloride: 107 mmol/L (ref 98–111)
Creatinine, Ser: 0.8 mg/dL (ref 0.61–1.24)
Glucose, Bld: 148 mg/dL — ABNORMAL HIGH (ref 70–99)
HCT: 44 % (ref 39.0–52.0)
Hemoglobin: 15 g/dL (ref 13.0–17.0)
Potassium: 3.8 mmol/L (ref 3.5–5.1)
Sodium: 141 mmol/L (ref 135–145)
TCO2: 22 mmol/L (ref 22–32)

## 2023-03-29 LAB — CBC
HCT: 42.9 % (ref 39.0–52.0)
Hemoglobin: 14.4 g/dL (ref 13.0–17.0)
MCH: 28.9 pg (ref 26.0–34.0)
MCHC: 33.6 g/dL (ref 30.0–36.0)
MCV: 86.1 fL (ref 80.0–100.0)
Platelets: 245 10*3/uL (ref 150–400)
RBC: 4.98 MIL/uL (ref 4.22–5.81)
RDW: 13.8 % (ref 11.5–15.5)
WBC: 8.5 10*3/uL (ref 4.0–10.5)
nRBC: 0 % (ref 0.0–0.2)

## 2023-03-29 LAB — PROTIME-INR
INR: 1 (ref 0.8–1.2)
Prothrombin Time: 13.8 seconds (ref 11.4–15.2)

## 2023-03-29 LAB — APTT: aPTT: 32 seconds (ref 24–36)

## 2023-03-29 MED ORDER — MECLIZINE HCL 25 MG PO TABS: 25.0000 mg | ORAL_TABLET | Freq: Three times a day (TID) | ORAL | 0 refills | Status: AC | PRN

## 2023-03-29 MED ORDER — MECLIZINE HCL 25 MG PO TABS
25.0000 mg | ORAL_TABLET | Freq: Once | ORAL | Status: AC
  Administered 2023-03-29: 25 mg via ORAL
  Filled 2023-03-29: qty 1

## 2023-03-29 MED ORDER — SODIUM CHLORIDE 0.9% FLUSH: 3.0000 mL | Freq: Once | INTRAVENOUS | Status: DC

## 2023-03-29 NOTE — ED Provider Notes (Signed)
[place] EMERGENCY DEPARTMENT AT [facility] Provider Note   CSN: [id number] Arrival date & time: 03/29/23  1529     History {Add pertinent medical, surgical, social history, OB history to HPI:1} Chief Complaint  Patient presents with   Dizziness   Headache    Tully Sakamoto is a 37 y.o. male.  With a history of CVA, OSA, hyperlipidemia, PFO presenting for dizziness.  He reports that he has had dizziness when walking for the past 3 days.  He has additionally had increased difficulty sleeping as well as mild right sided headache over the same period of time.  He reported in triage that he was additionally having arm pain and numbness however on my interview states that he only has numbness in his left hand shortly after waking up when sleeping on it.  He is not dizzy at rest.  Denies neck pain, fever, chills.   Dizziness Associated symptoms: headaches   Headache Associated symptoms: dizziness        Home Medications Prior to Admission medications   Medication Sig Start Date End Date Taking? Authorizing Provider  atorvastatin (LIPITOR) 80 MG tablet TAKE 1 TABLET(80 MG) BY MOUTH DAILY 11/05/21   Dulce Sellar, NP  clopidogrel (PLAVIX) 75 MG tablet TAKE 1 TABLET(75 MG) BY MOUTH DAILY 08/06/22   Dulce Sellar, NP  meclizine (ANTIVERT) 12.5 MG tablet Take 1 tablet (12.5 mg total) by mouth 3 (three) times daily as needed for dizziness. 11/11/21   Ihor Austin, NP  oxybutynin (DITROPAN-XL) 5 MG 24 hr tablet Take 1 tablet (5 mg total) by mouth at bedtime. 12/19/21   Dulce Sellar, NP      Allergies    Patient has no known allergies.    Review of Systems   Review of Systems  Neurological:  Positive for dizziness and headaches.    Physical Exam Updated Vital Signs BP 107/75   Pulse 76   Temp 98.8 F (37.1 C) (Oral)   Resp 20   Ht 5\' 3"  (1.6 m)   Wt 83.4 kg   SpO2 100%   BMI 32.57 kg/m  Physical Exam Vitals and nursing note reviewed.   Constitutional:      General: He is not in acute distress.    Appearance: He is well-developed.  HENT:     Head: Normocephalic and atraumatic.  Eyes:     Conjunctiva/sclera: Conjunctivae normal.  Cardiovascular:     Rate and Rhythm: Normal rate and regular rhythm.     Heart sounds: No murmur heard. Pulmonary:     Effort: Pulmonary effort is normal. No respiratory distress.     Breath sounds: Normal breath sounds.  Abdominal:     Palpations: Abdomen is soft.     Tenderness: There is no abdominal tenderness.  Musculoskeletal:        General: No swelling.     Cervical back: Neck supple.  Skin:    General: Skin is warm and dry.     Capillary Refill: Capillary refill takes less than 2 seconds.  Neurological:     Mental Status: He is alert and oriented to person, place, and time.     GCS: GCS eye subscore is 4. GCS verbal subscore is 5. GCS motor subscore is 6.     Cranial Nerves: No cranial nerve deficit, dysarthria or facial asymmetry.     Sensory: No sensory deficit.     Motor: No weakness.     Coordination: Romberg sign negative. Coordination normal.  Gait: Gait normal.  Psychiatric:        Mood and Affect: Mood normal.     ED Results / Procedures / Treatments   Labs (all labs ordered are listed, but only abnormal results are displayed) Labs Reviewed  COMPREHENSIVE METABOLIC PANEL - Abnormal; Notable for the following components:      Result Value   CO2 21 (*)    Glucose, Bld 143 (*)    Calcium 8.6 (*)    All other components within normal limits  I-STAT CHEM 8, ED - Abnormal; Notable for the following components:   Glucose, Bld 148 (*)    Calcium, Ion 1.13 (*)    All other components within normal limits  PROTIME-INR  APTT  CBC  DIFFERENTIAL  ETHANOL  CBG MONITORING, ED    EKG EKG Interpretation Date/Time:  Monday March 29 2023 15:45:38 EDT Ventricular Rate:  70 PR Interval:  122 QRS Duration:  88 QT Interval:  370 QTC Calculation: 399 R  Axis:   37  Text Interpretation: Normal sinus rhythm Normal ECG When compared with ECG of 03-Jul-2021 11:54, PREVIOUS ECG IS PRESENT unchanged compared to 2022 Confirmed by Eber Hong [phone]) on 03/29/2023 4:34:12 PM  Radiology CT HEAD WO CONTRAST  Result Date: 03/29/2023 CLINICAL DATA:  Dizziness, headache EXAM: CT HEAD WITHOUT CONTRAST TECHNIQUE: Contiguous axial images were obtained from the base of the skull through the vertex without intravenous contrast. RADIATION DOSE REDUCTION: This exam was performed according to the departmental dose-optimization program which includes automated exposure control, adjustment of the mA and/or kV according to patient size and/or use of iterative reconstruction technique. COMPARISON:  05/24/2021 FINDINGS: Brain: Focal encephalomalacia within the right basal ganglia consistent with sequela of prior infarct. No evidence of acute infarct or hemorrhage. Lateral ventricles and remaining midline structures are unremarkable. No acute extra-axial fluid collections. No mass effect. Vascular: No hyperdense vessel or unexpected calcification. Skull: Normal. Negative for fracture or focal lesion. Sinuses/Orbits: Prominent mucosal thickening and fluid within the bilateral maxillary and frontal sinuses, with partial opacification. Mild mucoperiosteal thickening throughout the ethmoid air cells. Chronic right mastoid effusion. Other: None. IMPRESSION: 1. Focal encephalomalacia related to chronic right basal ganglia infarct. 2. No acute infarct or hemorrhage. 3. Chronic pansinusitis and right mastoid effusion. Electronically Signed   By: Sharlet Salina M.D.   On: 03/29/2023 16:57    Procedures Procedures  {Document cardiac monitor, telemetry assessment procedure when appropriate:1}  Medications Ordered in ED Medications  sodium chloride flush (NS) 0.9 % injection 3 mL (0 mLs Intravenous Hold 03/29/23 1634)    ED Course/ Medical Decision Making/ A&P   {   Click here for  ABCD2, HEART and other calculatorsREFRESH Note before signing :1}                              Medical Decision Making Amount and/or Complexity of Data Reviewed Labs: ordered. Radiology: ordered.   Patient is a 37 year old male with a history of CVA, hyperlipidemia, PFO presenting for  a 3 day history of dizziness.  On my initial evaluation, he is afebrile, hemodynamically stable, in no acute distress.  There is no focal neurodeficit on exam.  His gait is normal and without ataxia.  Given patient's past medical history, presentation is most concerning for posterior circulation stroke.  Head CT obtained from triage is without evidence of intracranial hemorrhage or stroke.  Given high risk, will obtain MRI to further evaluate.  Additional screening labs obtained and reviewed.  There is no leukocytosis to suggest infection.  Electrolytes are grossly normal, no significant metabolic abnormality, hypoglycemia, AKI or anion gap.  ***  {Document critical care time when appropriate:1} {Document review of labs and clinical decision tools ie heart score, Chads2Vasc2 etc:1}  {Document your independent review of radiology images, and any outside records:1} {Document your discussion with family members, caretakers, and with consultants:1} {Document social determinants of health affecting pt's care:1} {Document your decision making why or why not admission, treatments were needed:1} Final Clinical Impression(s) / ED Diagnoses Final diagnoses:  None    Rx / DC Orders ED Discharge Orders     None

## 2023-03-29 NOTE — ED Provider Notes (Incomplete)
[place] EMERGENCY DEPARTMENT AT [facility] Provider Note   CSN: [id number] Arrival date & time: 03/29/23  1529     History {Add pertinent medical, surgical, social history, OB history to HPI:1} Chief Complaint  Patient presents with   Dizziness   Headache    Ernest [name] is a 37 y.o. male.   Dizziness Associated symptoms: headaches   Headache Associated symptoms: dizziness        Home Medications Prior to Admission medications   Medication Sig Start Date End Date Taking? Authorizing Provider  atorvastatin (LIPITOR) 80 MG tablet TAKE 1 TABLET(80 MG) BY MOUTH DAILY 11/05/21   Dulce Sellar, NP  clopidogrel (PLAVIX) 75 MG tablet TAKE 1 TABLET(75 MG) BY MOUTH DAILY 08/06/22   Dulce Sellar, NP  meclizine (ANTIVERT) 12.5 MG tablet Take 1 tablet (12.5 mg total) by mouth 3 (three) times daily as needed for dizziness. 11/11/21   Ihor Austin, NP  oxybutynin (DITROPAN-XL) 5 MG 24 hr tablet Take 1 tablet (5 mg total) by mouth at bedtime. 12/19/21   Dulce Sellar, NP      Allergies    Patient has no known allergies.    Review of Systems   Review of Systems  Neurological:  Positive for dizziness and headaches.    Physical Exam Updated Vital Signs BP 133/89   Pulse 78   Temp 98.8 F (37.1 C) (Oral)   Resp 16   Ht 5\' 3"  (1.6 m)   Wt 83.4 kg   SpO2 98%   BMI 32.57 kg/m  Physical Exam  ED Results / Procedures / Treatments   Labs (all labs ordered are listed, but only abnormal results are displayed) Labs Reviewed  I-STAT CHEM 8, ED - Abnormal; Notable for the following components:      Result Value   Glucose, Bld 148 (*)    Calcium, Ion 1.13 (*)    All other components within normal limits  PROTIME-INR  APTT  CBC  DIFFERENTIAL  COMPREHENSIVE METABOLIC PANEL  ETHANOL  CBG MONITORING, ED    EKG EKG Interpretation Date/Time:  Monday March 29 2023 15:45:38 EDT Ventricular Rate:  70 PR Interval:  122 QRS Duration:  88 QT  Interval:  370 QTC Calculation: 399 R Axis:   37  Text Interpretation: Normal sinus rhythm Normal ECG When compared with ECG of 03-Jul-2021 11:54, PREVIOUS ECG IS PRESENT unchanged compared to 2022 Confirmed by Eber Hong ([id number]) on 03/29/2023 4:34:12 PM  Radiology No results found.  Procedures Procedures  {Document cardiac monitor, telemetry assessment procedure when appropriate:1}  Medications Ordered in ED Medications  sodium chloride flush (NS) 0.9 % injection 3 mL (0 mLs Intravenous Hold 03/29/23 1634)    ED Course/ Medical Decision Making/ A&P   {   Click here for ABCD2, HEART and other calculatorsREFRESH Note before signing :1}                              Medical Decision Making Amount and/or Complexity of Data Reviewed Labs: ordered. Radiology: ordered.   ***  {Document critical care time when appropriate:1} {Document review of labs and clinical decision tools ie heart score, Chads2Vasc2 etc:1}  {Document your independent review of radiology images, and any outside records:1} {Document your discussion with family members, caretakers, and with consultants:1} {Document social determinants of health affecting pt's care:1} {Document your decision making why or why not admission, treatments were needed:1} Final Clinical Impression(s) / ED Diagnoses Final diagnoses:  None    Rx / DC Orders ED Discharge Orders     None

## 2023-03-29 NOTE — Progress Notes (Unsigned)
HPI M Smoker, from [place],  followed for OSA, complicated by CVA, Hx Seizure, Patent Foramen Ovale, OSA, Obesity, Hypocalcemia, Hyperlipidemia,  NPSG [facility] 11/09/19   AHI 18.9/ hr, desaturation to 77%,  ======================================================================   03/10/22- 36 yoM Smoker followed for OSA, complicated by CVA, Hx Seizure, Patent Foramen Ovale, OSA, Obesity, Hypocalcemia, Hyperlipidemia,  CPAP auto 5-15/ [facility] Download compliance- Body weight today- Covid vax- -----Pt here for OSA but still hasn't received a CPAP machine. Getting approx 6-7hrs/night, does snore sometimes.  I sent him home, no charge for visit. Return after he gets CPAP  03/30/23-  37 yoM former smoker followed for OSA, complicated by CVA, Hx Seizure, Patent Foramen Ovale, OSA, Obesity, Hypocalcemia, Hyperlipidemia,  CPAP auto 5-15/ [facility] Download compliance-   0%, AHI1/ hr Body weight today- ED 8/26- dizziness, headache. -----OSA not using CPAP   ROS-see HPI  + = positive Constitutional:    weight loss, night sweats, fevers, chills, fatigue, lassitude. HEENT:    headaches, difficulty swallowing, tooth/dental problems, sore throat,       sneezing, itching, ear ache, nasal congestion, post nasal drip, snoring CV:    chest pain, orthopnea, PND, swelling in lower extremities, anasarca,                                   dizziness, palpitations Resp:   +shortness of breath with exertion or at rest.                productive cough,   non-productive cough, coughing up of blood.              change in color of mucus.  wheezing.   Skin:    rash or lesions. GI:  No-   heartburn, indigestion, abdominal pain, nausea, vomiting, diarrhea,                 change in bowel habits, loss of appetite GU: dysuria, change in color of urine, no urgency or frequency.   flank pain. MS:   joint pain, stiffness, decreased range of motion, back pain. Neuro-    L hand numb on waking in AM-? Impingement, +  dizziness Psych:  change in mood or affect.  depression or anxiety.   memory loss.  OBJ- Physical Exam General- Alert, Oriented, Affect-appropriate, Distress- none acute, + obese Skin- rash-none, lesions- none, excoriation- none Lymphadenopathy- none Head- atraumatic            Eyes- Gross vision intact, PERRLA, conjunctivae and secretions clear            Ears- Hearing, canals-normal            Nose- Clear, no-Septal dev, mucus, polyps, erosion, perforation             Throat- Mallampati III-IV , mucosa clear , drainage- none, tonsils+, teeth+ Neck- flexible , trachea midline, no stridor , thyroid nl, carotid no bruit Chest - symmetrical excursion , unlabored           Heart/CV- RRR , +no murmur heard, no gallop  , no rub, nl s1 s2                           - JVD- none , edema- none, stasis changes- none, varices- none           Lung- + trace rattle right base unlabored, wheeze- none, cough- none ,  dullness-none, rub- none           Chest wall-  Abd-  Br/ Gen/ Rectal- Not done, not indicated Extrem- cyanosis- none, clubbing, none, atrophy- none, strength- nl Neuro- grossly intact to observation

## 2023-03-29 NOTE — ED Notes (Signed)
Patient going to MRI

## 2023-03-29 NOTE — ED Triage Notes (Signed)
Pt c/o dizziness, HA and right arm painx3d.

## 2023-03-29 NOTE — ED Notes (Signed)
Patient verbalizes understanding of discharge instructions. Opportunity for questioning and answers were provided. Armband removed by staff, pt discharged from ED. Pt ambulatory to ED waiting room with steady gait.  

## 2023-03-29 NOTE — Discharge Instructions (Addendum)
You were seen today for dizziness.  Your MRI did not show any signs of new stroke.  I sent a prescription for meclizine to help with dizziness.  Please return to the emergency department if you are having new or worsening headache, nausea, vomiting, vision change.  Please make an appointment to follow-up with your primary care doctor this week.

## 2023-03-30 ENCOUNTER — Encounter: Payer: Self-pay | Admitting: Internal Medicine

## 2023-03-30 ENCOUNTER — Ambulatory Visit (INDEPENDENT_AMBULATORY_CARE_PROVIDER_SITE_OTHER): Payer: Medicaid Other | Admitting: Internal Medicine

## 2023-03-30 VITALS — BP 118/78 | HR 69 | Temp 98.1°F | Ht 63.0 in | Wt 192.2 lb

## 2023-03-30 DIAGNOSIS — F17200 Nicotine dependence, unspecified, uncomplicated: Secondary | ICD-10-CM | POA: Diagnosis not present

## 2023-03-30 DIAGNOSIS — G4733 Obstructive sleep apnea (adult) (pediatric): Secondary | ICD-10-CM | POA: Diagnosis not present

## 2023-03-30 NOTE — Patient Instructions (Signed)
Order- refer to orthodontist Dr Althea Grimmer  consider oral appliance for OSA  Order- DME [facility]- please refit CPAP mask to try nasal mask or nasal pillows for comfort and better seal. Continue auto 5-15  Please call as needed

## 2023-03-31 ENCOUNTER — Encounter: Payer: Self-pay | Admitting: Internal Medicine

## 2023-03-31 NOTE — Assessment & Plan Note (Signed)
I took him a long time to get a CPAP and now that he has it, he has not been comfortable wearing it.  We talked through this. Plan-Adapt is to change him to a nasal or nasal pillows mask or hybrid.  Meanwhile we will refer to Dr. Myrtis Ser to consider oral appliance.

## 2023-03-31 NOTE — Assessment & Plan Note (Signed)
They report that he has stopped smoking.  Encouraged to stay off.

## 2023-04-06 ENCOUNTER — Encounter: Payer: Self-pay | Admitting: Internal Medicine

## 2023-04-11 ENCOUNTER — Other Ambulatory Visit: Payer: Self-pay | Admitting: Family

## 2023-04-11 DIAGNOSIS — Z8673 Personal history of transient ischemic attack (TIA), and cerebral infarction without residual deficits: Secondary | ICD-10-CM

## 2023-05-26 ENCOUNTER — Telehealth: Payer: Self-pay | Admitting: Internal Medicine

## 2023-05-26 NOTE — Telephone Encounter (Signed)
We will need a new order please

## 2023-05-26 NOTE — Telephone Encounter (Signed)
Sig other calling about CPAP machine order. Please see notes on order. They state: I'm seeing a note in our system from 09/05/22 stating that the patient requested we cancel orders that he was going to use another DME. Do we need to process a new order for him? Please call @ [phone]

## 2023-05-27 NOTE — Telephone Encounter (Signed)
Lm x1 for patient's spouse(DPR). Order placed to [facility] 3/11 for cpap machine. Did he receive this machine?

## 2023-05-27 NOTE — Telephone Encounter (Signed)
PT calling back and they did GET this machine from [facility] but it was uncomfortable and the Dr. York Spaniel he'd order a new one. That was 2 mo ago. Pls call sig other @ [phone]

## 2023-05-27 NOTE — Telephone Encounter (Signed)
Lm x1 for patient's spouse, Saprana (DPR)

## 2023-05-27 NOTE — Telephone Encounter (Signed)
My order fr CPAP usually says mask of choice, so he can probably just speak to the DME about changing. But ok to send DME an order "Please refit/ change mask of choice for comfort and seal".

## 2023-05-27 NOTE — Telephone Encounter (Signed)
Spoke to patient's spouse, Saprana(DPR). She stated that patient received new cpap machine, however pt can not tolerate mask. She would like a order for cpap mask.  Dr. Maple Hudson, please advise. thanks

## 2023-05-28 ENCOUNTER — Telehealth: Payer: Self-pay | Admitting: Internal Medicine

## 2023-05-28 DIAGNOSIS — G4733 Obstructive sleep apnea (adult) (pediatric): Secondary | ICD-10-CM

## 2023-05-28 NOTE — Telephone Encounter (Signed)
Patient's spouse is calling back in reference to last encounter.

## 2023-05-28 NOTE — Telephone Encounter (Signed)
Lm x2 for patient's spouse, Saprana(DPR). Will close encounter per office protocol.

## 2023-06-01 NOTE — Telephone Encounter (Signed)
Called pt girlfriend as listed in dpr. No answer lvmm for pt to give office a call back. Order was placed 8/27 pt will need to call DME company to f/u

## 2023-06-03 ENCOUNTER — Encounter: Payer: Self-pay | Admitting: Neurology

## 2023-06-03 ENCOUNTER — Ambulatory Visit (INDEPENDENT_AMBULATORY_CARE_PROVIDER_SITE_OTHER): Payer: Medicaid Other | Admitting: Neurology

## 2023-06-03 VITALS — BP 128/80 | HR 65 | Ht 63.0 in | Wt 196.0 lb

## 2023-06-03 DIAGNOSIS — Z8673 Personal history of transient ischemic attack (TIA), and cerebral infarction without residual deficits: Secondary | ICD-10-CM | POA: Diagnosis not present

## 2023-06-03 DIAGNOSIS — R200 Anesthesia of skin: Secondary | ICD-10-CM | POA: Diagnosis not present

## 2023-06-03 DIAGNOSIS — R42 Dizziness and giddiness: Secondary | ICD-10-CM | POA: Diagnosis not present

## 2023-06-03 NOTE — Progress Notes (Signed)
[facility] [address] [place] [address] [phone]       STROKE FOLLOW UP NOTE  [name] Date of Birth:  10-01-1985 Medical Record Number:  [id number]   Reason for Referral: stroke follow up    SUBJECTIVE:   CHIEF COMPLAINT:  Chief Complaint  Patient presents with   Follow-up    Rm 20, here with girlfriend SAPANA Pt is here to reestablish care, hx of CVA. Pt's girlfriend states pt has numbness on left arm/hand mostly at night. Pt states he is losing his hearing on both ears and states he is waiting for his PCP to refer for specialist.     HPI:   Update 11/11/2021 Ernest [name]: 37 year old male with right BG/CR infarct in 05/2021, returns for 50-month follow-up visit.  He is accompanied by his girlfriend who assists with interpretation.  Reports persistent symptoms as discussed at prior visit including intermittent dizziness (only with ambulation), gait impairment, left hand weakness and dysarthria.  GF also mentions some difficulty with short-term memory loss.  He did have a fall back in March due to losing his balance, was evaluated by EMS and as stable, did not proceed to ED.  He has not had any additional falls since that time. Will use cane for long distance. Reports no benefit with use of meclizine currently using 12.5 mg tablet daily (rx'd TID), denies any side effects - he questions increasing dosage.  Still has not been able to participate in any therapies as he remains uninsured, per GF, was denied by Medicaid. GF continues to work with him on exercises. 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  Denies new stroke/TIA symptoms.  Compliant on Plavix and atorvastatin, denies side effects.  Blood pressure today 116/82.  He has since been seen by PCP.  He was evaluated by cardiology who agreed with Dr. Pearlean Brownie regarding no indication for PFO closure.  Completed cardiac monitor which was negative  for atrial fibrillation.  No further concerns at this time.   Update 06/03/2023 : Patient was referred back for stroke follow-up today by primary care physician as he was lost to follow-up and had no insurance.  He is accompanied by his girlfriend today.  He states he has had no recurrent stroke or TIA symptoms.  He still has some residual left hand numbness which is notices mostly at night.  This is not bothersome.  He also complains of intermittent dizziness.  This infarct got worse and he was seen in the ER on 03/29/2023 and had MRI of the brain which showed no acute abnormality.  He has been prescribed meclizine which she takes as needed and finds it helpful.  Patient continues to take Plavix which is tolerating well without bruising or bleeding.  He had stopped taking Lipitor which she has now since restarted.  He does have obstructive sleep apnea and does snore.  He had some problem with the CPAP mask and has not been using it and has asked for replacement which has not arrived yet.  He has applied for long-term disability and has an appointment with Social Security later this week.  He has quit smoking cigarettes and drinking alcohol completely.  He has no new complaints.   History provided for reference purposes only Initial visit 07/10/2021 Ernest [name]: Being seen for initial hospital follow-up accompanied by his girlfriend who assists with interpretation.  Interpreter waiver signed.  C/o blurred vision -  seen by eye doctor and currently waiting for rx glasses  C/o dizziness after walking for a few minutes.describes this as room spinning sensation.  Will feel off balance at times Left arm weakness - some improvement but does feel increased tightness at night Occasional slurring  No new stroke/TIA symptoms  He has not participated in any type of therapies.  He is currently not working -previously working as a Estate agent.  Requesting FMLA paperwork to be completed.  He is currently applying for  disability and Medicaid. He and g/f are concerned regarding cost of doing therapy at this point.   Compliant on Plavix and atorvastatin 80 mg daily -denies side effects Blood pressure today 140/91 -does not routinely monitor at home Appointment pulmonology 08/05/2021 for CPAP  No further concerns at this time   Stroke admission 05/22/2021 [name] is a 37 y.o. 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  He presented to the ED on 05/22/2021 after awakening around 0130 with left sided weakness, left sided facial droop and dysphasia.  Symptoms resolved spontaneously, but patient and significant other state that they come and go.  Personally reviewed hospitalization pertinent progress notes, lab work and imaging.  Evaluated by Dr. 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  CTA performed reveals narrowing of the right distal M1 segment of the MCA.  EEG performed was negative for epileptiform discharges.  EF 60 to 65%.  LDL 120.  A1c 5.5.  Recommended DAPT for 3 months due to intracranial stenosis and aspirin alone as well as initiated atorvastatin 80 mg daily.  Smoking cessation counseling provided.  PT/OT no therapy needs and discharged home on 05/23/2021.  He returned to ED on 05/24/2021 for worsening confusion, slurred speech, left facial weakness and left hand numbness.  Repeat MRI no significant change from recent imaging or evidence of new stroke.  Evaluated by Dr. Pearlean Brownie who felt recent infarct likely cryptogenic given absence of significant arthrosclerosis elsewhere.  LE Doppler negative for DVT.  TCD bubble negative for PFO.  TEE small PFO, no evidence of thrombus.  ANA and anticardiolipin antibodies negative. Known OSA dx'd over 1 year ago at [facility] but never started CPAP - he was advised to follow back up with them to obtain CPAP.   Recommended continuation of DAPT and statin and discharged home on 05/28/2021     PERTINENT IMAGING  Per hospitalization 05/22/2021 Code Stroke CT head No acute abnormality. ASPECTS 10.  CTA head & neck narrowing and irregularity of the right distal M1 segment of the MCA MRI brain done shows 3.4 cm acute infarct within the right basal ganglia and right corona radiata. MRA head shows moderate stenosis within the distal M1 right middle cerebral artery. No M2 proximal branch occlusion or high-grade proximal stenosis is identified 2D Echo EF 60 to 65%  Per hospitalization 05/24/2021 CTH subacute appearing right basal ganglia and corona radiata infarction MR Brain right BG infarct with no significant regional extension, no new intracranial abnormality 2D Echo EF 60-65%. No atrial shunt TCD no evidence of PFO ANA and anticardiolipin antibodies negative TEE EF 60 to 65%, no evidence of thrombus, evidence of small PFO lower extremities negative for DVT  EEG no seizure activity UDS negative      ROS:   14 system review of systems performed and negative with exception of those listed  in HPI  PMH:  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HCC)    Left-sided weakness 06/17/2021   Stroke [facility])     PSH:  Past Surgical History:  Procedure Laterality Date   BUBBLE STUDY  05/28/2021   Procedure: BUBBLE STUDY;  Surgeon: Chilton Si, MD;  Location: [facility] ENDOSCOPY;  Service: Cardiovascular;;   DECOMPRESSION FASCIOTOMY FOREARM Right    TEE WITHOUT CARDIOVERSION N/A 05/28/2021   Procedure: TRANSESOPHAGEAL ECHOCARDIOGRAM (TEE);  Surgeon: Chilton Si, MD;  Location: [facility] ENDOSCOPY;  Service: Cardiovascular;  Laterality: N/A;    Social History:  Social History   Socioeconomic History   Marital status: Significant Other    Spouse name: Not on file   Number of children: Not on file   Years of education: Not on file   Highest education level: Not  on file  Occupational History   Not on file  Tobacco Use   Smoking status: Former    Current packs/day: 0.00    Average packs/day: (0.2 ttl pk-yrs)    Types: Cigarettes    Start date: 08/03/2005    Quit date: 05/03/2021    Years since quitting: 2.0   Smokeless tobacco: Never  Vaping Use   Vaping status: Never Used  Substance and Sexual Activity   Alcohol use: Never   Drug use: Never   Sexual activity: Not on file  Other Topics Concern   Not on file  Social History Narrative   Not on file   Social Determinants of Health   Financial Resource Strain: Medium Risk (01/01/2023)   [facility]   Overall Financial Resource Strain (CARDIA)    Difficulty of Paying Living Expenses: Somewhat hard  Food Insecurity: Food Insecurity Present (01/01/2023)   [facility] from [facility], [facility]   Hunger Vital Sign    Worried About Running Out of Food in the Last Year: Sometimes true    Ran Out of Food in the Last Year: Never true  Transportation Needs: Unmet Transportation Needs (01/01/2023)   Received from [facility], [facility]   PRAPARE - Transportation    Lack of Transportation (Medical): Yes    Lack of Transportation (Non-Medical): Yes  Physical Activity: Inactive (07/23/2019)   Received from [facility] visits prior to 10/03/2022., [facility] visits prior to 10/03/2022.   Exercise Vital Sign    Days of Exercise per Week: 0 days    Minutes of Exercise per Session: 0 min  Stress: Stress Concern Present (07/23/2019)   Received from [facility] visits prior to 10/03/2022., [facility] visits prior to 10/03/2022.   [employer] of Occupational Health - Occupational Stress Questionnaire    Feeling of Stress : Rather much  Social Connections: Unknown (11/12/2022)   Received from [facility], [facility]   Social Network    Social Network: Not on file  Intimate  Partner Violence: Unknown (11/12/2022)   Received from [facility], [facility]   HITS    Physically Hurt: Not on file    Insult or Talk Down To: Not on file    Threaten Physical Harm: Not on file    Scream or Curse: Not on file    Family History:  Family History  Problem Relation Age of Onset   Diabetes Neg Hx    Cancer Neg Hx    Heart failure Neg Hx    Hyperlipidemia Neg Hx  Hypertension Neg Hx     Medications:   Current Outpatient Medications on File Prior to Visit  Medication Sig Dispense Refill   acetaminophen (TYLENOL) 325 MG tablet Take 650 mg by mouth every 6 (six) hours as needed for mild pain (pain score 1-3).     atorvastatin (LIPITOR) 80 MG tablet TAKE 1 TABLET(80 MG) BY MOUTH DAILY 90 tablet 1   clopidogrel (PLAVIX) 75 MG tablet TAKE 1 TABLET(75 MG) BY MOUTH DAILY 90 tablet 0   meclizine (ANTIVERT) 25 MG tablet Take 1 tablet (25 mg total) by mouth 3 (three) times daily as needed for dizziness. 30 tablet 0   oxybutynin (DITROPAN-XL) 5 MG 24 hr tablet Take 1 tablet (5 mg total) by mouth at bedtime. 30 tablet 0   No current facility-administered medications on file prior to visit.    Allergies:  No Known Allergies    OBJECTIVE:  Physical Exam  Vitals:   06/03/23 1447  BP: 128/80  Pulse: 65  Weight: 196 lb (88.9 kg)  Height: 5\' 3"  (1.6 m)   Body mass index is 34.72 kg/m. No results found.  General: Mildly obese middle-aged [place] male pleasant middle-age male, seated, in no evident distress Head: head normocephalic and atraumatic.   Neck: supple with no carotid or supraclavicular bruits Cardiovascular: regular rate and rhythm, no murmurs Musculoskeletal: no deformity Skin:  no rash/petichiae Vascular:  Normal pulses all extremities   Neurologic Exam Mental Status: Awake and fully alert.  Possible slight slurred speech but difficulty fully evaluating due to language barrier.  Is able to understand some English but limited - GF assists with  interpretation.  Oriented to place and time. Recent memory mildly impaired and remote memory intact. Attention span, concentration and fund of knowledge appropriate during visit. Mood and affect flat.  Cranial Nerves: Pupils equal, briskly reactive to light. Extraocular movements full without nystagmus. Visual fields full to confrontation. Hearing intact. Facial sensation intact.  Mild left lower facial weakness. tongue, palate moves normally and symmetrically.  Motor: Normal bulk and tone. Normal strength in all tested extremity muscles except mild LUE weakness greater distally although difficulty fully testing due to giveaway weakness.  Diminished fine finger movements on the left.  Orbits right over left upper extremity. Sensory.: intact to touch , pinprick , position and vibratory sensation.  Coordination: Rapid alternating movements normal in all extremities except slightly decreased left hand. Finger-to-nose and heel-to-shin performed accurately bilaterally. Mildly orbits right arm over left arm Gait and Station: Arises from chair without difficulty, able to stand from seated position with arms crossed. Stance is normal. Gait demonstrates slightly decreased stride length and step height with slow cautious steps with use of cane and GF holding on to other arm, mild unsteadiness initially but quickly resolved.  tandem walk and heel toe with mild difficulty.  Romberg negative Reflexes: 1+ and symmetric. Toes downgoing.          ASSESSMENT: Ernest [name] is a 37 y.o. year old male with recent right BG/CR infarct on 05/22/2021 initially felt to be due to large vessel disease with distal right M1 stenosis per Dr. Roda Shutters but represented on 05/24/2021 with worsening symptoms without evidence of new stroke or extension of prior stroke - eval by Dr. Pearlean Brownie who felt more likely etiology cryptogenic given absence of significant arthrosclerosis elsewhere. Vascular risk factors include HLD, tobacco use, obesity  and OSA not on CPAP.  He is doing well except mild residual left-sided paresthesias and dizziness.     PLAN:  I had a long d/w patient and his wife about his remote stroke,mild post stroke residual pareshesias and dizziness, risk for recurrent stroke/TIAs, personally independently reviewed imaging studies and stroke evaluation results and answered questions.Continue Plavix   for secondary stroke prevention and maintain strict control of hypertension with blood pressure goal below 130/90, diabetes with hemoglobin A1c goal below 6.5% and lipids with LDL cholesterol goal below 70 mg/dL. I also advised the patient to eat a healthy diet with plenty of whole grains, cereals, fruits and vegetables, exercise regularly and maintain ideal body weight .I counseled him to use his CPAP every night for sleep apnea.  I complemented him on quitting smoking and drinking alcohol.  He does have mild postop paresthesias which seem not disabling hence we will hold off on medications.  Continue meclizine as needed for his dizziness.  Check screening carotid ultrasound, transcranial Doppler, lipid profile and A1c.  Followup in the future with my nurse practitioner in 6 months or call earlier if necessary.  I spent 35 minutes of face-to-face and non-face-to-face time with patient and girlfriend.  This included previsit chart review, lab review, study review, electronic health record documentation, patient and girlfriend education regarding prior stroke with residual deficits, secondary stroke prevention measures and importance of managing stroke risk factors, and answered all other questions to patient and girlfriend's satisfaction  Delia Heady, MD  [facility] [address] [place], [place] [address]  Phone [phone] Fax [phone] Note: This document was prepared with digital dictation and possible smart phrase technology. Any transcriptional errors that result from this process are  unintentional.

## 2023-06-03 NOTE — Patient Instructions (Signed)
I had a long d/w patient and his wife about his remote stroke,mild post stroke residual pareshesias and dizziness, risk for recurrent stroke/TIAs, personally independently reviewed imaging studies and stroke evaluation results and answered questions.Continue Plavix   for secondary stroke prevention and maintain strict control of hypertension with blood pressure goal below 130/90, diabetes with hemoglobin A1c goal below 6.5% and lipids with LDL cholesterol goal below 70 mg/dL. I also advised the patient to eat a healthy diet with plenty of whole grains, cereals, fruits and vegetables, exercise regularly and maintain ideal body weight .I counseled him to use his CPAP every night for sleep apnea.  I complemented him on quitting smoking and drinking alcohol.  He does have mild postop paresthesias which seem not disabling hence we will hold off on medications.  Continue meclizine as needed for his dizziness.  Check screening carotid ultrasound, transcranial Doppler, lipid profile and A1c.  Followup in the future with my nurse practitioner in 6 months or call earlier if necessary.  Stroke Prevention Some medical conditions and behaviors can lead to a higher chance of having a stroke. You can help prevent a stroke by eating healthy, exercising, not smoking, and managing any medical conditions you have. Stroke is a leading cause of functional impairment. Primary prevention is particularly important because a majority of strokes are first-time events. Stroke changes the lives of not only those who experience a stroke but also their family and other caregivers. How can this condition affect me? A stroke is a medical emergency and should be treated right away. A stroke can lead to brain damage and can sometimes be life-threatening. If a person gets medical treatment right away, there is a better chance of surviving and recovering from a stroke. What can increase my risk? The following medical conditions may increase  your risk of a stroke: Cardiovascular disease. High blood pressure (hypertension). Diabetes. High cholesterol. Sickle cell disease. Blood clotting disorders (hypercoagulable state). Obesity. Sleep disorders (obstructive sleep apnea). Other risk factors include: Being older than age 47. Having a history of blood clots, stroke, or mini-stroke (transient ischemic attack, TIA). Genetic factors, such as race, ethnicity, or a family history of stroke. Smoking cigarettes or using other tobacco products. Taking birth control pills, especially if you also use tobacco. Heavy use of alcohol or drugs, especially cocaine and methamphetamine. Physical inactivity. What actions can I take to prevent this? Manage your health conditions High cholesterol levels. Eating a healthy diet is important for preventing high cholesterol. If cholesterol cannot be managed through diet alone, you may need to take medicines. Take any prescribed medicines to control your cholesterol as told by your health care provider. Hypertension. To reduce your risk of stroke, try to keep your blood pressure below 130/80. Eating a healthy diet and exercising regularly are important for controlling blood pressure. If these steps are not enough to manage your blood pressure, you may need to take medicines. Take any prescribed medicines to control hypertension as told by your health care provider. Ask your health care provider if you should monitor your blood pressure at home. Have your blood pressure checked every year, even if your blood pressure is normal. Blood pressure increases with age and some medical conditions. Diabetes. Eating a healthy diet and exercising regularly are important parts of managing your blood sugar (glucose). If your blood sugar cannot be managed through diet and exercise, you may need to take medicines. Take any prescribed medicines to control your diabetes as told by your health care  provider. Get  evaluated for obstructive sleep apnea. Talk to your health care provider about getting a sleep evaluation if you snore a lot or have excessive sleepiness. Make sure that any other medical conditions you have, such as atrial fibrillation or atherosclerosis, are managed. Nutrition Follow instructions from your health care provider about what to eat or drink to help manage your health condition. These instructions may include: Reducing your daily calorie intake. Limiting how much salt (sodium) you use to 1,500 milligrams (mg) each day. Using only healthy fats for cooking, such as olive oil, canola oil, or sunflower oil. Eating healthy foods. You can do this by: Choosing foods that are high in fiber, such as whole grains, and fresh fruits and vegetables. Eating at least 5 servings of fruits and vegetables a day. Try to fill one-half of your plate with fruits and vegetables at each meal. Choosing lean protein foods, such as lean cuts of meat, poultry without skin, fish, tofu, beans, and nuts. Eating low-fat dairy products. Avoiding foods that are high in sodium. This can help lower blood pressure. Avoiding foods that have saturated fat, trans fat, and cholesterol. This can help prevent high cholesterol. Avoiding processed and prepared foods. Counting your daily carbohydrate intake.  Lifestyle If you drink alcohol: Limit how much you have to: 0-1 drink a day for women who are not pregnant. 0-2 drinks a day for men. Know how much alcohol is in your drink. In the [place] one drink equals one 12 oz bottle of beer ( ), one 5 oz glass of wine ( ), or one 1 oz glass of hard liquor (44mL). Do not use any products that contain nicotine or tobacco. These products include cigarettes, chewing tobacco, and vaping devices, such as e-cigarettes. If you need help quitting, ask your health care provider. Avoid secondhand smoke. Do not use drugs. Activity  Try to stay at a healthy weight. Get at least  30 minutes of exercise on most days, such as: Fast walking. Biking. Swimming. Medicines Take over-the-counter and prescription medicines only as told by your health care provider. Aspirin or blood thinners (antiplatelets or anticoagulants) may be recommended to reduce your risk of forming blood clots that can lead to stroke. Avoid taking birth control pills. Talk to your health care provider about the risks of taking birth control pills if: You are over 20 years old. You smoke. You get very bad headaches. You have had a blood clot. Where to find more information American Stroke Association: www.strokeassociation.org Get help right away if: You or a loved one has any symptoms of a stroke. "BE FAST" is an easy way to remember the main warning signs of a stroke: B - Balance. Signs are dizziness, sudden trouble walking, or loss of balance. E - Eyes. Signs are trouble seeing or a sudden change in vision. F - Face. Signs are sudden weakness or numbness of the face, or the face or eyelid drooping on one side. A - Arms. Signs are weakness or numbness in an arm. This happens suddenly and usually on one side of the body. S - Speech. Signs are sudden trouble speaking, slurred speech, or trouble understanding what people say. T - Time. Time to call emergency services. Write down what time symptoms started. You or a loved one has other signs of a stroke, such as: A sudden, severe headache with no known cause. Nausea or vomiting. Seizure. These symptoms may represent a serious problem that is an emergency. Do not wait to see if the  symptoms will go away. Get medical help right away. Call your local emergency services (911 in the [place] Do not drive yourself to the hospital. Summary You can help to prevent a stroke by eating healthy, exercising, not smoking, limiting alcohol intake, and managing any medical conditions you may have. Do not use any products that contain nicotine or tobacco. These include  cigarettes, chewing tobacco, and vaping devices, such as e-cigarettes. If you need help quitting, ask your health care provider. Remember "BE FAST" for warning signs of a stroke. Get help right away if you or a loved one has any of these signs. This information is not intended to replace advice given to you by your health care provider. Make sure you discuss any questions you have with your health care provider. Document Revised: 06/22/2022 Document Reviewed: 06/22/2022 Elsevier Patient Education  2024 [employer].

## 2023-06-04 LAB — LIPID PANEL
Chol/HDL Ratio: 2.6 ratio (ref 0.0–5.0)
Cholesterol, Total: 113 mg/dL (ref 100–199)
HDL: 43 mg/dL (ref >39)
LDL Chol Calc (NIH): 52 mg/dL (ref 0–99)
Triglycerides: 94 mg/dL (ref 0–149)
VLDL Cholesterol Cal: 18 mg/dL (ref 5–40)

## 2023-06-04 LAB — HEMOGLOBIN A1C
Est. average glucose Bld gHb Est-mCnc: 103 mg/dL
Hgb A1c MFr Bld: 5.2 % (ref 4.8–5.6)

## 2023-06-06 NOTE — Progress Notes (Signed)
Kindly advise the patient that lab work for cholesterol and screening test for diabetes were both quite satisfactory

## 2023-06-07 ENCOUNTER — Telehealth: Payer: Self-pay | Admitting: Anesthesiology

## 2023-06-07 NOTE — Telephone Encounter (Signed)
-----   Message from Ernest [name] sent at 06/06/2023  3:47 PM EST ----- Kindly advise the patient that lab work for cholesterol and screening test for diabetes were both quite satisfactory

## 2023-06-07 NOTE — Telephone Encounter (Signed)
Atc pt no answer could not leave vmm, closing encounter. Will send mychart message

## 2023-06-22 ENCOUNTER — Ambulatory Visit (HOSPITAL_COMMUNITY)
Admission: RE | Admit: 2023-06-22 | Discharge: 2023-06-22 | Disposition: A | Discharge status: Home / Self Care | Payer: Medicaid Other | Source: Ambulatory Visit | Attending: Neurology | Admitting: Neurology

## 2023-06-22 DIAGNOSIS — Z8673 Personal history of transient ischemic attack (TIA), and cerebral infarction without residual deficits: Secondary | ICD-10-CM

## 2023-07-04 NOTE — Progress Notes (Signed)
Kindly inform the patient that transcranial Doppler bubble study shows normal flow velocities in all blood vessels in the brain.  Nothing to worry about

## 2023-07-04 NOTE — Progress Notes (Signed)
HPI M Smoker, from [place],  followed for OSA, complicated by CVA, Hx Seizure, Patent Foramen Ovale, OSA, Obesity, Hypocalcemia, Hyperlipidemia,  [facility] 11/09/19   AHI 18.9/ hr, desaturation to 77%,  ======================================================================    03/30/23-  37 yoM former smoker followed for OSA, complicated by CVA, Hx Seizure, Patent Foramen Ovale, OSA, Obesity, Hypocalcemia, Hyperlipidemia,  CPAP auto 5-15/ [employer] Download compliance-   0%, AHI1/ hr Body weight today- ED 8/26- dizziness, headache. -----OSA not using CPAP routinely He tried CPAP but did not like the mask "uncomfortable".  Wife is here as Nurse, learning disability.  We discussed goals and alternatives.  We can try changing to nasal pillows but we are also going to refer him to explore oral appliance.  07/06/23- 37 yoM former smoker followed for OSA, complicated by CVA, Hx Seizure, Patent Foramen Ovale, OSA, Obesity, Hypocalcemia, Hyperlipidemia,  CPAP auto 5-15/ ? [employer]- we are calling to verify DME Download compliance-    Body weight today-204 lbs -----Patient feels chest pressure/pain when he lies down at night x 2 weeks.  2 episodes in past 2 weeks of left/midline anterior chest discomfort only after he has just lain down. Self-limited without radiation, palpitation, diaphoresis or burping. Relieved by sitting up for a few minutes, then can lie down and go to sleep. Discussed trying Rx for acid reflux to see if that ill help. Discussed refitting CPAP mask, and also compliance goals.  ROS-see HPI  + = positive Constitutional:    weight loss, night sweats, fevers, chills, fatigue, lassitude. HEENT:    headaches, difficulty swallowing, tooth/dental problems, sore throat,       sneezing, itching, ear ache, nasal congestion, post nasal drip, snoring CV:    chest pain, orthopnea, PND, swelling in lower extremities, anasarca,                                   dizziness, palpitations Resp:   +shortness of  breath with exertion or at rest.                productive cough,   non-productive cough, coughing up of blood.              change in color of mucus.  wheezing.   Skin:    rash or lesions. GI:  No-   heartburn, indigestion, abdominal pain, nausea, vomiting, diarrhea,                 change in bowel habits, loss of appetite GU: dysuria, change in color of urine, no urgency or frequency.   flank pain. MS:   joint pain, stiffness, decreased range of motion, back pain. Neuro-    L hand numb on waking in AM-? Impingement, + dizziness Psych:  change in mood or affect.  depression or anxiety.   memory loss.  OBJ- Physical Exam General- Alert, Oriented, Affect-appropriate, Distress- none acute, + obese Skin- rash-none, lesions- none, excoriation- none Lymphadenopathy- none Head- atraumatic            Eyes- Gross vision intact, PERRLA, conjunctivae and secretions clear            Ears- Hearing, canals-normal            Nose- Clear, no-Septal dev, mucus, polyps, erosion, perforation             Throat- Mallampati III-IV , mucosa clear , drainage- none, tonsils+, teeth+ Neck- flexible , trachea midline, no  stridor , thyroid nl, carotid no bruit Chest - symmetrical excursion , unlabored           Heart/CV- RRR , +no murmur heard, no gallop  , no rub, nl s1 s2                           - JVD- none , edema- none, stasis changes- none, varices- none           Lung- + clear, wheeze- none, cough- none , dullness-none, rub- none           Chest wall-  Abd-  Br/ Gen/ Rectal- Not done, not indicated Extrem- cyanosis- none, clubbing, none, atrophy- none, strength- nl Neuro- grossly intact to observation

## 2023-07-04 NOTE — Progress Notes (Signed)
Currently inform the patient that carotid ultrasound study shows no major blockages of either carotid artery with only minimal thickening.  Nothing to worry about

## 2023-07-05 ENCOUNTER — Telehealth: Payer: Self-pay

## 2023-07-05 NOTE — Telephone Encounter (Addendum)
-----   Message from Delia Heady sent at 07/04/2023  5:07 PM EST ----- Currently inform the patient that carotid ultrasound study shows no major blockages of either carotid artery with only minimal thickening.  Nothing to worry about   Micki Riley, MD  P Gna-Pod 3 Results Kindly inform the patient that transcranial Doppler bubble study shows normal flow velocities in all blood vessels in the brain.  Nothing to worry about

## 2023-07-05 NOTE — Telephone Encounter (Signed)
Contacted pt/spouse with assistance of language line regarding Carotid US/Transcranial doppler. LVM rq call back.

## 2023-07-06 ENCOUNTER — Ambulatory Visit: Payer: Medicaid Other | Admitting: Internal Medicine

## 2023-07-06 ENCOUNTER — Encounter: Payer: Self-pay | Admitting: Internal Medicine

## 2023-07-06 ENCOUNTER — Ambulatory Visit: Payer: Medicaid Other

## 2023-07-06 VITALS — BP 120/68 | HR 60 | Temp 98.0°F | Ht 63.0 in | Wt 204.4 lb

## 2023-07-06 DIAGNOSIS — R0789 Other chest pain: Secondary | ICD-10-CM

## 2023-07-06 DIAGNOSIS — G4733 Obstructive sleep apnea (adult) (pediatric): Secondary | ICD-10-CM

## 2023-07-06 NOTE — Patient Instructions (Signed)
We will try to confirm that [employer] [name] is the DME company managing your CPAP. If not [employer], then we will check with [employer] and [employer] to see who has your account.  We will ask the DME company to continue CPAP for now, at autopap 5-15. And Order- face to face visit to refit CPAP mask for comfort and seal.  Order- CXR     dx Left parasternal chst pain when lying down  Suggest- try otc TUMS tablet for acid heartburn- chew one tablet if you feel the chest pain.

## 2023-08-20 ENCOUNTER — Telehealth: Payer: Self-pay | Admitting: Neurology

## 2023-08-20 NOTE — Telephone Encounter (Signed)
Confirm appointment details

## 2023-08-21 ENCOUNTER — Encounter: Payer: Self-pay | Admitting: Internal Medicine

## 2023-08-21 DIAGNOSIS — R0789 Other chest pain: Secondary | ICD-10-CM | POA: Insufficient documentation

## 2023-08-21 NOTE — Assessment & Plan Note (Signed)
Benefit from CPAP. Needs face to face at DME for mask refitting- language barrier. Possibly aerophagia.

## 2023-08-21 NOTE — Assessment & Plan Note (Signed)
Not clear what he is describing but suspect either reflux or aerophagia Plan- TUMS, CXR

## 2023-09-25 ENCOUNTER — Other Ambulatory Visit: Payer: Self-pay | Admitting: Family

## 2023-09-25 DIAGNOSIS — Z8673 Personal history of transient ischemic attack (TIA), and cerebral infarction without residual deficits: Secondary | ICD-10-CM

## 2023-10-02 NOTE — Progress Notes (Unsigned)
 HPI M Smoker, from [place],  followed for OSA, complicated by CVA, Hx Seizure, Patent Foramen Ovale, OSA, Obesity, Hypocalcemia, Hyperlipidemia,  [facility] 11/09/19   AHI 18.9/ hr, desaturation to 77%,  ======================================================================  07/06/23- 37 yoM former smoker followed for OSA, complicated by CVA, Hx Seizure, Patent Foramen Ovale, OSA, Obesity, Hypocalcemia, Hyperlipidemia,  CPAP auto 5-15/ ? [employer]- we are calling to verify DME Download compliance-    Body weight today-204 lbs -----Patient feels chest pressure/pain when he lies down at night x 2 weeks.  2 episodes in past 2 weeks of left/midline anterior chest discomfort only after he has just lain down. Self-limited without radiation, palpitation, diaphoresis or burping. Relieved by sitting up for a few minutes, then can lie down and go to sleep. Discussed trying Rx for acid reflux to see if that ill help. Discussed refitting CPAP mask, and also compliance goals.  10/04/23-  37 yoM former smoker followed for OSA, complicated by CVA, Hx Seizure, Patent Foramen Ovale, OSA, Obesity, Hypocalcemia, Hyperlipidemia,  CPAP auto 5-15/ ? [employer]- we are calling to verify DME Download compliance-    Body weight today-  CXR 07/05/24- IMPRESSION: No active cardiopulmonary disease.  No explanation for symptoms.    ROS-see HPI  + = positive Constitutional:    weight loss, night sweats, fevers, chills, fatigue, lassitude. HEENT:    headaches, difficulty swallowing, tooth/dental problems, sore throat,       sneezing, itching, ear ache, nasal congestion, post nasal drip, snoring CV:    chest pain, orthopnea, PND, swelling in lower extremities, anasarca,                                   dizziness, palpitations Resp:   +shortness of breath with exertion or at rest.                productive cough,   non-productive cough, coughing up of blood.              change in color of mucus.  wheezing.   Skin:    rash or  lesions. GI:  No-   heartburn, indigestion, abdominal pain, nausea, vomiting, diarrhea,                 change in bowel habits, loss of appetite GU: dysuria, change in color of urine, no urgency or frequency.   flank pain. MS:   joint pain, stiffness, decreased range of motion, back pain. Neuro-    L hand numb on waking in AM-? Impingement, + dizziness Psych:  change in mood or affect.  depression or anxiety.   memory loss.  OBJ- Physical Exam General- Alert, Oriented, Affect-appropriate, Distress- none acute, + obese Skin- rash-none, lesions- none, excoriation- none Lymphadenopathy- none Head- atraumatic            Eyes- Gross vision intact, PERRLA, conjunctivae and secretions clear            Ears- Hearing, canals-normal            Nose- Clear, no-Septal dev, mucus, polyps, erosion, perforation             Throat- Mallampati III-IV , mucosa clear , drainage- none, tonsils+, teeth+ Neck- flexible , trachea midline, no stridor , thyroid nl, carotid no bruit Chest - symmetrical excursion , unlabored           Heart/CV- RRR , +no murmur heard, no gallop  ,  no rub, nl s1 s2                           - JVD- none , edema- none, stasis changes- none, varices- none           Lung- + clear, wheeze- none, cough- none , dullness-none, rub- none           Chest wall-  Abd-  Br/ Gen/ Rectal- Not done, not indicated Extrem- cyanosis- none, clubbing, none, atrophy- none, strength- nl Neuro- grossly intact to observation

## 2023-10-04 ENCOUNTER — Ambulatory Visit: Payer: Medicaid Other | Admitting: Internal Medicine

## 2023-10-04 ENCOUNTER — Encounter: Payer: Self-pay | Admitting: Internal Medicine

## 2023-10-04 VITALS — BP 122/78 | HR 81 | Temp 98.3°F | Ht 63.0 in | Wt 208.8 lb

## 2023-10-04 DIAGNOSIS — G4733 Obstructive sleep apnea (adult) (pediatric): Secondary | ICD-10-CM | POA: Diagnosis not present

## 2023-10-04 NOTE — Patient Instructions (Signed)
 Order- DME [employer]- please change autopap pressure range to 5-10, refit mask for comfort- consider nasal mask with chin strap.

## 2023-12-09 ENCOUNTER — Encounter: Payer: Self-pay | Admitting: Adult Health

## 2023-12-09 ENCOUNTER — Ambulatory Visit (INDEPENDENT_AMBULATORY_CARE_PROVIDER_SITE_OTHER): Payer: Medicaid Other | Admitting: Adult Health

## 2023-12-09 VITALS — BP 143/89 | HR 68 | Ht 63.0 in | Wt 210.0 lb

## 2023-12-09 DIAGNOSIS — Z8673 Personal history of transient ischemic attack (TIA), and cerebral infarction without residual deficits: Secondary | ICD-10-CM

## 2023-12-09 DIAGNOSIS — R200 Anesthesia of skin: Secondary | ICD-10-CM

## 2023-12-09 DIAGNOSIS — R42 Dizziness and giddiness: Secondary | ICD-10-CM | POA: Diagnosis not present

## 2023-12-09 NOTE — Progress Notes (Signed)
 [facility] [address] [place]. [place] [address] [phone]       STROKE FOLLOW UP NOTE  [name] Date of Birth:  November 18, 1985 Medical Record Number:  [id number]   Primary neurologist: Dr. Janett [name] Reason for visit: Stroke follow-up   SUBJECTIVE:   CHIEF COMPLAINT:  Chief Complaint  Patient presents with   Transient Ischemic Attack    Rm 3 alone Pt is well, reports he is still having L hand numbness and speech impairment. Otherwise he is stable, no other concerns     HPI:    Update 12/09/2023 JM: Patient returns for follow-up visit after prior visit with Dr. Janett [name] 6 months ago.  He is unaccompanied today.  Declined interpreter services.  Overall stable without new stroke/TIA symptoms.  Reports continued left hand/arm numbness upon awakening and gradually improves (from shoulder down to fingertips) and speech difficulty (slurred, occasionally), unchanged since prior visit  Continues to have issues with dizziness, takes meclizine  daily which helps. Remains on Plavix  and atorvastatin  without side effects.  Routinely follows with PCP for stroke risk factor management.  Continues to follow with pulmonology for OSA, continues to have difficulty tolerating, has f/u with pulmonology next month to further discuss. No further questions or concerns at this time.     History provided for reference purposes only Update 06/03/2023 Dr. Janett [name]: Patient was referred back for stroke follow-up today by primary care physician as he was lost to follow-up and had no insurance.  He is accompanied by his girlfriend today.  He states he has had no recurrent stroke or TIA symptoms.  He still has some residual left hand numbness which is notices mostly at night.  This is not bothersome.  He also complains of intermittent dizziness.  This infarct got worse and he was seen in the ER on 03/29/2023 and had MRI of the brain which showed no acute abnormality.  He has been prescribed  meclizine  which she takes as needed and finds it helpful.  Patient continues to take Plavix  which is tolerating well without bruising or bleeding.  He had stopped taking Lipitor  which she has now since restarted.  He does have obstructive sleep apnea and does snore.  He had some problem with the CPAP mask and has not been using it and has asked for replacement which has not arrived yet.  He has applied for long-term disability and has an appointment with Social Security later this week.  He has quit smoking cigarettes and drinking alcohol completely.  He has no new complaints.   Update 11/11/2021 JM: 38 year old male with right BG/CR infarct in 05/2021, returns for 57-month follow-up visit.  He is accompanied by his girlfriend who assists with interpretation.  Reports persistent symptoms as discussed at prior visit including intermittent dizziness (only with ambulation), gait impairment, left hand weakness and dysarthria.  GF also mentions some difficulty with short-term memory loss.  He did have a fall back in March due to losing his balance, was evaluated by EMS and as stable, did not proceed to ED.  He has not had any additional falls since that time. Will use cane for long distance. Reports no benefit with use of meclizine  currently using 12.5 mg tablet daily (rx'd TID), denies any side effects - he questions increasing dosage.  Still has not been able to participate in any therapies as he remains uninsured, per GF, was denied by Medicaid. GF continues to work with him on exercises. 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  Denies new stroke/TIA symptoms.  Compliant on Plavix  and atorvastatin , denies side effects.  Blood pressure today 116/82.  He has since been seen by PCP.  He was evaluated by cardiology who agreed with Dr. Janett [name] regarding no indication for PFO closure.  Completed cardiac monitor which was negative for atrial fibrillation.  No  further concerns at this time.  Initial visit 07/10/2021 JM: Being seen for initial hospital follow-up accompanied by his girlfriend who assists with interpretation.  Interpreter waiver signed.  C/o blurred vision - seen by eye doctor and currently waiting for rx glasses  C/o dizziness after walking for a few minutes.describes this as room spinning sensation.  Will feel off balance at times Left arm weakness - some improvement but does feel increased tightness at night Occasional slurring  No new stroke/TIA symptoms  He has not participated in any type of therapies.  He is currently not working -previously working as a Estate agent.  Requesting FMLA paperwork to be completed.  He is currently applying for disability and Medicaid. He and g/f are concerned regarding cost of doing therapy at this point.   Compliant on Plavix  and atorvastatin  80 mg daily -denies side effects Blood pressure today 140/91 -does not routinely monitor at home Appointment pulmonology 08/05/2021 for CPAP  No further concerns at this time   Stroke admission 05/22/2021 Ernest [name] is a 38 y.o. 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  He presented to the ED on 05/22/2021 after awakening around 0130 with left sided weakness, left sided facial droop and dysphasia.  Symptoms resolved spontaneously, but patient and significant other state that they come and go.  Personally reviewed hospitalization pertinent progress notes, lab work and imaging.  Evaluated by Dr. 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  CTA performed reveals narrowing of the right distal M1 segment of the MCA.  EEG performed was negative for epileptiform discharges.  EF 60 to 65%.  LDL 120.  A1c 5.5.  Recommended DAPT for 3 months due to intracranial stenosis and aspirin  alone as well as  initiated atorvastatin  80 mg daily.  Smoking cessation counseling provided.  PT/OT no therapy needs and discharged home on 05/23/2021.  He returned to ED on 05/24/2021 for worsening confusion, slurred speech, left facial weakness and left hand numbness.  Repeat MRI no significant change from recent imaging or evidence of new stroke.  Evaluated by Dr. Janett [name] who felt recent infarct likely cryptogenic given absence of significant arthrosclerosis elsewhere.  LE Doppler negative for DVT.  TCD bubble negative for PFO.  TEE small PFO, no evidence of thrombus.  ANA and anticardiolipin antibodies negative. Known OSA dx'd over 1 year ago at [facility] but never started CPAP - he was advised to follow back up with them to obtain CPAP.  Recommended continuation of DAPT and statin and discharged home on 05/28/2021     PERTINENT IMAGING  Per hospitalization 05/22/2021 Code Stroke CT head No acute abnormality. ASPECTS 10.  CTA head & neck narrowing and irregularity of the right distal M1 segment of the MCA MRI brain done shows 3.4 cm acute infarct within the right basal ganglia and right corona radiata. MRA head shows moderate stenosis within the distal M1 right middle cerebral artery. No M2 proximal branch occlusion or  high-grade proximal stenosis is identified 2D Echo EF 60 to 65%  Per hospitalization 05/24/2021 CTH subacute appearing right basal ganglia and corona radiata infarction MR Brain right BG infarct with no significant regional extension, no new intracranial abnormality 2D Echo EF 60-65%. No atrial shunt TCD no evidence of PFO ANA and anticardiolipin antibodies negative TEE EF 60 to 65%, no evidence of thrombus, evidence of small PFO lower extremities negative for DVT  EEG no seizure activity UDS negative      ROS:   14 system review of systems performed and negative with exception of those listed in HPI  PMH:  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HCC)     Left-sided weakness 06/17/2021   Stroke (HCC)     PSH:  Past Surgical History:  Procedure Laterality Date   BUBBLE STUDY  05/28/2021   Procedure: BUBBLE STUDY;  Surgeon: Maudine Sos, MD;  Location: [facility] ENDOSCOPY;  Service: Cardiovascular;;   DECOMPRESSION FASCIOTOMY FOREARM Right    TEE WITHOUT CARDIOVERSION N/A 05/28/2021   Procedure: TRANSESOPHAGEAL ECHOCARDIOGRAM (TEE);  Surgeon: Maudine Sos, MD;  Location: [facility] ENDOSCOPY;  Service: Cardiovascular;  Laterality: N/A;    Social History:  Social History   Socioeconomic History   Marital status: Significant Other    Spouse name: Not on file   Number of children: Not on file   Years of education: Not on file   Highest education level: Not on file  Occupational History   Not on file  Tobacco Use   Smoking status: Former    Current packs/day: 0.00    Average packs/day: (0.2 ttl pk-yrs)    Types: Cigarettes    Start date: 08/03/2005    Quit date: 05/03/2021    Years since quitting: 2.6   Smokeless tobacco: Never  Vaping Use   Vaping status: Never Used  Substance and Sexual Activity   Alcohol use: Never   Drug use: Never   Sexual activity: Not on file  Other Topics Concern   Not on file  Social History Narrative   Not on file   Social Drivers of Health   Financial Resource Strain: Low Risk  (08/20/2023)   Received from [facility]   Overall Financial Resource Strain (CARDIA)    Difficulty of Paying Living Expenses: Not hard at all  Food Insecurity: No Food Insecurity (08/20/2023)   Received from [facility]   Hunger Vital Sign    Worried About Running Out of Food in the Last Year: Never true    Ran Out of Food in the Last Year: Never true  Transportation Needs: No Transportation Needs (08/20/2023)   Received from [facility] - [facility]    Lack of Transportation (Medical): No    Lack of Transportation (Non-Medical): No  Physical Activity: Inactive (07/23/2019)   Received from [facility] visits prior to 10/03/2022., [facility] visits prior to 10/03/2022.   Exercise Vital Sign    Days of Exercise per Week: 0 days    Minutes of Exercise per Session: 0 min  Stress: Stress Concern Present (07/23/2019)   Received from [facility] visits prior to 10/03/2022., [facility] visits prior to 10/03/2022.   [employer] of Occupational Health - Occupational Stress Questionnaire    Feeling of Stress : Rather much  Social Connections: Unknown (11/12/2022)   Received from [facility], [facility]   Social Network  Social Network: Not on file  Intimate Partner Violence: Unknown (11/12/2022)   Received from [facility], [facility]   HITS    Physically Hurt: Not on file    Insult or Talk Down To: Not on file    Threaten Physical Harm: Not on file    Scream or Curse: Not on file    Family History:  Family History  Problem Relation Age of Onset   Diabetes Neg Hx    Cancer Neg Hx    Heart failure Neg Hx    Hyperlipidemia Neg Hx    Hypertension Neg Hx     Medications:   Current Outpatient Medications on File Prior to Visit  Medication Sig Dispense Refill   acetaminophen  (TYLENOL ) 325 MG tablet Take 650 mg by mouth every 6 (six) hours as needed for mild pain (pain score 1-3).     atorvastatin  (LIPITOR ) 80 MG tablet TAKE 1 TABLET(80 MG) BY MOUTH DAILY 90 tablet 1   clopidogrel  (PLAVIX ) 75 MG tablet TAKE 1 TABLET(75 MG) BY MOUTH DAILY 90 tablet 0   escitalopram (LEXAPRO) 10 MG tablet Take 10 mg by mouth daily.     gabapentin (NEURONTIN) 300 MG capsule Take 300 mg by mouth daily.     meclizine  (ANTIVERT ) 25 MG tablet Take 1 tablet (25 mg total) by mouth 3 (three) times daily as needed for dizziness. 30 tablet 0   oxybutynin  (DITROPAN -XL) 5 MG 24 hr tablet Take 1 tablet (5 mg total) by mouth at bedtime. 30 tablet 0   No current facility-administered medications on file prior to visit.     Allergies:  No Known Allergies    OBJECTIVE:  Physical Exam  Vitals:   12/09/23 1518  BP: (!) 143/89  Pulse: 68  Weight: 210 lb (95.3 kg)  Height: 5\' 3"  (1.6 m)    Body mass index is 37.2 kg/m. No results found.  General: well developed, well nourished, pleasant middle-age male, seated, in no evident distress Head: head normocephalic and atraumatic.   Neck: supple with no carotid or supraclavicular bruits Cardiovascular: regular rate and rhythm, no murmurs Musculoskeletal: no deformity Skin:  no rash/petichiae Vascular:  Normal pulses all extremities   Neurologic Exam Mental Status: Awake and fully alert.  Possible slight slurred speech but difficulty fully evaluating due to language barrier.  Oriented to place and time. Recent and remote memory intact. Attention span, concentration and fund of knowledge appropriate during visit. Mood and affect flat.  Cranial Nerves: Pupils equal, briskly reactive to light. Extraocular movements full without nystagmus. Visual fields full to confrontation. Hearing intact. Facial sensation intact.  Mild left lower facial weakness. tongue, palate moves normally and symmetrically.  Motor: Normal bulk and tone. Normal strength in all tested extremity muscles except mild LUE weakness greater distally although difficulty fully testing due to giveaway weakness Sensory.: intact to touch , pinprick , position and vibratory sensation.  Coordination: Rapid alternating movements normal in all extremities except slightly decreased left hand. Finger-to-nose and heel-to-shin performed accurately bilaterally. Mildly orbits right arm over left arm Gait and Station: Arises from chair without difficulty, able to stand from seated position with arms crossed. Stance is normal. Gait demonstrates normal stride length and step height without use of AD. Reflexes: 1+ and symmetric. Toes downgoing.          ASSESSMENT: Ernest [name] is a 38 y.o. year old male  with recent right BG/CR infarct on 05/22/2021 initially felt to be due to large vessel disease with distal right  M1 stenosis per Dr. Christiane Cowing but represented on 05/24/2021 with worsening symptoms without evidence of new stroke or extension of prior stroke - eval by Dr. Janett [name] who felt more likely etiology cryptogenic given absence of significant arthrosclerosis elsewhere. Vascular risk factors include HLD, tobacco use, obesity and OSA     PLAN:  Right BG/CR infarct:  Residual deficit: mild LUE weakness, facial weakness and dizziness/vertigo.  Overall stable from prior visit.  Continue meclizine  as needed for dizziness -request ongoing refills/management by PCP Continue clopidogrel  75 mg daily  and atorvastatin  80 mg daily for secondary stroke prevention managed/prescribed by PCP.   Discussed secondary stroke prevention measures and importance of close PCP follow up for aggressive stroke risk factor management including HLD with LDL goal<70.  Stroke labs 05/2023: LDL 48, A1c 5.2 I have gone over the pathophysiology of stroke, warning signs and symptoms, risk factors and their management in some detail with instructions to go to the closest emergency room for symptoms of concern. PFO: as evidenced on TEE but no evidence on TCD therefore unlikely to be clinically significant OSA: continue to follow with pulmonology, continued difficulty tolerating, discussed importance of nightly usage and ensure follow up with pulmonology to discuss ongoing difficulty and further recommendations     No further recommendations from stroke/neurological standpoint and is routinely followed by PCP for stroke risk factor management.  Patient can follow-up on an as-needed basis but advised to call with any stroke related questions or concerns in the future.   CC:  PCP: Versa Gore, NP    I spent 25 minutes of face-to-face and non-face-to-face time with patient.  This included previsit chart review, lab review, study  review, order entry, electronic health record documentation, patient education and discussion regarding above diagnoses and treatment plan and answered all other questions to patient's satisfaction   Johny Nap, [facility]  [facility] [address] [place], [place] [address]  Phone [phone] Fax [phone] Note: This document was prepared with digital dictation and possible smart phrase technology. Any transcriptional errors that result from this process are unintentional.

## 2023-12-09 NOTE — Patient Instructions (Addendum)
 Continue clopidogrel  75 mg daily  and atorvastatin  for secondary stroke prevention  Continue to follow with pulmonology for sleep apnea management next month as scheduled  Continue meclizine  as needed, ongoing refills can be obtained by your PCP  Continue to follow up with PCP regarding blood pressure and cholesterol management  Maintain strict control of hypertension with blood pressure goal below 130/90 and cholesterol with LDL cholesterol (bad cholesterol) goal below 70 mg/dL.   Signs of a Stroke? Follow the BEFAST method:  Balance Watch for a sudden loss of balance, trouble with coordination or vertigo Eyes Is there a sudden loss of vision in one or both eyes? Or double vision?  Face: Ask the person to smile. Does one side of the face droop or is it numb?  Arms: Ask the person to raise both arms. Does one arm drift downward? Is there weakness or numbness of a leg? Speech: Ask the person to repeat a simple phrase. Does the speech sound slurred/strange? Is the person confused ? Time: If you observe any of these signs, call 911.        Thank you for coming to see us  at [facility]. I hope we have been able to provide you high quality care today.  You may receive a patient satisfaction survey over the next few weeks. We would appreciate your feedback and comments so that we may continue to improve ourselves and the health of our patients.

## 2023-12-09 NOTE — Progress Notes (Signed)
 PT has signed interpreter services waiver and does not wish to have one for future visit.

## 2024-01-04 ENCOUNTER — Ambulatory Visit: Admitting: Internal Medicine

## 2024-02-20 NOTE — Progress Notes (Deleted)
 HPI M Smoker, from [place],  followed for OSA, complicated by CVA, Hx Seizure, Patent Foramen Ovale, OSA, Obesity, Hypocalcemia, Hyperlipidemia,  [facility] 11/09/19   AHI 18.9/ hr, desaturation to 77%,  ======================================================================  10/04/23-  37 yoM former smoker followed for OSA, complicated by CVA, Hx Seizure, Patent Foramen Ovale, OSA, Obesity, Hypocalcemia, Hyperlipidemia,  CPAP auto 5-15/  [facility]- w Download compliance-   used only 2 weeks mid-December with no record after 08/07/23  AHI 7.3/hr Body weight today- CXR 07/05/24- IMPRESSION: No active cardiopulmonary disease.  No explanation for symptoms. -----Unable to use CPAP.  Nose mask makes him feel like he cannot breathe.  Is only using CPAP very infrequently. Discussed the use of AI scribe software for clinical note transcription with the patient, who gave verbal consent to proceed. History of Present Illness   The patient, with a history of sleep apnea, presents with discomfort and difficulty tolerating his CPAP machine. He describes the sensation as 'something stuck' in his throat and excessive noise. The pressure feels too high, causing discomfort, and despite using the machine, he continues to snore. He has tried different masks, including a full face mask and nasal pillows, but neither has provided relief. The patient also mentions a sensation of too much air pressure in his ears. Despite these issues, he reports his chest feels 'pretty good.' We will try reducing pressure and changing from nasal pillows.   02/22/24- -  38 yoM former smoker followed for OSA, complicated by CVA, Hx Seizure, Patent Foramen Ovale, OSA, Obesity, Hypocalcemia, Hyperlipidemia,  CPAP auto 5-15/  [facility]- w Download compliance-   used only 2 weeks mid-December with no record after 08/07/23  AHI 7.3/hr Body weight today- Scheduled in August for nasal polyp surgery- [facility].     ROS-see HPI  + =  positive Constitutional:    weight loss, night sweats, fevers, chills, fatigue, lassitude. HEENT:    headaches, difficulty swallowing, tooth/dental problems, sore throat,       sneezing, itching, ear ache, nasal congestion, post nasal drip, snoring CV:    chest pain, orthopnea, PND, swelling in lower extremities, anasarca,                                   dizziness, palpitations Resp:   +shortness of breath with exertion or at rest.                productive cough,   non-productive cough, coughing up of blood.              change in color of mucus.  wheezing.   Skin:    rash or lesions. GI:  No-   heartburn, indigestion, abdominal pain, nausea, vomiting, diarrhea,                 change in bowel habits, loss of appetite GU: dysuria, change in color of urine, no urgency or frequency.   flank pain. MS:   joint pain, stiffness, decreased range of motion, back pain. Neuro-    L hand numb on waking in AM-? Impingement, + dizziness Psych:  change in mood or affect.  depression or anxiety.   memory loss.  OBJ- Physical Exam General- Alert, Oriented, Affect-appropriate, Distress- none acute, + obese Skin- rash-none, lesions- none, excoriation- none Lymphadenopathy- none Head- atraumatic            Eyes- Gross vision intact, PERRLA, conjunctivae and secretions clear  Ears- Hearing, canals-normal            Nose- Clear, no-Septal dev, mucus, polyps, erosion, perforation             Throat- Mallampati III-IV , mucosa clear , drainage- none, tonsils+, teeth+ Neck- flexible , trachea midline, no stridor , thyroid nl, carotid no bruit Chest - symmetrical excursion , unlabored           Heart/CV- RRR , +no murmur heard, no gallop  , no rub, nl s1 s2                           - JVD- none , edema- none, stasis changes- none, varices- none           Lung- + clear, wheeze- none, cough- none , dullness-none, rub- none           Chest wall-  Abd-  Br/ Gen/ Rectal- Not done, not  indicated Extrem- +significant scar forearm Neuro- grossly intact to observation Assessment and Plan    Obstructive Sleep Apnea He experienced discomfort with CPAP due to excessive air pressure and noise, leading to snoring and sleep discomfort. Current settings range from 5 to 15 cm H2O, with discomfort noted between 7 and 11 cm H2O. Nasal pillows mask caused throat and mouth discomfort. - Adjust CPAP pressure settings to a maximum of 10 cm H2O. - Request [facility] to refit him with a nasal mask covering only the nose. - Provide a chin strap to maintain mouth closure during sleep.

## 2024-02-22 ENCOUNTER — Ambulatory Visit: Admitting: Internal Medicine

## 2024-02-22 ENCOUNTER — Encounter: Payer: Self-pay | Admitting: Internal Medicine

## 2024-03-14 ENCOUNTER — Encounter (HOSPITAL_COMMUNITY): Payer: Self-pay | Admitting: Otolaryngology

## 2024-03-14 NOTE — Anesthesia Preprocedure Evaluation (Signed)
 Anesthesia Evaluation    Reviewed: Allergy & Precautions, Patient's Chart, lab work & pertinent test results  History of Anesthesia Complications Negative for: history of anesthetic complications  Airway        Dental   Pulmonary sleep apnea , Current Smoker          Cardiovascular negative cardio ROS      Neuro/Psych CVA  negative psych ROS   GI/Hepatic negative GI ROS, Neg liver ROS,,,  Endo/Other    Class 3 obesity  Renal/GU negative Renal ROS     Musculoskeletal negative musculoskeletal ROS (+)    Abdominal   Peds  Hematology  On plavix     Anesthesia Other Findings   Reproductive/Obstetrics                              Anesthesia Physical Anesthesia Plan  ASA: 3  Anesthesia Plan: General   Post-op Pain Management: Tylenol  PO (pre-op)*   Induction: Intravenous  PONV Risk Score and Plan: 1 and Treatment may vary due to age or medical condition, Ondansetron, Dexamethasone  and Midazolam  Airway Management Planned: Oral ETT  Additional Equipment: None  Intra-op Plan:   Post-operative Plan: Extubation in OR  Informed Consent:   Plan Discussed with: CRNA and Anesthesiologist  Anesthesia Plan Comments: (Per notes, will likely be rescheduled due to inadequate discontinuation of plavix  )        Anesthesia Quick Evaluation

## 2024-03-14 NOTE — Progress Notes (Signed)
 Anesthesia Chart Review: Ernest [name]  Case: [id number] Date/Time: 03/15/24 0930   Procedures:      SINUS SURGERY, ENDOSCOPIC (Bilateral)     MAXILLARY ANTROSTOMY (Bilateral)   Anesthesia type: General   Diagnosis: Nasal polyposis [J33.9]   Pre-op diagnosis: Nasal polyposis   [facility]: [facility] ROOM 11 / [facility]   Surgeons: Maggie Hussar, MD       DISCUSSION: Patient is a 38 year old male scheduled for the above procedure.  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  Last neurology follow-up was on 12/09/23 with Whitfield Raisin, NP. Overall stable. On Plavix  and atorvastatin  for stroke prophylaxis. Okay to continue follow-up with PCP.  Last Plavix  was reported as 03/13/24. Dr. Vandegriend had intended for him to hold for 5 days prior to surgery. Reportedly, plan is for case to be rescheduled.     He speaks [place].   VS:  Wt Readings from Last 3 Encounters:  12/09/23 95.3 kg  10/04/23 94.7 kg  07/06/23 92.7 kg   BP Readings from Last 3 Encounters:  12/09/23 (!) 143/89  10/04/23 122/78  07/06/23 120/68   Pulse Readings from Last 3 Encounters:  12/09/23 68  10/04/23 81  07/06/23 60     PROVIDERS: Lucius Krabbe, NP is PCP  Rosemarie Rothman, MD is neurologist Neysa Rama, MD is pulmonologist (OSA)   LABS: For day of procedure as indicated. Most recents results in [facility] include: Lab Results  Component Value Date   WBC 8.5 03/29/2023   HGB 15.0 03/29/2023   HCT 44.0  03/29/2023   PLT 245 03/29/2023   GLUCOSE 148 (H) 03/29/2023   CHOL 113 06/03/2023   TRIG 94 06/03/2023   HDL 43 06/03/2023   LDLCALC 52 06/03/2023   ALT 41 03/29/2023   AST 41 03/29/2023   NA 141 03/29/2023   K 3.8 03/29/2023   CL 107 03/29/2023   CREATININE 0.80 03/29/2023   BUN 7 03/29/2023   CO2 21 (L) 03/29/2023   TSH 0.687 05/22/2021   INR 1.0 03/29/2023   HGBA1C 5.2 06/03/2023     IMAGES: CT Facial Bones 01/04/24 ([facility]): IMPRESSION: 1. Pansinusitis with suspicion of underlying sinonasal polyposis.  Combination of acute sinus bubbly opacity plus chronic sinus  opacification with some periosteal thickening.  2. Chronic right middle ear and mastoid opacification also, with  stable underlying adenoid hypertrophy.   CXR 07/06/23: FINDINGS: The cardiomediastinal contours are normal. The lungs are clear. Pulmonary vasculature is normal. No consolidation, pleural effusion, or pneumothorax. No acute osseous abnormalities are seen. IMPRESSION: No active cardiopulmonary disease.  No explanation for symptoms.  MRI Brain 03/29/23: IMPRESSION: 1. No acute intracranial abnormality. 2. Multifocal hyperintense T2-weighted signal within the white matter, nonspecific but most commonly chronic small vessel ischemia. 3. Moderate paranasal sinus disease.    EKG: 03/29/23: NSR   CV: US  Carotid 06/22/23: Per Dr. Bucky review, Carotid ultrasound study shows no major blockages of either carotid artery with only minimal thickening.    Transcranial Doppler 06/22/23: Summary:  This was a normal transcranial Doppler study, with  normal flow direction  and velocity of all identified vessels of the anterior and posterior  circulations, with no evidence of stenosis, vasospasm or occlusion. There  was no evidence of intracranial  disease.    Long term monitor 09/14/21 - 09/28/21: Patient had a minimum heart rate of 45 bpm, maximum heart rate of 164 bpm, and average heart rate of  69 bpm Predominant underlying rhythm was sinus rhythm. Isolated PACs were rare (<1.0%). Isolated PVCs were rare (<1.0%). No evidence of atrial fibrillation Triggered and diary events associated with sinus rhythm and sinus tachycardia.  No malignant arrhythmias.   TEE 05/28/21: IMPRESSIONS   1. Left ventricular ejection fraction, by estimation, is 60 to 65%. The  left ventricle has normal function. The left ventricle has no regional  wall motion abnormalities.   2. Right ventricular systolic function is normal. The right ventricular  size is normal.   3. No left atrial/left atrial appendage thrombus was detected.   4. The mitral valve is normal in structure. Trivial mitral valve  regurgitation. No evidence of mitral stenosis.   5. The aortic valve is tricuspid. Aortic valve regurgitation is not  visualized. No aortic stenosis is present.   6. The inferior vena cava is normal in size with greater than 50%  respiratory variability, suggesting right atrial pressure of 3 mmHg.   7. Evidence of atrial level shunting detected by color flow Doppler.  Agitated saline contrast bubble study was positive with shunting observed  within 3-6 cardiac cycles suggestive of interatrial shunt. There is a  small patent foramen ovale with  predominantly right to left shunting across the atrial septum.    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HCC)    Left-sided weakness 06/17/2021   Sleep apnea    CPAP   Stroke [facility])     Past Surgical History:  Procedure Laterality Date   BUBBLE STUDY  05/28/2021   Procedure: BUBBLE STUDY;  Surgeon: Raford Riggs, MD;  Location: [facility] ENDOSCOPY;  Service: Cardiovascular;;   DECOMPRESSION FASCIOTOMY FOREARM Right    TEE WITHOUT CARDIOVERSION N/A 05/28/2021   Procedure: TRANSESOPHAGEAL ECHOCARDIOGRAM (TEE);  Surgeon: Raford Riggs, MD;  Location: [facility] ENDOSCOPY;  Service: Cardiovascular;  Laterality: N/A;     MEDICATIONS: No current facility-administered medications for this encounter.    atorvastatin  (LIPITOR ) 80 MG tablet   clopidogrel  (PLAVIX ) 75 MG tablet   escitalopram (LEXAPRO) 10 MG tablet   fluticasone (FLONASE) 50 MCG/ACT nasal spray   gabapentin (NEURONTIN) 300 MG capsule   ibuprofen  (ADVIL ) 200 MG tablet   meclizine  (ANTIVERT ) 25 MG tablet   oxybutynin  (DITROPAN -XL) 5 MG 24 hr tablet    Isaiah Ruder, PA-C Surgical Short Stay/Anesthesiology [facility] Phone [phone] [facility] Phone [phone] 03/14/2024 12:40 PM

## 2024-03-14 NOTE — Progress Notes (Signed)
 SDW. Through the use of [place] interpreter, patient had taken his Plavix  last night, 03/13/2024.  Secure chat with Dr. Vandegriend and patient was suppose to stop Plavix  5 days prior to surgery. He will re-schedule patients surgery.  Instructed patient to call Dr. Bruce office for further instructions.

## 2024-03-15 ENCOUNTER — Ambulatory Visit (HOSPITAL_COMMUNITY): Admission: RE | Admit: 2024-03-15 | Source: Home / Self Care | Admitting: Otolaryngology

## 2024-03-15 ENCOUNTER — Encounter (HOSPITAL_COMMUNITY): Admission: RE | Payer: Self-pay | Source: Home / Self Care

## 2024-03-15 ENCOUNTER — Encounter (HOSPITAL_COMMUNITY): Payer: Self-pay | Admitting: Vascular Surgery

## 2024-03-15 HISTORY — DX: Sleep apnea, unspecified: G47.30

## 2024-03-15 SURGERY — SINUS SURGERY, ENDOSCOPIC
Anesthesia: General | Laterality: Bilateral

## 2024-03-16 ENCOUNTER — Other Ambulatory Visit: Payer: Self-pay | Admitting: Otolaryngology

## 2024-04-11 ENCOUNTER — Other Ambulatory Visit: Payer: Self-pay | Admitting: Otolaryngology

## 2024-04-11 NOTE — Anesthesia Preprocedure Evaluation (Signed)
 Anesthesia Evaluation  Patient identified by MRN, date of birth, ID band Patient awake    Reviewed: Allergy & Precautions, H&P , NPO status , Patient's Chart, lab work & pertinent test results  History of Anesthesia Complications Negative for: history of anesthetic complications  Airway Mallampati: II  TM Distance: >3 FB Neck ROM: Full    Dental  (+) Dental Advisory Given   Pulmonary neg pulmonary ROS, sleep apnea , Patient abstained from smoking., former smoker   Pulmonary exam normal breath sounds clear to auscultation       Cardiovascular negative cardio ROS Normal cardiovascular exam Rhythm:Regular Rate:Normal     Neuro/Psych CVA, Residual Symptoms negative neurological ROS  negative psych ROS   GI/Hepatic negative GI ROS, Neg liver ROS,,,  Endo/Other  negative endocrine ROS  Class 3 obesity  Renal/GU negative Renal ROS  negative genitourinary   Musculoskeletal negative musculoskeletal ROS (+)    Abdominal  (+) + obese  Peds negative pediatric ROS (+)  Hematology negative hematology ROS (+)  On plavix     Anesthesia Other Findings   Reproductive/Obstetrics negative OB ROS                              Anesthesia Physical Anesthesia Plan  ASA: 3  Anesthesia Plan: General   Post-op Pain Management:    Induction: Intravenous  PONV Risk Score and Plan: 2 and Treatment may vary due to age or medical condition, Ondansetron  and Midazolam   Airway Management Planned: Oral ETT  Additional Equipment: None  Intra-op Plan:   Post-operative Plan: Extubation in OR  Informed Consent:   Plan Discussed with: CRNA and Anesthesiologist  Anesthesia Plan Comments: (PAT note written by Isaiah Ruder, PA-C on Date of Service 03/14/2024. 03/15/2024 surgery rescheduled to 04/12/2024 because Plavix  was not held. Last dose reported as 04/01/2024. SABRA  )         Anesthesia Quick  Evaluation

## 2024-04-11 NOTE — Progress Notes (Signed)
 SDW CALL  Patient was given pre-op instructions over the phone. The opportunity was given for the patient to ask questions. No further questions asked. Patient verbalized understanding of instructions given.   PCP - Lucius Krabbe, NP  Cardiologist -   PPM/ICD - denies Device Orders - n/a Rep Notified - n/a  Chest x-ray - 07-06-23 EKG - DOS Stress Test -  ECHO - 05-28-21 Cardiac Cath -   Sleep Study - 07-09-23 thru 08-07-23 CPAP - unable to tolerate  Dm-denies  Blood Thinner Instructions: denies per patient wife last dose of plavix  04-01-24 Aspirin  Instructions:denies  ERAS Protcol - NPO   COVID TEST- n/a   Anesthesia review: yes  HX of stroke, OSA    -------------  SDW INSTRUCTIONS given:  Your procedure is scheduled on April 12, 2024.  Report to [facility] Main Entrance A at 11:00 A.M., and check in at the Admitting office.  Call this number if you have problems the morning of surgery:  [phone]   Remember:  Do not eat or drink  after midnight the night before your surgery   Take these medicines the morning of surgery with A SIP OF WATER  acetaminophen  (TYLENOL )  fluticasone (FLONASE)  atorvastatin  (LIPITOR )    As of today, STOP taking any Aspirin  (unless otherwise instructed by your surgeon) Aleve, Naproxen, Ibuprofen , Motrin , Advil , Goody's, BC's, all herbal medications, fish oil, and all vitamins.                Questions were answered. Patient verbalized understanding of instructions.      Patient denies shortness of breath, fever, cough and chest pain over the phone call   All instructions explained to the patient, with a verbal understanding of the material. Patient agrees to go over the instructions while at home for a better understanding.

## 2024-04-12 ENCOUNTER — Ambulatory Visit (HOSPITAL_COMMUNITY): Payer: Self-pay | Admitting: Vascular Surgery

## 2024-04-12 ENCOUNTER — Encounter (HOSPITAL_COMMUNITY)
Admission: RE | Disposition: A | Discharge status: Home / Self Care | Payer: Self-pay | Source: Home / Self Care | Attending: Otolaryngology

## 2024-04-12 ENCOUNTER — Other Ambulatory Visit: Payer: Self-pay

## 2024-04-12 ENCOUNTER — Encounter (HOSPITAL_COMMUNITY): Payer: Self-pay | Admitting: Otolaryngology

## 2024-04-12 ENCOUNTER — Ambulatory Visit (HOSPITAL_COMMUNITY)
Admission: RE | Admit: 2024-04-12 | Discharge: 2024-04-12 | Disposition: A | Discharge status: Home / Self Care | Attending: Otolaryngology | Admitting: Otolaryngology

## 2024-04-12 DIAGNOSIS — E66813 Obesity, class 3: Secondary | ICD-10-CM | POA: Insufficient documentation

## 2024-04-12 DIAGNOSIS — Z87891 Personal history of nicotine dependence: Secondary | ICD-10-CM | POA: Diagnosis not present

## 2024-04-12 DIAGNOSIS — I69354 Hemiplegia and hemiparesis following cerebral infarction affecting left non-dominant side: Secondary | ICD-10-CM | POA: Insufficient documentation

## 2024-04-12 DIAGNOSIS — J339 Nasal polyp, unspecified: Secondary | ICD-10-CM | POA: Insufficient documentation

## 2024-04-12 DIAGNOSIS — Z6837 Body mass index (BMI) 37.0-37.9, adult: Secondary | ICD-10-CM | POA: Diagnosis not present

## 2024-04-12 DIAGNOSIS — G4730 Sleep apnea, unspecified: Secondary | ICD-10-CM | POA: Insufficient documentation

## 2024-04-12 DIAGNOSIS — Z7902 Long term (current) use of antithrombotics/antiplatelets: Secondary | ICD-10-CM | POA: Diagnosis not present

## 2024-04-12 DIAGNOSIS — J329 Chronic sinusitis, unspecified: Secondary | ICD-10-CM | POA: Insufficient documentation

## 2024-04-12 DIAGNOSIS — G4733 Obstructive sleep apnea (adult) (pediatric): Secondary | ICD-10-CM | POA: Diagnosis not present

## 2024-04-12 HISTORY — PX: NASAL SINUS SURGERY: SHX719

## 2024-04-12 HISTORY — PX: MAXILLARY ANTROSTOMY: SHX2003

## 2024-04-12 LAB — CBC
HCT: 41.5 % (ref 39.0–52.0)
Hemoglobin: 13.9 g/dL (ref 13.0–17.0)
MCH: 28.6 pg (ref 26.0–34.0)
MCHC: 33.5 g/dL (ref 30.0–36.0)
MCV: 85.4 fL (ref 80.0–100.0)
Platelets: 225 K/uL (ref 150–400)
RBC: 4.86 MIL/uL (ref 4.22–5.81)
RDW: 13.4 % (ref 11.5–15.5)
WBC: 8.3 K/uL (ref 4.0–10.5)
nRBC: 0 % (ref 0.0–0.2)

## 2024-04-12 LAB — BASIC METABOLIC PANEL WITH GFR
Anion gap: 6 (ref 5–15)
BUN: 11 mg/dL (ref 6–20)
CO2: 24 mmol/L (ref 22–32)
Calcium: 8.4 mg/dL — ABNORMAL LOW (ref 8.9–10.3)
Chloride: 107 mmol/L (ref 98–111)
Creatinine, Ser: 0.86 mg/dL (ref 0.61–1.24)
GFR, Estimated: >60 mL/min (ref >60)
Glucose, Bld: 81 mg/dL (ref 70–99)
Potassium: 3.7 mmol/L (ref 3.5–5.1)
Sodium: 137 mmol/L (ref 135–145)

## 2024-04-12 SURGERY — Surgical Case
Anesthesia: *Unknown

## 2024-04-12 SURGERY — SINUS SURGERY, ENDOSCOPIC
Anesthesia: General | Site: Nose | Laterality: Bilateral

## 2024-04-12 MED ORDER — SUGAMMADEX SODIUM 200 MG/2ML IV SOLN
INTRAVENOUS | Status: DC | PRN
  Administered 2024-04-12: 200 mg via INTRAVENOUS

## 2024-04-12 MED ORDER — CEFAZOLIN SODIUM-DEXTROSE 2-4 GM/100ML-% IV SOLN
INTRAVENOUS | Status: AC
  Filled 2024-04-12: qty 100

## 2024-04-12 MED ORDER — SODIUM CHLORIDE 0.9 % IV SOLN: 12.5000 mg | INTRAVENOUS | Status: DC | PRN

## 2024-04-12 MED ORDER — OXYCODONE HCL 5 MG PO TABS: 5.0000 mg | ORAL_TABLET | Freq: Once | ORAL | Status: DC | PRN

## 2024-04-12 MED ORDER — MIDAZOLAM HCL 2 MG/2ML IJ SOLN
INTRAMUSCULAR | Status: DC | PRN
  Administered 2024-04-12: 2 mg via INTRAVENOUS

## 2024-04-12 MED ORDER — HEMOSTATIC AGENTS (NO CHARGE) OPTIME
TOPICAL | Status: DC | PRN
  Administered 2024-04-12 (×2): 1 via TOPICAL

## 2024-04-12 MED ORDER — PHENYLEPHRINE 80 MCG/ML (10ML) SYRINGE FOR IV PUSH (FOR BLOOD PRESSURE SUPPORT)
PREFILLED_SYRINGE | INTRAVENOUS | Status: DC | PRN
  Administered 2024-04-12: 40 ug via INTRAVENOUS
  Administered 2024-04-12: 160 ug via INTRAVENOUS

## 2024-04-12 MED ORDER — HYDROMORPHONE HCL 1 MG/ML IJ SOLN: 0.2500 mg | INTRAMUSCULAR | Status: DC | PRN

## 2024-04-12 MED ORDER — OXYCODONE HCL 5 MG/5ML PO SOLN: 5.0000 mg | Freq: Once | ORAL | Status: DC | PRN

## 2024-04-12 MED ORDER — CHLORHEXIDINE GLUCONATE CLOTH 2 % EX PADS: 6.0000 | MEDICATED_PAD | Freq: Once | CUTANEOUS | Status: DC

## 2024-04-12 MED ORDER — SODIUM CHLORIDE 0.9 % IR SOLN
Status: DC | PRN
  Administered 2024-04-12: 1000 mL

## 2024-04-12 MED ORDER — AMISULPRIDE (ANTIEMETIC) 5 MG/2ML IV SOLN: 10.0000 mg | Freq: Once | INTRAVENOUS | Status: DC | PRN

## 2024-04-12 MED ORDER — MEPERIDINE HCL 25 MG/ML IJ SOLN: 6.2500 mg | INTRAMUSCULAR | Status: DC | PRN

## 2024-04-12 MED ORDER — LACTATED RINGERS IV SOLN
INTRAVENOUS | Status: DC
Start: 2024-04-12 — End: 2024-04-12

## 2024-04-12 MED ORDER — 0.9 % SODIUM CHLORIDE (POUR BTL) OPTIME
TOPICAL | Status: DC | PRN
  Administered 2024-04-12: 1000 mL

## 2024-04-12 MED ORDER — BUPIVACAINE-EPINEPHRINE (PF) 0.25% -1:200000 IJ SOLN
INTRAMUSCULAR | Status: AC
  Filled 2024-04-12: qty 30

## 2024-04-12 MED ORDER — OXYMETAZOLINE HCL 0.05 % NA SOLN
NASAL | Status: DC | PRN
  Administered 2024-04-12: 1 via TOPICAL

## 2024-04-12 MED ORDER — ONDANSETRON HCL 4 MG/2ML IJ SOLN
INTRAMUSCULAR | Status: AC
  Filled 2024-04-12: qty 2

## 2024-04-12 MED ORDER — PHENYLEPHRINE 80 MCG/ML (10ML) SYRINGE FOR IV PUSH (FOR BLOOD PRESSURE SUPPORT)
PREFILLED_SYRINGE | INTRAVENOUS | Status: AC
  Filled 2024-04-12: qty 10

## 2024-04-12 MED ORDER — CHLORHEXIDINE GLUCONATE 0.12 % MT SOLN: 15.0000 mL | Freq: Once | OROMUCOSAL | Status: AC

## 2024-04-12 MED ORDER — ACETAMINOPHEN 10 MG/ML IV SOLN
INTRAVENOUS | Status: DC | PRN
  Administered 2024-04-12: 1000 mg via INTRAVENOUS

## 2024-04-12 MED ORDER — FENTANYL CITRATE (PF) 250 MCG/5ML IJ SOLN
INTRAMUSCULAR | Status: DC | PRN
  Administered 2024-04-12 (×2): 50 ug via INTRAVENOUS

## 2024-04-12 MED ORDER — LIDOCAINE-EPINEPHRINE 1 %-1:100000 IJ SOLN
INTRAMUSCULAR | Status: DC | PRN
  Administered 2024-04-12: 20 mL

## 2024-04-12 MED ORDER — PHENYLEPHRINE HCL-NACL 20-0.9 MG/250ML-% IV SOLN
INTRAVENOUS | Status: DC | PRN
  Administered 2024-04-12: 30 ug/min via INTRAVENOUS

## 2024-04-12 MED ORDER — FENTANYL CITRATE (PF) 250 MCG/5ML IJ SOLN
INTRAMUSCULAR | Status: AC
  Filled 2024-04-12: qty 5

## 2024-04-12 MED ORDER — LIDOCAINE-EPINEPHRINE 1 %-1:100000 IJ SOLN
INTRAMUSCULAR | Status: AC
  Filled 2024-04-12: qty 1

## 2024-04-12 MED ORDER — CEFAZOLIN SODIUM-DEXTROSE 2-4 GM/100ML-% IV SOLN
2.0000 g | INTRAVENOUS | Status: AC
  Administered 2024-04-12: 2 g via INTRAVENOUS

## 2024-04-12 MED ORDER — DEXAMETHASONE SODIUM PHOSPHATE 10 MG/ML IJ SOLN
INTRAMUSCULAR | Status: DC | PRN
  Administered 2024-04-12: 10 mg via INTRAVENOUS

## 2024-04-12 MED ORDER — OXYMETAZOLINE HCL 0.05 % NA SOLN
NASAL | Status: AC
  Filled 2024-04-12: qty 30

## 2024-04-12 MED ORDER — LIDOCAINE 2% (20 MG/ML) 5 ML SYRINGE
INTRAMUSCULAR | Status: DC | PRN
  Administered 2024-04-12: 100 mg via INTRAVENOUS

## 2024-04-12 MED ORDER — ROCURONIUM BROMIDE 10 MG/ML (PF) SYRINGE
PREFILLED_SYRINGE | INTRAVENOUS | Status: DC | PRN
  Administered 2024-04-12: 20 mg via INTRAVENOUS
  Administered 2024-04-12 (×2): 10 mg via INTRAVENOUS
  Administered 2024-04-12: 50 mg via INTRAVENOUS

## 2024-04-12 MED ORDER — DEXAMETHASONE SODIUM PHOSPHATE 10 MG/ML IJ SOLN
INTRAMUSCULAR | Status: AC
  Filled 2024-04-12: qty 1

## 2024-04-12 MED ORDER — CEFAZOLIN SODIUM-DEXTROSE 2-4 GM/100ML-% IV SOLN: 2.0000 g | INTRAVENOUS | Status: DC

## 2024-04-12 MED ORDER — PROPOFOL 10 MG/ML IV BOLUS
INTRAVENOUS | Status: DC | PRN
  Administered 2024-04-12: 50 mg via INTRAVENOUS
  Administered 2024-04-12: 150 mg via INTRAVENOUS

## 2024-04-12 MED ORDER — GLYCOPYRROLATE 0.2 MG/ML IJ SOLN
INTRAMUSCULAR | Status: DC | PRN
  Administered 2024-04-12: .2 mg via INTRAVENOUS

## 2024-04-12 MED ORDER — ORAL CARE MOUTH RINSE: 15.0000 mL | Freq: Once | OROMUCOSAL | Status: AC

## 2024-04-12 MED ORDER — MIDAZOLAM HCL 2 MG/2ML IJ SOLN
INTRAMUSCULAR | Status: AC
  Filled 2024-04-12: qty 2

## 2024-04-12 MED ORDER — CHLORHEXIDINE GLUCONATE 0.12 % MT SOLN
OROMUCOSAL | Status: AC
  Administered 2024-04-12: 15 mL via OROMUCOSAL
  Filled 2024-04-12: qty 15

## 2024-04-12 MED ORDER — ONDANSETRON HCL 4 MG/2ML IJ SOLN
INTRAMUSCULAR | Status: DC | PRN
  Administered 2024-04-12: 4 mg via INTRAVENOUS

## 2024-04-12 SURGICAL SUPPLY — 44 items
BAG COUNTER SPONGE SURGICOUNT (BAG) ×1 IMPLANT
BLADE RAD40 ROTATE 4M 4 5PK (BLADE) IMPLANT
BLADE RAD60 ROTATE M4 4 5PK (BLADE) IMPLANT
BLADE ROTATE TRICUT 4X13 M4 (BLADE) ×1 IMPLANT
BLADE TRICUT ROTATE M4 4 5PK (BLADE) ×1 IMPLANT
CANISTER SUCTION 3000ML PPV (SUCTIONS) ×1 IMPLANT
CLSR STERI-STRIP ANTIMIC 1/2X4 (GAUZE/BANDAGES/DRESSINGS) ×1 IMPLANT
CNTNR URN SCR LID CUP LEK RST (MISCELLANEOUS) IMPLANT
COAGULATOR SUCT SWTCH 10FR 6 (ELECTROSURGICAL) IMPLANT
DRAPE HALF SHEET 40X57 (DRAPES) IMPLANT
DRESSING NASAL POPE 10X1.5X2.5 (GAUZE/BANDAGES/DRESSINGS) IMPLANT
DRSG NASOPORE 8CM (GAUZE/BANDAGES/DRESSINGS) IMPLANT
DRSG TELFA 3X8 NADH STRL (GAUZE/BANDAGES/DRESSINGS) IMPLANT
ELECTRODE REM PT RTRN 9FT ADLT (ELECTROSURGICAL) IMPLANT
GLOVE BIO SURGEON STRL SZ7 (GLOVE) ×1 IMPLANT
GOWN STRL REUS W/ TWL LRG LVL3 (GOWN DISPOSABLE) ×2 IMPLANT
KIT BASIN OR (CUSTOM PROCEDURE TRAY) ×1 IMPLANT
KIT TURNOVER KIT B (KITS) ×1 IMPLANT
NDL HYPO 25GX1X1/2 BEV (NEEDLE) IMPLANT
NDL PRECISIONGLIDE 27X1.5 (NEEDLE) ×1 IMPLANT
NDL SPNL 22GX3.5 QUINCKE BK (NEEDLE) ×1 IMPLANT
NEEDLE HYPO 25GX1X1/2 BEV (NEEDLE) IMPLANT
NEEDLE PRECISIONGLIDE 27X1.5 (NEEDLE) ×1 IMPLANT
NEEDLE SPNL 22GX3.5 QUINCKE BK (NEEDLE) ×1 IMPLANT
NS IRRIG 1000ML POUR BTL (IV SOLUTION) ×1 IMPLANT
PAD ARMBOARD POSITIONER FOAM (MISCELLANEOUS) ×2 IMPLANT
PAD ENT ADHESIVE 25PK (MISCELLANEOUS) ×1 IMPLANT
PATTIES SURGICAL .5 X3 (DISPOSABLE) ×1 IMPLANT
POSITIONER HEAD DONUT 9IN (MISCELLANEOUS) IMPLANT
SHEATH ENDOSCRUB 0 DEG (SHEATH) ×1 IMPLANT
SHEATH ENDOSCRUB 30 DEG (SHEATH) ×1 IMPLANT
SHEATH ENDOSCRUB 45 DEG (SHEATH) IMPLANT
SOL ANTI FOG 6CC (MISCELLANEOUS) ×1 IMPLANT
SPLINT NASAL POSISEP X .6X2 (GAUZE/BANDAGES/DRESSINGS) IMPLANT
SPLINT NASAL POSISEP X2 .8X2.3 (GAUZE/BANDAGES/DRESSINGS) IMPLANT
SUT ETHILON 3 0 PS 1 (SUTURE) IMPLANT
SWAB COLLECTION DEVICE MRSA (MISCELLANEOUS) IMPLANT
SYR 50ML SLIP (SYRINGE) IMPLANT
TOWEL GREEN STERILE FF (TOWEL DISPOSABLE) ×1 IMPLANT
TRACKER ENT INSTRUMENT (MISCELLANEOUS) ×1 IMPLANT
TRACKER ENT PATIENT (MISCELLANEOUS) ×1 IMPLANT
TRAY ENT MC OR (CUSTOM PROCEDURE TRAY) ×1 IMPLANT
TUBE CONNECTING 12X1/4 (SUCTIONS) ×1 IMPLANT
TUBING STRAIGHTSHOT EPS 5PK (TUBING) IMPLANT

## 2024-04-12 NOTE — Discharge Instructions (Addendum)
 OK to restart Plavix  in 2 days.

## 2024-04-12 NOTE — H&P (Signed)
 Ernest [name] is an 38 y.o. male.    Chief Complaint:  Nasal obstruction, sinusitis  HPI: Patient presents today for planned elective procedure.  He denies any interval change in history since office visit  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HCC)    Left-sided weakness 06/17/2021   Sleep apnea    CPAP   Stroke [facility])     Past Surgical History:  Procedure Laterality Date   BUBBLE STUDY  05/28/2021   Procedure: BUBBLE STUDY;  Surgeon: Raford Riggs, MD;  Location: [facility] ENDOSCOPY;  Service: Cardiovascular;;   DECOMPRESSION FASCIOTOMY FOREARM Right    TEE WITHOUT CARDIOVERSION N/A 05/28/2021   Procedure: TRANSESOPHAGEAL ECHOCARDIOGRAM (TEE);  Surgeon: Raford Riggs, MD;  Location: [facility] ENDOSCOPY;  Service: Cardiovascular;  Laterality: N/A;    Family History  Problem Relation Age of Onset   Diabetes Neg Hx    Cancer Neg Hx    Heart failure Neg Hx    Hyperlipidemia Neg Hx    Hypertension Neg Hx     Social History:  reports that he quit smoking about 2 years ago. His smoking use included cigarettes. He started smoking about 18 years ago. He has a 0.2 pack-year smoking history. He has never used smokeless tobacco. He reports that he does not drink alcohol and does not use drugs.  Allergies: No Known Allergies  Medications Prior to Admission  Medication Sig Dispense Refill   acetaminophen  (TYLENOL ) 500 MG tablet Take 500-1,000 mg by mouth every 6 (six) hours as needed for mild pain (pain score 1-3).     atorvastatin  (LIPITOR ) 80 MG tablet TAKE 1 TABLET(80 MG) BY MOUTH DAILY 90 tablet 1   clopidogrel  (PLAVIX ) 75 MG tablet TAKE 1 TABLET(75 MG) BY MOUTH DAILY 90 tablet 0   fluticasone (FLONASE) 50 MCG/ACT nasal spray Place 2 sprays into both nostrils daily.     escitalopram (LEXAPRO) 10 MG tablet Take 10 mg by mouth daily. (Patient not taking: Reported on 04/10/2024)     gabapentin (NEURONTIN) 300 MG capsule Take 300 mg by mouth daily.  (Patient not taking: Reported on 04/10/2024)     meclizine  (ANTIVERT ) 25 MG tablet Take 1 tablet (25 mg total) by mouth 3 (three) times daily as needed for dizziness. (Patient not taking: Reported on 04/10/2024) 30 tablet 0    No results found for this or any previous visit (from the past 48 hours). No results found.  ROS: negative other than stated in HPI  Blood pressure 136/88, pulse 65, temperature 98.2 F (36.8 C), temperature source Oral, resp. rate 20, height 5' 3 (1.6 m), weight 95.3 kg, SpO2 96%.  PHYSICAL EXAM: General: Resting comfortably in NAD  Lungs: Non-labored respirations  Assessment/Plan  Chronic sinusitis/Nasal polyps - Plan bilateral endoscopic sinus surgery. Risks/benefits discussed and all questions answered with the use of a Nepali translator  Zach Ireene Ballowe MD  04/12/2024, 11:57 AM

## 2024-04-12 NOTE — Transfer of Care (Signed)
 Immediate Anesthesia Transfer of Care Note  Patient: Ernest [name]  Procedure(s) Performed: SINUS SURGERY, ENDOSCOPIC (Bilateral: Nose) MAXILLARY ANTROSTOMY (Bilateral: Nose)  Patient Location: PACU  Anesthesia Type:General  Level of Consciousness: drowsy  Airway & Oxygen Therapy: Patient Spontanous Breathing and Patient connected to face mask oxygen  Post-op Assessment: Report given to RN and Post -op Vital signs reviewed and stable  Post vital signs: Reviewed and stable  Last Vitals:  Vitals Value Taken Time  BP 127/80 1644  Temp    Pulse 79 04/12/24 16:44  Resp 17   SpO2 100 % 04/12/24 16:44  Vitals shown include unfiled device data.  Last Pain:  Vitals:   04/12/24 1139  TempSrc:   PainSc: 0-No pain         Complications: No notable events documented.

## 2024-04-12 NOTE — Anesthesia Procedure Notes (Signed)
 Procedure Name: Intubation Date/Time: 04/12/2024 2:01 PM  Performed by: Cindie Donald CROME, CRNAPre-anesthesia Checklist: Patient identified, Emergency Drugs available, Suction available and Patient being monitored Patient Re-evaluated:Patient Re-evaluated prior to induction Oxygen Delivery Method: Circle System Utilized Preoxygenation: Pre-oxygenation with 100% oxygen Induction Type: IV induction Ventilation: Mask ventilation without difficulty Laryngoscope Size: Miller and 2 Grade View: Grade I Tube type: Oral Tube size: 7.5 mm Number of attempts: 1 Airway Equipment and Method: Stylet and Oral airway Placement Confirmation: ETT inserted through vocal cords under direct vision, positive ETCO2 and breath sounds checked- equal and bilateral Tube secured with: Tape Dental Injury: Teeth and Oropharynx as per pre-operative assessment  Comments: Intubated by Jama Done CRNA.

## 2024-04-12 NOTE — Op Note (Signed)
 OPERATIVE NOTE  Ernest [name] Date/Time of Admission: 04/12/2024 11:07 AM  CSN: [id number];MRN:[id number] Attending Provider: Maggie Hussar, MD Room/Bed: MCPO/NONE DOB: 1986-03-15 Age: 38 y.o.   Pre-Op Diagnosis: Nasal polyposis Chronic sinusitis  Post-Op Diagnosis: Nasal polyposis Chronic sinusitis  Procedure: Procedure(s): Bilateral ethmoidectomy Bilateral frontal sinusotomy Bilateral sphenoidotomy Bilateral maxillary antrostom Stereotactic navigation  Anesthesia: General  Surgeon(s): Zach Trey Gulbranson, MD  Staff: Circulator: Bari Birmingham, RN Scrub Person: Perez-Vasquez, Tiffany  Implants: * No implants in log *  Specimens: ID Type Source Tests Collected by Time Destination  1 : bilateral sinus contents Tissue PATH ENT excision SURGICAL PATHOLOGY Maggie Hussar, MD 04/12/2024 1549     Complications: none  EBL: 400 ML  Condition: stable  Operative Findings:  Moderate bilateral nasal polyps, thick mucinous debris  Description of Operation: After informed consent was obtained the patient was brought back to the operating room and intubated by anesthesia.  The stereotactic navigation was connected and the accuracy was confirmed after tracing.  The nose was then decongested with Afrin-soaked cottonoids and the rigid middle turbinate and sphenopalatine area injected with 1% lidocaine  with 1 100,000 epinephrine .  Then using a 0 degree endoscope starting on the right side the moderate polyps were debulked.  The middle turbinate was gently medialized and the ethmoid air cells were removed with through-cutting instrumentation and a microdebrider.  The lateral nasal wall and the medial maxillary wall were somewhat hyperostotic and the posterior fontanelle approach was used and the maxillary sinus was widely opened with a backbiter.  Next through a trans ethmoid approach the sphenoid sinus was then entered with a probe and enlarged using through-cutting  instrumentation.  The frontal region was then evaluated using a 30 degree endoscope and remaining air cells around this were removed and a probe placed into the frontal sinus followed by a suction.  This was enlarged with the suction and through-cutting instrumentation.  There was thick mucinous debris in the sinus which was suctioned.  The lower half of the middle turbinate was then resected as this was forming polyps.  PosiSep absorbable packing was then placed into the nasal cavity for mild bleeding.  Attention was then turned to the left side  and moderate polyps were debulked.  The middle turbinate was gently medialized and the ethmoid air cells were removed with through-cutting instrumentation and a microdebrider.  The lateral nasal wall and the medial maxillary wall were somewhat hyperostotic and the posterior fontanelle approach was used and the maxillary sinus was widely opened with a backbiter.  Next through a trans ethmoid approach the sphenoid sinus was then entered with a probe and enlarged using through-cutting instrumentation.  The frontal region was then evaluated using a 30 degree endoscope and remaining air cells around this were removed and a probe placed into the frontal sinus followed by a suction.  This was enlarged with the suction and through-cutting instrumentation.  There was thick mucinous debris in the sinus which was suctioned.  The lower half of the middle turbinate was then resected as this was forming polyps.  PosiSep absorbable packing was then placed into the nasal cavity for mild bleeding.  There was minimal bleeding at the conclusion of the case and the patient was then turned over to anesthesia in stable condition.  [name] ENT

## 2024-04-13 ENCOUNTER — Encounter (HOSPITAL_COMMUNITY): Payer: Self-pay | Admitting: Otolaryngology

## 2024-04-13 NOTE — Anesthesia Postprocedure Evaluation (Addendum)
 Anesthesia Post Note  Patient: Ernest [name]  Procedure(s) Performed: SINUS SURGERY, ENDOSCOPIC (Bilateral: Nose) MAXILLARY ANTROSTOMY (Bilateral: Nose)     Patient location during evaluation: PACU Anesthesia Type: General Level of consciousness: awake and alert Pain management: pain level controlled Vital Signs Assessment: post-procedure vital signs reviewed and stable Respiratory status: spontaneous breathing, nonlabored ventilation and respiratory function stable Cardiovascular status: blood pressure returned to baseline and stable Postop Assessment: no apparent nausea or vomiting Anesthetic complications: no   No notable events documented.  Last Vitals:  Vitals:   04/12/24 1652 04/12/24 1700  BP:  127/84  Pulse:  77  Resp:  14  Temp:    SpO2: 97% 95%    Last Pain:  Vitals:   04/12/24 1645  TempSrc:   PainSc: Asleep                 Butler Levander Pinal

## 2024-04-14 LAB — SURGICAL PATHOLOGY
